# Patient Record
Sex: Female | Born: 1940 | Race: White | Hispanic: No | Smoking: Former smoker
Health system: Southern US, Community
[De-identification: ages and names within clinical notes are randomized; demographics above are authoritative.]

## PROBLEM LIST (undated history)

## (undated) DIAGNOSIS — C7A8 Other malignant neuroendocrine tumors: Secondary | ICD-10-CM

## (undated) DIAGNOSIS — C7B8 Other secondary neuroendocrine tumors: Secondary | ICD-10-CM

## (undated) DIAGNOSIS — K5792 Diverticulitis of intestine, part unspecified, without perforation or abscess without bleeding: Secondary | ICD-10-CM

---

## 2013-07-02 ENCOUNTER — Emergency Department (HOSPITAL_COMMUNITY): Payer: Medicare HMO

## 2013-07-02 ENCOUNTER — Other Ambulatory Visit: Payer: Self-pay

## 2013-07-02 ENCOUNTER — Inpatient Hospital Stay (HOSPITAL_COMMUNITY)
Admission: EM | Admit: 2013-07-02 | Discharge: 2013-07-11 | DRG: 981 | Disposition: A | Discharge status: Home Health | Payer: Medicare HMO | Attending: Neurosurgery | Admitting: Neurosurgery

## 2013-07-02 ENCOUNTER — Encounter (HOSPITAL_COMMUNITY): Payer: Self-pay | Admitting: Emergency Medicine

## 2013-07-02 DIAGNOSIS — D61818 Other pancytopenia: Secondary | ICD-10-CM

## 2013-07-02 DIAGNOSIS — Z87891 Personal history of nicotine dependence: Secondary | ICD-10-CM

## 2013-07-02 DIAGNOSIS — D649 Anemia, unspecified: Secondary | ICD-10-CM | POA: Diagnosis not present

## 2013-07-02 DIAGNOSIS — I10 Essential (primary) hypertension: Secondary | ICD-10-CM | POA: Diagnosis present

## 2013-07-02 DIAGNOSIS — J9600 Acute respiratory failure, unspecified whether with hypoxia or hypercapnia: Secondary | ICD-10-CM

## 2013-07-02 DIAGNOSIS — R7309 Other abnormal glucose: Secondary | ICD-10-CM | POA: Diagnosis not present

## 2013-07-02 DIAGNOSIS — C7800 Secondary malignant neoplasm of unspecified lung: Secondary | ICD-10-CM | POA: Diagnosis present

## 2013-07-02 DIAGNOSIS — C719 Malignant neoplasm of brain, unspecified: Secondary | ICD-10-CM

## 2013-07-02 DIAGNOSIS — C787 Secondary malignant neoplasm of liver and intrahepatic bile duct: Secondary | ICD-10-CM | POA: Diagnosis present

## 2013-07-02 DIAGNOSIS — R0602 Shortness of breath: Secondary | ICD-10-CM | POA: Diagnosis present

## 2013-07-02 DIAGNOSIS — Z79899 Other long term (current) drug therapy: Secondary | ICD-10-CM

## 2013-07-02 DIAGNOSIS — R1310 Dysphagia, unspecified: Secondary | ICD-10-CM | POA: Diagnosis not present

## 2013-07-02 DIAGNOSIS — C781 Secondary malignant neoplasm of mediastinum: Secondary | ICD-10-CM | POA: Diagnosis present

## 2013-07-02 DIAGNOSIS — R918 Other nonspecific abnormal finding of lung field: Secondary | ICD-10-CM | POA: Diagnosis present

## 2013-07-02 DIAGNOSIS — J438 Other emphysema: Secondary | ICD-10-CM | POA: Diagnosis present

## 2013-07-02 DIAGNOSIS — C7970 Secondary malignant neoplasm of unspecified adrenal gland: Secondary | ICD-10-CM | POA: Diagnosis present

## 2013-07-02 DIAGNOSIS — C3430 Malignant neoplasm of lower lobe, unspecified bronchus or lung: Principal | ICD-10-CM | POA: Diagnosis present

## 2013-07-02 DIAGNOSIS — T380X5A Adverse effect of glucocorticoids and synthetic analogues, initial encounter: Secondary | ICD-10-CM | POA: Diagnosis not present

## 2013-07-02 DIAGNOSIS — C779 Secondary and unspecified malignant neoplasm of lymph node, unspecified: Secondary | ICD-10-CM | POA: Diagnosis present

## 2013-07-02 DIAGNOSIS — E43 Unspecified severe protein-calorie malnutrition: Secondary | ICD-10-CM | POA: Insufficient documentation

## 2013-07-02 DIAGNOSIS — G911 Obstructive hydrocephalus: Secondary | ICD-10-CM | POA: Diagnosis present

## 2013-07-02 DIAGNOSIS — IMO0002 Reserved for concepts with insufficient information to code with codable children: Secondary | ICD-10-CM

## 2013-07-02 DIAGNOSIS — E46 Unspecified protein-calorie malnutrition: Secondary | ICD-10-CM

## 2013-07-02 DIAGNOSIS — I871 Compression of vein: Secondary | ICD-10-CM | POA: Diagnosis present

## 2013-07-02 DIAGNOSIS — I498 Other specified cardiac arrhythmias: Secondary | ICD-10-CM | POA: Diagnosis present

## 2013-07-02 DIAGNOSIS — C7949 Secondary malignant neoplasm of other parts of nervous system: Secondary | ICD-10-CM

## 2013-07-02 DIAGNOSIS — C799 Secondary malignant neoplasm of unspecified site: Secondary | ICD-10-CM | POA: Diagnosis present

## 2013-07-02 DIAGNOSIS — E871 Hypo-osmolality and hyponatremia: Secondary | ICD-10-CM | POA: Diagnosis present

## 2013-07-02 DIAGNOSIS — Z8719 Personal history of other diseases of the digestive system: Secondary | ICD-10-CM

## 2013-07-02 DIAGNOSIS — C7931 Secondary malignant neoplasm of brain: Secondary | ICD-10-CM | POA: Diagnosis present

## 2013-07-02 DIAGNOSIS — R000 Tachycardia, unspecified: Secondary | ICD-10-CM | POA: Diagnosis present

## 2013-07-02 DIAGNOSIS — J439 Emphysema, unspecified: Secondary | ICD-10-CM | POA: Diagnosis present

## 2013-07-02 DIAGNOSIS — G936 Cerebral edema: Secondary | ICD-10-CM | POA: Diagnosis present

## 2013-07-02 HISTORY — DX: Diverticulitis of intestine, part unspecified, without perforation or abscess without bleeding: K57.92

## 2013-07-02 LAB — COMPREHENSIVE METABOLIC PANEL
ALT: 27 U/L (ref 0–35)
AST: 85 U/L — ABNORMAL HIGH (ref 0–37)
Albumin: 3.7 g/dL (ref 3.5–5.2)
Alkaline Phosphatase: 260 U/L — ABNORMAL HIGH (ref 39–117)
BUN: 20 mg/dL (ref 6–23)
CALCIUM: 11.4 mg/dL — AB (ref 8.4–10.5)
CO2: 21 mEq/L (ref 19–32)
Chloride: 90 mEq/L — ABNORMAL LOW (ref 96–112)
Creatinine, Ser: 0.73 mg/dL (ref 0.50–1.10)
GFR calc Af Amer: >90 mL/min (ref >90)
GFR calc non Af Amer: 82 mL/min — ABNORMAL LOW (ref >90)
Glucose, Bld: 96 mg/dL (ref 70–99)
POTASSIUM: 4.8 meq/L (ref 3.7–5.3)
Sodium: 131 mEq/L — ABNORMAL LOW (ref 137–147)
TOTAL PROTEIN: 8.2 g/dL (ref 6.0–8.3)
Total Bilirubin: 0.6 mg/dL (ref 0.3–1.2)

## 2013-07-02 LAB — CBC WITH DIFFERENTIAL/PLATELET
BASOS PCT: 0 % (ref 0–1)
Basophils Absolute: 0 10*3/uL (ref 0.0–0.1)
EOS ABS: 0.1 10*3/uL (ref 0.0–0.7)
EOS PCT: 1 % (ref 0–5)
HEMATOCRIT: 36.8 % (ref 36.0–46.0)
Hemoglobin: 12.9 g/dL (ref 12.0–15.0)
LYMPHS ABS: 1.8 10*3/uL (ref 0.7–4.0)
Lymphocytes Relative: 14 % (ref 12–46)
MCH: 30.4 pg (ref 26.0–34.0)
MCHC: 35.1 g/dL (ref 30.0–36.0)
MCV: 86.8 fL (ref 78.0–100.0)
MONOS PCT: 14 % — AB (ref 3–12)
Monocytes Absolute: 1.7 10*3/uL — ABNORMAL HIGH (ref 0.1–1.0)
NEUTROS PCT: 71 % (ref 43–77)
Neutro Abs: 9 10*3/uL — ABNORMAL HIGH (ref 1.7–7.7)
Platelets: 416 10*3/uL — ABNORMAL HIGH (ref 150–400)
RBC: 4.24 MIL/uL (ref 3.87–5.11)
RDW: 13.8 % (ref 11.5–15.5)
WBC: 12.6 10*3/uL — AB (ref 4.0–10.5)

## 2013-07-02 LAB — LIPASE, BLOOD: LIPASE: 73 U/L — AB (ref 11–59)

## 2013-07-02 LAB — I-STAT TROPONIN, ED: TROPONIN I, POC: 0 ng/mL (ref 0.00–0.08)

## 2013-07-02 LAB — PROTIME-INR
INR: 1.07 (ref 0.00–1.49)
PROTHROMBIN TIME: 13.7 s (ref 11.6–15.2)

## 2013-07-02 MED ORDER — IOHEXOL 350 MG/ML SOLN
80.0000 mL | Freq: Once | INTRAVENOUS | Status: AC | PRN
  Administered 2013-07-02: 80 mL via INTRAVENOUS

## 2013-07-02 MED ORDER — SODIUM CHLORIDE 0.9 % IJ SOLN
3.0000 mL | Freq: Two times a day (BID) | INTRAMUSCULAR | Status: DC
  Administered 2013-07-04 – 2013-07-11 (×12): 3 mL via INTRAVENOUS

## 2013-07-02 MED ORDER — SODIUM CHLORIDE 0.9 % IV SOLN
INTRAVENOUS | Status: AC
  Administered 2013-07-02 – 2013-07-03 (×2): via INTRAVENOUS

## 2013-07-02 MED ORDER — ONDANSETRON HCL 4 MG PO TABS
4.0000 mg | ORAL_TABLET | Freq: Four times a day (QID) | ORAL | Status: DC | PRN
  Administered 2013-07-04 – 2013-07-10 (×2): 4 mg via ORAL
  Filled 2013-07-02 (×2): qty 1

## 2013-07-02 MED ORDER — HYDROCODONE-ACETAMINOPHEN 5-325 MG PO TABS: 1.0000 | ORAL_TABLET | ORAL | Status: DC | PRN

## 2013-07-02 MED ORDER — ALUM & MAG HYDROXIDE-SIMETH 200-200-20 MG/5ML PO SUSP
30.0000 mL | Freq: Four times a day (QID) | ORAL | Status: DC | PRN
Start: 2013-07-02 — End: 2013-07-11

## 2013-07-02 MED ORDER — PANTOPRAZOLE SODIUM 40 MG PO TBEC
40.0000 mg | DELAYED_RELEASE_TABLET | Freq: Two times a day (BID) | ORAL | Status: DC
  Administered 2013-07-02 – 2013-07-06 (×8): 40 mg via ORAL
  Filled 2013-07-02 (×10): qty 1

## 2013-07-02 MED ORDER — SODIUM CHLORIDE 0.9 % IV BOLUS (SEPSIS)
1000.0000 mL | Freq: Once | INTRAVENOUS | Status: AC
  Administered 2013-07-02: 1000 mL via INTRAVENOUS

## 2013-07-02 MED ORDER — BUDESONIDE-FORMOTEROL FUMARATE 160-4.5 MCG/ACT IN AERO
2.0000 | INHALATION_SPRAY | Freq: Two times a day (BID) | RESPIRATORY_TRACT | Status: DC
  Administered 2013-07-02 – 2013-07-11 (×16): 2 via RESPIRATORY_TRACT
  Filled 2013-07-02 (×3): qty 6

## 2013-07-02 MED ORDER — ONDANSETRON HCL 4 MG/2ML IJ SOLN
4.0000 mg | Freq: Four times a day (QID) | INTRAMUSCULAR | Status: DC | PRN
  Administered 2013-07-04 – 2013-07-11 (×3): 4 mg via INTRAVENOUS
  Filled 2013-07-02 (×3): qty 2

## 2013-07-02 MED ORDER — SODIUM CHLORIDE 0.9 % IV SOLN: INTRAVENOUS | Status: DC

## 2013-07-02 NOTE — ED Notes (Signed)
EMTALA completed

## 2013-07-02 NOTE — ED Notes (Signed)
PT transported via [facility] at this time. Report given to transport.

## 2013-07-02 NOTE — H&P (Signed)
PCP:   [name], MD   Chief Complaint:  sob  HPI: 73 yo female overall healthy comes in with over a month of sob really worse in the last couple of days.  She has lost a lot of weight in last couple of months, not eating well due to lack of appetite and when she eats it makes her sob worse.  No fevers.  Her pcp ordered ct scan in the last week which showed a large lung mass and was told if her sob got worse to come to ED.  Denies any hemoptysis.  No le swelling or edema.  Is set up to see oncology as outpt.  Denies pain.  Review of Systems:  Positive and negative as per HPI otherwise all other systems are negative  Past Medical History: Past Medical History  Diagnosis Date  . Diverticulitis    History reviewed. No pertinent past surgical history.  Medications: Prior to Admission medications   Medication Sig Start Date End Date Taking? Authorizing Provider  budesonide-formoterol (SYMBICORT) 160-4.5 MCG/ACT inhaler Inhale 2 puffs into the lungs See admin instructions. Every 9 hours.   Yes Historical Provider, MD  dextromethorphan-guaiFENesin (MUCINEX DM) 30-600 MG per 12 hr tablet Take 1 tablet by mouth 2 (two) times daily.   Yes Historical Provider, MD  Ferrous Sulfate (IRON) 325 (65 FE) MG TABS Take 1 tablet by mouth 3 (three) times daily.   Yes Historical Provider, MD  pantoprazole (PROTONIX) 40 MG tablet Take 40 mg by mouth 2 (two) times daily.   Yes Historical Provider, MD  azithromycin (ZITHROMAX) 250 MG tablet Take 250-500 mg by mouth daily. Take two tablets on day 1 and 1 tablet for the next 4 days.    Historical Provider, MD    Allergies:  No Known Allergies  Social History:  reports that she has quit smoking. She does not have any smokeless tobacco history on file. Her alcohol and drug histories are not on file.  Family History: History reviewed. No pertinent family history.  Physical Exam: Filed Vitals:   07/02/13 1538 07/02/13 1615 07/02/13 1730  BP:  117/70 141/78 163/70  Pulse: 128  108  Temp: 97.9 F (36.6 C)    TempSrc: Oral    Resp: 22 34 22  Weight: 42.638 kg (94 lb)    SpO2: 96%  99%   General appearance: alert, cooperative and no distress Head: Normocephalic, without obvious abnormality, atraumatic Eyes: negative Nose: Nares normal. Septum midline. Mucosa normal. No drainage or sinus tenderness. Neck: no JVD and supple, symmetrical, trachea midline Lungs: clear to auscultation bilaterally Heart: regular rate and rhythm, S1, S2 normal, no murmur, click, rub or gallop Abdomen: soft, non-tender; bowel sounds normal; no masses,  no organomegaly Extremities: extremities normal, atraumatic, no cyanosis or edema Pulses: 2+ and symmetric Skin: Skin color, texture, turgor normal. No rashes or lesions Neurologic: Grossly normal    Labs on Admission:   Recent Labs  07/02/13 1541  NA 131*  K 4.8  CL 90*  CO2 21  GLUCOSE 96  BUN 20  CREATININE 0.73  CALCIUM 11.4*    Recent Labs  07/02/13 1541  AST 85*  ALT 27  ALKPHOS 260*  BILITOT 0.6  PROT 8.2  ALBUMIN 3.7    Recent Labs  07/02/13 1541  LIPASE 73*    Recent Labs  07/02/13 1541  WBC 12.6*  NEUTROABS 9.0*  HGB 12.9  HCT 36.8  MCV 86.8  PLT 416*    Radiological Exams on  Admission: Ct Angio Chest Pe W/cm &/or Wo Cm  07/02/2013   CLINICAL DATA Increased shortness of breath over the last 2 days.  Lung mass.  EXAM CT ANGIOGRAPHY CHEST WITH CONTRAST  TECHNIQUE Multidetector CT imaging of the chest was performed using the standard protocol during bolus administration of intravenous contrast. Multiplanar CT image reconstructions and MIPs were obtained to evaluate the vascular anatomy.  CONTRAST 22mL OMNIPAQUE IOHEXOL 350 MG/ML SOLN  COMPARISON Cancer prior  FINDINGS There is no acute vascular abnormality. Aortic and coronary artery atherosclerosis. Coronary artery atherosclerosis is present. If office based assessment of coronary risk factors has not been  performed, it is now recommended. No pulmonary embolism is identified.  There is mass effect on the pulmonary arteries associated with mediastinal adenopathy however there is no occlusion. Right hilar adenopathy occludes the low right lower lobe bronchi. There is a posterior medial basal right lower lobe mass likely representing a primary tumor measuring 5 cm x 8 cm. Mediastinal adenopathy is present with the dumbbell-shaped subcarinal node did measures 4 cm AP x 3 cm transverse. Prominent right hilar adenopathy is present. Right hilar lymph nodes are invading the right inferior pulmonary vein with areas of tumor thrombus along the superior wall of the pulmonary vein. Metastatic disease to the liver is present with large right hepatic lobe metastases, with the largest measuring 9 cm AP x 5.5 cm transverse.  Both adrenal glands appear enlarged, suspicious for adrenal metastasis. There is no pericardial effusion. No pleural effusion. Emphysema in the lungs. Micronodularity is present in the right lower lobe, likely representing endobronchial spread of tumor. Bilateral pleural apical scarring. There is no mass or convincing evidence metastasis in the left lung. No axillary adenopathy. The IVC is displaced anteriorly by peritracheal adenopathy, without complete occlusion. The left brachiocephalic vein is also displaced anteriorly. Osteopenia. No thoracic compression fractures. Mediastinal adenopathy extends up to the esophagus and could be associated with dysphagia.  Review of the MIP images confirms the above findings.  IMPRESSION 1. No acute abnormality. 2. Right lower lobe mass is favored to represent primary bronchogenic carcinoma with subcarinal/mediastinal and right hilar adenopathy. Invasion of the right inferior pulmonary vein. Mass effect on the right-greater-than-left pulmonary artery and right lower lobe bronchi. 3. Hepatic metastases. 4. Right-sided pulmonary nodules compatible with metastatic disease. 5.  Mass effect on the left brachiocephalic vein and superior vena cava without occlusion. 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 6. 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  SIGNATURE  Electronically Signed   By: Dereck Ligas M.D.   On: 07/02/2013 17:38    Assessment/Plan  73 yo female with progressive worsening sob/doe with new rll lung mass with probable metastatic disease  Principal Problem:   SOB (shortness of breath)-  Pt will be transferred to [facility] and kept npo after midnight for possible bx by pulm tomorrow.  Oncology also will see at [facility].  o2 sats good.  Place on tele.  ??svc syndrome, further w/u per pulm team.  Will hold dvt proph for possible bx in am.  Active Problems:   Lung mass   Metastasis   Sinus tachycardia   Hyponatremia   Metastatic lung carcinoma  FULL CODE discussed with pt and daughter.  Rachael Wall A 07/02/2013, 7:42 PM

## 2013-07-02 NOTE — ED Notes (Signed)
Ambulating spo2 97% HR increase from 114 to 130 pt c/o weakness while ambulating

## 2013-07-02 NOTE — Consult Note (Signed)
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 PCCM asked to see to assist w/ diagnostic modalities.   SIGNIFICANT EVENTS / STUDIES:  CT chest 3/11: . Right lower lobe mass is favored to represent primary bronchogenic carcinoma with subcarinal/mediastinal and right hilar adenopathy. Invasion of the right inferior pulmonary vein. Mass  effect on the right-greater-than-left pulmonary artery and right lower lobe bronchi. Hepatic metastases. Right-sided pulmonary nodules compatible with metastatic disease. Mass effect on the left brachiocephalic vein and superior vena  cava without occlusion. 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   LINES / TUBES:   CULTURES:  ANTIBIOTICS:   HISTORY OF PRESENT ILLNESS:   73 yo female overall healthy comes in 3/11 with over a month of sob really worse in the last couple of days prior to admit. She has lost 25 lbs in last 3 mo, not eating well due to lack of appetite and when she eats it makes her sob worse, and has difficulty swallowing. No fevers. Her pcp ordered ct scan in the last week before admit which showed: Right lower lobe mass is favored to represent primary bronchogenic carcinoma with subcarinal/mediastinal and right hilar adenopathy. Invasion of the right inferior pulmonary vein. Mass effect on the right-greater-than-left pulmonary artery and right lower lobe bronchi,Hepatic  metastases. Right-sided pulmonary nodules compatible with metastatic disease. Mass effect on the left brachiocephalic vein and superior vena. Heme/onc has been called. PCCM has been asked to see to assist with obtaining tissue dx.     PAST MEDICAL HISTORY :  Past Medical History  Diagnosis Date  . Diverticulitis    History reviewed. No pertinent past surgical history. Prior to Admission medications   Medication Sig Start Date End Date Taking? Authorizing Provider  budesonide-formoterol (SYMBICORT) 160-4.5 MCG/ACT inhaler Inhale 2 puffs into the lungs See admin instructions. Every 9 hours.   Yes Historical Provider, MD  dextromethorphan-guaiFENesin (MUCINEX DM) 30-600 MG per 12 hr tablet Take 1 tablet by mouth 2 (two) times daily.   Yes Historical Provider, MD  Ferrous Sulfate (IRON) 325 (65 FE) MG TABS Take 1 tablet by mouth 3 (three) times daily.   Yes Historical Provider, MD  pantoprazole (PROTONIX) 40 MG tablet Take 40 mg by mouth 2 (two) times daily.   Yes Historical Provider, MD  azithromycin (ZITHROMAX) 250 MG tablet Take 250-500 mg by mouth daily. Take two tablets on day 1 and 1 tablet for the next 4 days.    Historical Provider, MD   No Known Allergies  FAMILY HISTORY:  Brother died of lung cancer  SOCIAL HISTORY:  reports that she has quit smoking. She does not have any smokeless tobacco history on file. Her alcohol and drug histories are not on file. did smoke up until 1 month ago  Review of Systems:   Bolds are positive  Constitutional: weight loss, 25 lbs in 3 mo , gain, night sweats, Fevers, chills, fatigue .  HEENT: headaches, Sore  throat, sneezing, nasal congestion, post nasal drip, Difficulty swallowing, Tooth/dental problems, visual complaints visual changes, ear ache CV:  chest pain, radiates: ,Orthopnea, PND, swelling in lower extremities, dizziness, palpitations, syncope.  GI  heartburn, indigestion, abdominal pain, nausea, vomiting, diarrhea, change in bowel  habits, loss of appetite, bloody stools.  Resp: cough, w po intake, productive: , hemoptysis, dyspnea, over last mo chest pain, pleuritic.  Skin: rash or itching or icterus GU: dysuria, change in color of urine, urgency or frequency. flank pain, hematuria  MS: joint pain or swelling. decreased range of motion  Psych: change in mood or affect. depression or anxiety.  Neuro: difficulty with speech, weakness, numbness, ataxia    SUBJECTIVE:  No distress  VITAL SIGNS: Temp:  [97.6 F (36.4 C)-97.9 F (36.6 C)] 97.6 F (36.4 C) (03/11 2107) Pulse Rate:  [59-128] 104 (03/11 2107) Resp:  [20-34] 20 (03/11 2107) BP: (117-176)/(70-87) 154/80 mmHg (03/11 2107) SpO2:  [96 %-99 %] 97 % (03/11 2107) Weight:  [42.638 kg (94 lb)-44.453 kg (98 lb)] 44.453 kg (98 lb) (03/11 2100)  PHYSICAL EXAMINATION: General:  Frail white female, resting comfortably in no acute distress Neuro:  Awake, alert, no focal def  HEENT:  No sig JVD, mmm, [place]  Cardiovascular:  rrr Lungs:  CTA  Abdomen:  Non-tender  Musculoskeletal:  Intact  Skin:  Dry intact    Recent Labs Lab 07/02/13 1541  NA 131*  K 4.8  CL 90*  CO2 21  BUN 20  CREATININE 0.73  GLUCOSE 96    Recent Labs Lab 07/02/13 1541  HGB 12.9  HCT 36.8  WBC 12.6*  PLT 416*   Ct Angio Chest Pe W/cm &/or Wo Cm  07/02/2013   CLINICAL DATA Increased shortness of breath over the last 2 days.  Lung mass.  EXAM CT ANGIOGRAPHY CHEST WITH CONTRAST  TECHNIQUE Multidetector CT imaging of the chest was performed using the standard protocol during bolus administration of intravenous contrast. Multiplanar CT image reconstructions and MIPs were obtained to evaluate the vascular anatomy.  CONTRAST 65mL OMNIPAQUE IOHEXOL 350 MG/ML SOLN  COMPARISON Cancer prior  FINDINGS There is no acute vascular abnormality. Aortic and coronary artery atherosclerosis. Coronary artery atherosclerosis is present. If office based assessment of coronary risk factors has not been  performed, it is now recommended. No pulmonary embolism is identified.  There is mass effect on the pulmonary arteries associated with mediastinal adenopathy however there is no occlusion. Right hilar adenopathy occludes the low right lower lobe bronchi. There is a posterior medial basal right lower lobe mass likely representing a primary tumor measuring 5 cm x 8 cm. Mediastinal adenopathy is present with the dumbbell-shaped subcarinal node did measures 4 cm AP x 3 cm transverse. Prominent right hilar adenopathy is present. Right hilar lymph nodes are invading the right inferior pulmonary vein with areas of tumor thrombus along the superior wall of the pulmonary vein. Metastatic disease to the liver is present with large right hepatic lobe metastases, with the largest measuring 9 cm AP x 5.5 cm transverse.  Both adrenal glands appear enlarged, suspicious for adrenal metastasis. There is no pericardial effusion. No pleural effusion. Emphysema in the lungs. Micronodularity is present in the right lower lobe, likely representing endobronchial spread of tumor. Bilateral pleural apical scarring. There is no mass or convincing evidence metastasis in the left lung. No axillary adenopathy. The IVC is displaced anteriorly by peritracheal adenopathy, without complete occlusion. The left brachiocephalic vein is also displaced anteriorly. Osteopenia. No thoracic compression  fractures. Mediastinal adenopathy extends up to the esophagus and could be associated with dysphagia.  Review of the MIP images confirms the above findings.  IMPRESSION 1. No acute abnormality. 2. Right lower lobe mass is favored to represent primary bronchogenic carcinoma with subcarinal/mediastinal and right hilar adenopathy. Invasion of the right inferior pulmonary vein. Mass effect on the right-greater-than-left pulmonary artery and right lower lobe bronchi. 3. Hepatic metastases. 4. Right-sided pulmonary nodules compatible with metastatic disease. 5.  Mass effect on the left brachiocephalic vein and superior vena cava without occlusion. 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 6. 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  SIGNATURE  Electronically Signed   By: Dereck Ligas M.D.   On: 07/02/2013 17:38    ASSESSMENT / PLAN: 1) Right lower lobe lung mass w/ marked subcarinal and mediastinal LN involvement as well as radiographic evidence of SVC syndrome (Staff MD: No much clinical evidence) AND probable mets to liver and adrenals.  2) dyspnea d/t #1 3) dysphagia d/t #1 w/ the mediastinal adenopathy displacing the esophagus  Plan  NPO after midnight Plan for airway evaluation and tissue sampling via FOB  Further recs per heme/onc    Marni Griffon NP Pulmonary and [place] Pager: [phone] 07/02/2013, 9:23 PM  STAFF NOTE: I, Dr Ann Lions have personally reviewed patient's available data, including medical history, events of note, physical examination and test results as part of my evaluation. I have discussed with resident/NP and other care providers such as pharmacist, RN and RRT.  In addition,  I personally evaluated patient and elicited key findings of Superior with LIKELY LIVER AND ADRENAL METS. THere is radiographic evidence of SVC compression without occlusion but I do not think she has clinically significant SVC syndrome because she is sleeping flat without a problem. Therefore, would recommend tissue diagnosis first before proceeding to XRT/Chemo. Bronch wih TBNA would be suitable. EUS with adrenal bx (dr Oretha Caprice) will also give access to higher staging material. Given late hour these issues have to be sorted out 07/03/13 AM by both pulmonary and onc. Provisionally NPO; need to get a bronch slot.   Rest per NP/medical resident whose note is outlined above and that I agree with   Dr.  Brand Males, M.D., [facility].C.P Pulmonary and Critical Care Medicine Staff Physician [place] [facility] Pager: [phone], If no answer or between  15:00h - 7:00h: call [phone]  07/02/2013 11:25 PM

## 2013-07-02 NOTE — ED Notes (Signed)
Patient transported to CT and return

## 2013-07-02 NOTE — ED Notes (Signed)
Pt in c/o increased shortness of breath over the last two days, states she has been having problems with this recently and had a chest CT done and was told there was a mass in her lower lung, pt states when they called her with results and told her she would follow up with an oncologist she told them the shortness of breath was getting worse so they referred her to the emergency room. No distress noted at this time, speaking in full sentences. States she has had a cough but that it isn't new, denies fever.

## 2013-07-02 NOTE — ED Notes (Signed)
Report called to accepting RN at [facility] long

## 2013-07-02 NOTE — ED Provider Notes (Signed)
CSN: [id number]     Arrival date & time 07/02/13  1500 History   First MD Initiated Contact with Patient 07/02/13 1603     Chief Complaint  Patient presents with  . Shortness of Breath     (Consider location/radiation/quality/duration/timing/severity/associated sxs/prior Treatment) The history is provided by the patient.  Pebbles Zeiders is a 73 y.o. female hx of diverticulitis here with SOB. She was of breath with exertion for the last week and a half but worse over the last 2 days. She had a noncontrast CT chest that was done 2 days ago that showed a mass in the right lower lung. However shortness of breath got worse and also she has trouble sleeping last night. Denies any fevers or chills. She has a long smoking history in the past.    Past Medical History  Diagnosis Date  . Diverticulitis    History reviewed. No pertinent past surgical history. History reviewed. No pertinent family history. History  Substance Use Topics  . Smoking status: Former Research scientist (life sciences)  . Smokeless tobacco: Not on file  . Alcohol Use: Not on file   OB History   Grav Para Term Preterm Abortions TAB SAB Ect Mult Living                 Review of Systems  Respiratory: Positive for shortness of breath.   All other systems reviewed and are negative.      Allergies  Review of patient's allergies indicates no known allergies.  Home Medications   Current Outpatient Rx  Name  Route  Sig  Dispense  Refill  . budesonide-formoterol (SYMBICORT) 160-4.5 MCG/ACT inhaler   Inhalation   Inhale 2 puffs into the lungs See admin instructions. Every 9 hours.         Marland Kitchen dextromethorphan-guaiFENesin (MUCINEX DM) 30-600 MG per 12 hr tablet   Oral   Take 1 tablet by mouth 2 (two) times daily.         . Ferrous Sulfate (IRON) 325 (65 FE) MG TABS   Oral   Take 1 tablet by mouth 3 (three) times daily.         . pantoprazole (PROTONIX) 40 MG tablet   Oral   Take 40 mg by mouth 2 (two) times daily.         Marland Kitchen  azithromycin (ZITHROMAX) 250 MG tablet   Oral   Take 250-500 mg by mouth daily. Take two tablets on day 1 and 1 tablet for the next 4 days.          BP 163/70  Pulse 108  Temp(Src) 97.9 F (36.6 C) (Oral)  Resp 22  Wt 94 lb (42.638 kg)  SpO2 99% Physical Exam  Nursing note and vitals reviewed. Constitutional: She is oriented to person, place, and time.  Tachypneic,   HENT:  Head: Normocephalic.  Mouth/Throat: Oropharynx is clear and moist.  Eyes: Conjunctivae are normal. Pupils are equal, round, and reactive to light.  Neck: Normal range of motion. Neck supple.  Cardiovascular: Regular rhythm and normal heart sounds.   Tachycardic   Pulmonary/Chest:  Tachypneic, dec breath sounds R base   Abdominal: Soft. Bowel sounds are normal. She exhibits no distension. There is no tenderness. There is no rebound and no guarding.  Musculoskeletal: Normal range of motion. She exhibits no edema and no tenderness.  Neurological: She is alert and oriented to person, place, and time. No cranial nerve deficit. Coordination normal.  Skin: Skin is dry.  Psychiatric: She has a  normal mood and affect. Her behavior is normal. Judgment and thought content normal.    ED Course  Procedures (including critical care time) Labs Review Labs Reviewed  CBC WITH DIFFERENTIAL - Abnormal; Notable for the following:    WBC 12.6 (*)    Platelets 416 (*)    Neutro Abs 9.0 (*)    Monocytes Relative 14 (*)    Monocytes Absolute 1.7 (*)    All other components within normal limits  COMPREHENSIVE METABOLIC PANEL - Abnormal; Notable for the following:    Sodium 131 (*)    Chloride 90 (*)    Calcium 11.4 (*)    AST 85 (*)    Alkaline Phosphatase 260 (*)    GFR calc non Af Amer 82 (*)    All other components within normal limits  LIPASE, BLOOD - Abnormal; Notable for the following:    Lipase 73 (*)    All other components within normal limits  PROTIME-INR  I-STAT TROPOININ, ED   Imaging Review Ct Angio  Chest Pe W/cm &/or Wo Cm  07/02/2013   CLINICAL DATA Increased shortness of breath over the last 2 days.  Lung mass.  EXAM CT ANGIOGRAPHY CHEST WITH CONTRAST  TECHNIQUE Multidetector CT imaging of the chest was performed using the standard protocol during bolus administration of intravenous contrast. Multiplanar CT image reconstructions and MIPs were obtained to evaluate the vascular anatomy.  CONTRAST 62mL OMNIPAQUE IOHEXOL 350 MG/ML SOLN  COMPARISON Cancer prior  FINDINGS There is no acute vascular abnormality. Aortic and coronary artery atherosclerosis. Coronary artery atherosclerosis is present. If office based assessment of coronary risk factors has not been performed, it is now recommended. No pulmonary embolism is identified.  There is mass effect on the pulmonary arteries associated with mediastinal adenopathy however there is no occlusion. Right hilar adenopathy occludes the low right lower lobe bronchi. There is a posterior medial basal right lower lobe mass likely representing a primary tumor measuring 5 cm x 8 cm. Mediastinal adenopathy is present with the dumbbell-shaped subcarinal node did measures 4 cm AP x 3 cm transverse. Prominent right hilar adenopathy is present. Right hilar lymph nodes are invading the right inferior pulmonary vein with areas of tumor thrombus along the superior wall of the pulmonary vein. Metastatic disease to the liver is present with large right hepatic lobe metastases, with the largest measuring 9 cm AP x 5.5 cm transverse.  Both adrenal glands appear enlarged, suspicious for adrenal metastasis. There is no pericardial effusion. No pleural effusion. Emphysema in the lungs. Micronodularity is present in the right lower lobe, likely representing endobronchial spread of tumor. Bilateral pleural apical scarring. There is no mass or convincing evidence metastasis in the left lung. No axillary adenopathy. The IVC is displaced anteriorly by peritracheal adenopathy, without  complete occlusion. The left brachiocephalic vein is also displaced anteriorly. Osteopenia. No thoracic compression fractures. Mediastinal adenopathy extends up to the esophagus and could be associated with dysphagia.  Review of the MIP images confirms the above findings.  IMPRESSION 1. No acute abnormality. 2. Right lower lobe mass is favored to represent primary bronchogenic carcinoma with subcarinal/mediastinal and right hilar adenopathy. Invasion of the right inferior pulmonary vein. Mass effect on the right-greater-than-left pulmonary artery and right lower lobe bronchi. 3. Hepatic metastases. 4. Right-sided pulmonary nodules compatible with metastatic disease. 5. Mass effect on the left brachiocephalic vein and superior vena cava without occlusion. 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 6. 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  SIGNATURE  Electronically Signed   By: Dereck Ligas M.D.   On: 07/02/2013 17:38     EKG Interpretation   Date/Time:  Wednesday July 02 2013 15:39:08 EDT Ventricular Rate:  128 PR Interval:  128 QRS Duration: 70 QT Interval:  298 QTC Calculation: 435 R Axis:   -71 Text Interpretation:  Sinus tachycardia Biatrial enlargement Left axis  deviation Pulmonary disease pattern Abnormal ECG No previous ECGs  available Confirmed by Aerik Polan  MD, Audon Heymann ([id number]) on 07/02/2013 5:05:14 PM      MDM   Final diagnoses:  None  Keshonda Monsour is a 73 y.o. female here with SOB on exertion. Tachycardic here. Concerned for PE. Will do ct angio chest.   7 PM  CT showed no obvious PE but has metastatic lung cancer with SVC syndrome. She becomes tachycardic to 130 when ambulating so I don't think she is safe for d/c.   7:40 PM I called hospitalist for admission. Dr. Shanon Brow recommend oncology consult. I called Dr. Alvy Bimler, who recommend admission to [facility] and arrange for biopsy before  oncology will see patient. I called pulmonary, who will arrange for bronchoscopy. Will transfer to [place].     Wandra Arthurs, MD 07/02/13 [phone]

## 2013-07-03 ENCOUNTER — Encounter (HOSPITAL_COMMUNITY): Payer: Self-pay | Admitting: Radiology

## 2013-07-03 ENCOUNTER — Inpatient Hospital Stay (HOSPITAL_COMMUNITY): Payer: Medicare HMO

## 2013-07-03 DIAGNOSIS — J439 Emphysema, unspecified: Secondary | ICD-10-CM | POA: Diagnosis present

## 2013-07-03 DIAGNOSIS — R0602 Shortness of breath: Secondary | ICD-10-CM

## 2013-07-03 DIAGNOSIS — E46 Unspecified protein-calorie malnutrition: Secondary | ICD-10-CM | POA: Diagnosis present

## 2013-07-03 DIAGNOSIS — C7800 Secondary malignant neoplasm of unspecified lung: Secondary | ICD-10-CM

## 2013-07-03 LAB — BASIC METABOLIC PANEL
BUN: 17 mg/dL (ref 6–23)
CALCIUM: 9.9 mg/dL (ref 8.4–10.5)
CO2: 21 meq/L (ref 19–32)
CREATININE: 0.61 mg/dL (ref 0.50–1.10)
Chloride: 97 mEq/L (ref 96–112)
GFR calc Af Amer: >90 mL/min (ref >90)
GFR calc non Af Amer: 87 mL/min — ABNORMAL LOW (ref >90)
Glucose, Bld: 81 mg/dL (ref 70–99)
Potassium: 4.5 mEq/L (ref 3.7–5.3)
Sodium: 135 mEq/L — ABNORMAL LOW (ref 137–147)

## 2013-07-03 LAB — CBC
HCT: 30.6 % — ABNORMAL LOW (ref 36.0–46.0)
Hemoglobin: 10.1 g/dL — ABNORMAL LOW (ref 12.0–15.0)
MCH: 28.9 pg (ref 26.0–34.0)
MCHC: 33 g/dL (ref 30.0–36.0)
MCV: 87.4 fL (ref 78.0–100.0)
PLATELETS: 356 10*3/uL (ref 150–400)
RBC: 3.5 MIL/uL — ABNORMAL LOW (ref 3.87–5.11)
RDW: 13.8 % (ref 11.5–15.5)
WBC: 9.6 10*3/uL (ref 4.0–10.5)

## 2013-07-03 LAB — PROTIME-INR
INR: 1.22 (ref 0.00–1.49)
Prothrombin Time: 15.1 seconds (ref 11.6–15.2)

## 2013-07-03 MED ORDER — FENTANYL CITRATE 0.05 MG/ML IJ SOLN
INTRAMUSCULAR | Status: AC
  Filled 2013-07-03: qty 2

## 2013-07-03 MED ORDER — FENTANYL CITRATE 0.05 MG/ML IJ SOLN
INTRAMUSCULAR | Status: AC | PRN
  Administered 2013-07-03: 100 ug via INTRAVENOUS

## 2013-07-03 MED ORDER — MIDAZOLAM HCL 2 MG/2ML IJ SOLN
INTRAMUSCULAR | Status: AC
  Filled 2013-07-03: qty 2

## 2013-07-03 MED ORDER — MIDAZOLAM HCL 2 MG/2ML IJ SOLN
INTRAMUSCULAR | Status: AC | PRN
  Administered 2013-07-03: 1 mg via INTRAVENOUS

## 2013-07-03 NOTE — Care Management Note (Signed)
    Page 1 of 1   07/03/2013     3:09:33 PM   CARE MANAGEMENT NOTE 07/03/2013  Patient:  Rachael Wall, Rachael Wall   Account Number:  [id number]  Date Initiated:  07/03/2013  Documentation initiated by:  Dessa Phi  Subjective/Objective Assessment:   73 Y/O F ADMITTED W/SOB.     Action/Plan:   FROM HOME.HAS PCP,PHARMACY.   Anticipated DC Date:  07/07/2013   Anticipated DC Plan:  [place]  CM consult      Choice offered to / List presented to:             Status of service:  In process, will continue to follow Medicare Important Message given?   (If response is "NO", the following Medicare IM given date fields will be blank) Date Medicare IM given:   Date Additional Medicare IM given:    Discharge Disposition:    Per UR Regulation:  Reviewed for med. necessity/level of care/duration of stay  If discussed at Long Length of Stay Meetings, dates discussed:    Comments:  07/03/13 Louden Houseworth RN,BSN NCM [place].IR-BX,?METS.

## 2013-07-03 NOTE — Progress Notes (Signed)
PROGRESS NOTE  Rachael Wall WJX:[id number] DOB: 12-26-1940 DOA: 07/02/2013 PCP: [name], MD  HPI: 73 yo female overall healthy comes in with over a month of sob really worse in the last couple of days. She has lost a lot of weight in last couple of months, not eating well due to lack of appetite and when she eats it makes her sob worse. No fevers. Her pcp ordered ct scan in the last week which showed a large lung mass and was told if her sob got worse to come to ED. Denies any hemoptysis.  Assessment/Plan: Progressive dyspnea on exertion - this is likely due to metastatic disease in the setting of new lung mass. Right lower lobe mass - per CT scan favored to represent primary bronchogenic carcinoma, appears to be metastatic liver metastases. Pulmonology has been consulted for consideration of bronch with biopsy; it seems like she will undergo liver biopsy today per interventional radiology and depending on the results and consider bronchoscopy in the future if needed. I consulted oncology and talked with Dr. Humphrey Rolls who suggested patient be seen and establish with Dr. Earlie Server. Unfortunately her right lower lobe mass his invasion of the right inferior pulmonary vein and has a mass effect on pulmonary artery and right lower lobe bronchi SVC syndrome - clinically less evident Tobacco abuse, in remission - patient quit few months ago due to #1.  Diet: N.p.o. pending biopsy Fluids: None DVT Prophylaxis: SCDs  Code Status: Full Family Communication: d/w patient Disposition Plan: inpatient, home when ready  Consultants:  Pulmonology  Interventional radiology  Oncology  Procedures:  None   Antibiotics - None  HPI/Subjective: She is feeling well  Objective: Filed Vitals:   07/02/13 2100 07/02/13 2107 07/02/13 2203 07/03/13 0500  BP:  154/80  139/66  Pulse:  104  90  Temp:  97.6 F (36.4 C)  98.2 F (36.8 C)  TempSrc:  Oral  Oral  Resp:  20  20  Height: 4\' 11"   (1.499 m)     Weight: 44.453 kg (98 lb)   43.521 kg (95 lb 15.1 oz)  SpO2:  97% 97% 98%    Intake/Output Summary (Last 24 hours) at 07/03/13 0732 Last data filed at 07/03/13 0442  Gross per 24 hour  Intake 631.25 ml  Output      0 ml  Net 631.25 ml   Filed Weights   07/02/13 1538 07/02/13 2100 07/03/13 0500  Weight: 42.638 kg (94 lb) 44.453 kg (98 lb) 43.521 kg (95 lb 15.1 oz)    Exam:  General:  NAD  Cardiovascular: regular rate and rhythm, without MRG  Respiratory: good air movement, clear to auscultation throughout, no wheezing, ronchi or rales  Abdomen: soft, not tender to palpation, positive bowel sounds  MSK: no peripheral edema  Neuro: CN 2-12 grossly intact, MS 5/5 in all 4  Data Reviewed: Basic Metabolic Panel:  Recent Labs Lab 07/02/13 1541 07/03/13 0455  NA 131* 135*  K 4.8 4.5  CL 90* 97  CO2 21 21  GLUCOSE 96 81  BUN 20 17  CREATININE 0.73 0.61  CALCIUM 11.4* 9.9   Liver Function Tests:  Recent Labs Lab 07/02/13 1541  AST 85*  ALT 27  ALKPHOS 260*  BILITOT 0.6  PROT 8.2  ALBUMIN 3.7    Recent Labs Lab 07/02/13 1541  LIPASE 73*   CBC:  Recent Labs Lab 07/02/13 1541 07/03/13 0455  WBC 12.6* 9.6  NEUTROABS 9.0*  --   HGB 12.9  10.1*  HCT 36.8 30.6*  MCV 86.8 87.4  PLT 416* 356   Studies: Ct Angio Chest Pe W/cm &/or Wo Cm  07/02/2013   CLINICAL DATA Increased shortness of breath over the last 2 days.  Lung mass.  EXAM CT ANGIOGRAPHY CHEST WITH CONTRAST  TECHNIQUE Multidetector CT imaging of the chest was performed using the standard protocol during bolus administration of intravenous contrast. Multiplanar CT image reconstructions and MIPs were obtained to evaluate the vascular anatomy.  CONTRAST 31mL OMNIPAQUE IOHEXOL 350 MG/ML SOLN  COMPARISON Cancer prior  FINDINGS There is no acute vascular abnormality. Aortic and coronary artery atherosclerosis. Coronary artery atherosclerosis is present. If office based assessment of  coronary risk factors has not been performed, it is now recommended. No pulmonary embolism is identified.  There is mass effect on the pulmonary arteries associated with mediastinal adenopathy however there is no occlusion. Right hilar adenopathy occludes the low right lower lobe bronchi. There is a posterior medial basal right lower lobe mass likely representing a primary tumor measuring 5 cm x 8 cm. Mediastinal adenopathy is present with the dumbbell-shaped subcarinal node did measures 4 cm AP x 3 cm transverse. Prominent right hilar adenopathy is present. Right hilar lymph nodes are invading the right inferior pulmonary vein with areas of tumor thrombus along the superior wall of the pulmonary vein. Metastatic disease to the liver is present with large right hepatic lobe metastases, with the largest measuring 9 cm AP x 5.5 cm transverse.  Both adrenal glands appear enlarged, suspicious for adrenal metastasis. There is no pericardial effusion. No pleural effusion. Emphysema in the lungs. Micronodularity is present in the right lower lobe, likely representing endobronchial spread of tumor. Bilateral pleural apical scarring. There is no mass or convincing evidence metastasis in the left lung. No axillary adenopathy. The IVC is displaced anteriorly by peritracheal adenopathy, without complete occlusion. The left brachiocephalic vein is also displaced anteriorly. Osteopenia. No thoracic compression fractures. Mediastinal adenopathy extends up to the esophagus and could be associated with dysphagia.  Review of the MIP images confirms the above findings.  IMPRESSION 1. No acute abnormality. 2. Right lower lobe mass is favored to represent primary bronchogenic carcinoma with subcarinal/mediastinal and right hilar adenopathy. Invasion of the right inferior pulmonary vein. Mass effect on the right-greater-than-left pulmonary artery and right lower lobe bronchi. 3. Hepatic metastases. 4. Right-sided pulmonary nodules  compatible with metastatic disease. 5. Mass effect on the left brachiocephalic vein and superior vena cava without occlusion. 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 6. 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  SIGNATURE  Electronically Signed   By: Dereck Ligas M.D.   On: 07/02/2013 17:38    Scheduled Meds: . budesonide-formoterol  2 puff Inhalation BID  . pantoprazole  40 mg Oral BID  . sodium chloride  3 mL Intravenous Q12H   Continuous Infusions: . sodium chloride 75 mL/hr at 07/02/13 2153    Principal Problem:   SOB (shortness of breath) Active Problems:   Lung mass   Metastasis   Sinus tachycardia   Hyponatremia   Metastatic lung carcinoma   Time spent: 35  This note has been created with Surveyor, quantity. Any transcriptional errors are unintentional.   Marzetta Board, MD [facility] Pager [phone]. If 7 PM - 7 AM, please contact night-coverage at [email or web address], password [facility] 07/03/2013, 7:32 AM  LOS: 1 day

## 2013-07-03 NOTE — Procedures (Signed)
US core liver lesion 18g x3 to surg path No complication No blood loss. See complete dictation in [facility] PACS.  

## 2013-07-03 NOTE — Progress Notes (Signed)
Name: Rachael Wall MRN: [id number] DOB: January 27, 1941    ADMISSION DATE:  07/02/2013 CONSULTATION DATE:  3/11  REFERRING MD :  Shanon Brow  PRIMARY SERVICE:  [facility]   CHIEF COMPLAINT:  Lung mass  BRIEF PATIENT DESCRIPTION: 73 year old female admitted on 3/11 w/ ~ 1 mo h/o progressive dyspnea and dysphagia in setting of newly identified: Right lower lobe lung mass w/ marked subcarinal, mediastinal involvement, SVC syndrome and what appears to be mets to: liver and adrenals. PCCM asked to see to assist w/ diagnostic modalities.   SIGNIFICANT EVENTS / STUDIES:  3/11 CT Chest >>Rt lower lung mass, massive paratracheal/subcarinal adenopathy, Rt hilar adenopathy, multiple large liver mets, b/l adrenal enlargement, invasion of Rt inferior pulmonary vein, emphysema  PATHOLOGY:  3/12 Liver Bx>>>  SUBJECTIVE: Pt denies acute c/o's, reports no appetite.    VITAL SIGNS: Temp:  [97.6 F (36.4 C)-98.2 F (36.8 C)] 98.2 F (36.8 C) (03/12 0500) Pulse Rate:  [59-128] 90 (03/12 0500) Resp:  [20-34] 20 (03/12 0500) BP: (117-176)/(66-87) 139/66 mmHg (03/12 0500) SpO2:  [96 %-99 %] 98 % (03/12 0500) Weight:  [94 lb (42.638 kg)-98 lb (44.453 kg)] 95 lb 15.1 oz (43.521 kg) (03/12 0500)  PHYSICAL EXAMINATION: General:  Frail white female, resting comfortably in no acute distress Neuro:  Awake, alert, no focal def  HEENT:  No sig JVD, mm moist, Simms  Cardiovascular:  rrr Lungs:  CTA  Abdomen:  Non-tender  Musculoskeletal:  Intact  Skin:  Dry intact    Recent Labs Lab 07/02/13 1541 07/03/13 0455  NA 131* 135*  K 4.8 4.5  CL 90* 97  CO2 21 21  BUN 20 17  CREATININE 0.73 0.61  GLUCOSE 96 81    Recent Labs Lab 07/02/13 1541 07/03/13 0455  HGB 12.9 10.1*  HCT 36.8 30.6*  WBC 12.6* 9.6  PLT 416* 356   Ct Angio Chest Pe W/cm &/or Wo Cm  07/02/2013   CLINICAL DATA Increased shortness of breath over the last 2 days.  Lung mass.  EXAM CT ANGIOGRAPHY CHEST WITH CONTRAST  TECHNIQUE Multidetector CT  imaging of the chest was performed using the standard protocol during bolus administration of intravenous contrast. Multiplanar CT image reconstructions and MIPs were obtained to evaluate the vascular anatomy.  CONTRAST 17mL OMNIPAQUE IOHEXOL 350 MG/ML SOLN  COMPARISON Cancer prior  FINDINGS There is no acute vascular abnormality. Aortic and coronary artery atherosclerosis. Coronary artery atherosclerosis is present. If office based assessment of coronary risk factors has not been performed, it is now recommended. No pulmonary embolism is identified.  There is mass effect on the pulmonary arteries associated with mediastinal adenopathy however there is no occlusion. Right hilar adenopathy occludes the low right lower lobe bronchi. There is a posterior medial basal right lower lobe mass likely representing a primary tumor measuring 5 cm x 8 cm. Mediastinal adenopathy is present with the dumbbell-shaped subcarinal node did measures 4 cm AP x 3 cm transverse. Prominent right hilar adenopathy is present. Right hilar lymph nodes are invading the right inferior pulmonary vein with areas of tumor thrombus along the superior wall of the pulmonary vein. Metastatic disease to the liver is present with large right hepatic lobe metastases, with the largest measuring 9 cm AP x 5.5 cm transverse.  Both adrenal glands appear enlarged, suspicious for adrenal metastasis. There is no pericardial effusion. No pleural effusion. Emphysema in the lungs. Micronodularity is present in the right lower lobe, likely representing endobronchial spread of tumor. Bilateral pleural  apical scarring. There is no mass or convincing evidence metastasis in the left lung. No axillary adenopathy. The IVC is displaced anteriorly by peritracheal adenopathy, without complete occlusion. The left brachiocephalic vein is also displaced anteriorly. Osteopenia. No thoracic compression fractures. Mediastinal adenopathy extends up to the esophagus and could be  associated with dysphagia.  Review of the MIP images confirms the above findings.  IMPRESSION 1. No acute abnormality. 2. Right lower lobe mass is favored to represent primary bronchogenic carcinoma with subcarinal/mediastinal and right hilar adenopathy. Invasion of the right inferior pulmonary vein. Mass effect on the right-greater-than-left pulmonary artery and right lower lobe bronchi. 3. Hepatic metastases. 4. Right-sided pulmonary nodules compatible with metastatic disease. 5. Mass effect on the left brachiocephalic vein and superior vena cava without occlusion. 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 6. 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  SIGNATURE  Electronically Signed   By: Dereck Ligas M.D.   On: 07/02/2013 17:38    ASSESSMENT / PLAN: Dyspnea, dysphagia in setting of probable lung cancer (either extensive stage SCLC or stage IV NSCLC), and emphysema. Plan: IR evaluation for CT guided biopsy of presumed liver mets (highest yield for diagnosis & staging)  Await pathology Oncology Consult Consider SLP for diet recommendations Continue symbicort   Noe Gens, NP-C [facility] Pgr: [phone] or [phone]  Reviewed above.  Had detailed d/w pt and her daughter about CT findings.  Discussed different options of obtaining biopsy.  Easiest/safest initial approach with IR first.  They have assessed, and are planning needle biopsy of liver metastatic lesion.  This should provide diagnosis and staging as she likely will have either extensive stage SCLC or stage IV NSCLC.  If liver bx unrevealing will then re-address whether she should have bronchoscopy.  PCCM will follow in periphery pending liver bx results.  Chesley Mires, MD [facility] Pulmonary[facility]Critical Care 07/03/2013, 10:42 AM Pager:  [phone] After 3pm call: [phone]

## 2013-07-03 NOTE — Consult Note (Signed)
HPI: Rachael Wall is an 73 y.o. female admitted with SOB. Workup has found right lung mass suspicious for primary carcinoma. There is also large mediastinal/subcarinal and hilar adenopathy, as well as large hepatic and adrenal lesions concerning for metastatic disease. IR is asked to obtain tissue sample for diagnosis Images reviewed with Dr. Vernard Gambles, liver lesions are quite amenable to perc biopsy. Chart, PMHx, meds reviewed. She denies significant SOB this morning.  Past Medical History:  Past Medical History  Diagnosis Date  . Diverticulitis     Past Surgical History: History reviewed. No pertinent past surgical history.  Family History: History reviewed. No pertinent family history.  Social History:  reports that she has quit smoking. She does not have any smokeless tobacco history on file. Her alcohol and drug histories are not on file.  Allergies: No Known Allergies  Medications:   Medication List    ASK your doctor about these medications       azithromycin 250 MG tablet  Commonly known as:  ZITHROMAX  Take 250-500 mg by mouth daily. Take two tablets on day 1 and 1 tablet for the next 4 days.     budesonide-formoterol 160-4.5 MCG/ACT inhaler  Commonly known as:  SYMBICORT  Inhale 2 puffs into the lungs See admin instructions. Every 9 hours.     dextromethorphan-guaiFENesin 30-600 MG per 12 hr tablet  Commonly known as:  MUCINEX DM  Take 1 tablet by mouth 2 (two) times daily.     Iron 325 (65 FE) MG Tabs  Take 1 tablet by mouth 3 (three) times daily.     pantoprazole 40 MG tablet  Commonly known as:  PROTONIX  Take 40 mg by mouth 2 (two) times daily.        Please HPI for pertinent positives, otherwise complete 10 system ROS negative.  Physical Exam: BP 139/66  Pulse 90  Temp(Src) 98.2 F (36.8 C) (Oral)  Resp 20  Ht 4' 11"  (1.499 m)  Wt 95 lb 15.1 oz (43.521 kg)  BMI 19.37 kg/m2  SpO2 98% Body mass index is 19.37 kg/(m^2).   General Appearance:   Alert, cooperative, no distress, appears stated age  ENT: Unremarkable airway  Neck: Supple, symmetrical, trachea midline  Lungs:   Clear to auscultation bilaterally, no w/r/r,  Chest Wall:  No tenderness or deformity  Heart:  Regular rate and rhythm, S1, S2 normal, no murmur, rub or gallop.  Abdomen:   Soft, non-tender, non distended.  Neurologic: Normal affect, no gross deficits.   Results for orders placed during the hospital encounter of 07/02/13 (from the past 48 hour(s))  CBC WITH DIFFERENTIAL     Status: Abnormal   Collection Time    07/02/13  3:41 PM      Result Value Ref Range   WBC 12.6 (*) 4.0 - 10.5 K/uL   RBC 4.24  3.87 - 5.11 MIL/uL   Hemoglobin 12.9  12.0 - 15.0 g/dL   HCT 36.8  36.0 - 46.0 %   MCV 86.8  78.0 - 100.0 fL   MCH 30.4  26.0 - 34.0 pg   MCHC 35.1  30.0 - 36.0 g/dL   RDW 13.8  11.5 - 15.5 %   Platelets 416 (*) 150 - 400 K/uL   Neutrophils Relative % 71  43 - 77 %   Neutro Abs 9.0 (*) 1.7 - 7.7 K/uL   Lymphocytes Relative 14  12 - 46 %   Lymphs Abs 1.8  0.7 - 4.0 K/uL  Monocytes Relative 14 (*) 3 - 12 %   Monocytes Absolute 1.7 (*) 0.1 - 1.0 K/uL   Eosinophils Relative 1  0 - 5 %   Eosinophils Absolute 0.1  0.0 - 0.7 K/uL   Basophils Relative 0  0 - 1 %   Basophils Absolute 0.0  0.0 - 0.1 K/uL  COMPREHENSIVE METABOLIC PANEL     Status: Abnormal   Collection Time    07/02/13  3:41 PM      Result Value Ref Range   Sodium 131 (*) 137 - 147 mEq/L   Potassium 4.8  3.7 - 5.3 mEq/L   Chloride 90 (*) 96 - 112 mEq/L   CO2 21  19 - 32 mEq/L   Glucose, Bld 96  70 - 99 mg/dL   BUN 20  6 - 23 mg/dL   Creatinine, Ser 0.73  0.50 - 1.10 mg/dL   Calcium 11.4 (*) 8.4 - 10.5 mg/dL   Total Protein 8.2  6.0 - 8.3 g/dL   Albumin 3.7  3.5 - 5.2 g/dL   AST 85 (*) 0 - 37 U/L   ALT 27  0 - 35 U/L   Alkaline Phosphatase 260 (*) 39 - 117 U/L   Total Bilirubin 0.6  0.3 - 1.2 mg/dL   GFR calc non Af Amer 82 (*) >90 mL/min   GFR calc Af Amer >90  >90 mL/min   Comment:  (NOTE)     The eGFR has been calculated using the CKD EPI equation.     This calculation has not been validated in all clinical situations.     eGFR's persistently <90 mL/min signify possible Chronic Kidney     Disease.  LIPASE, BLOOD     Status: Abnormal   Collection Time    07/02/13  3:41 PM      Result Value Ref Range   Lipase 73 (*) 11 - 59 U/L  I-STAT TROPOININ, ED     Status: None   Collection Time    07/02/13  4:13 PM      Result Value Ref Range   Troponin i, poc 0.00  0.00 - 0.08 ng/mL   Comment 3            Comment: Due to the release kinetics of cTnI,     a negative result within the first hours     of the onset of symptoms does not rule out     myocardial infarction with certainty.     If myocardial infarction is still suspected,     repeat the test at appropriate intervals.  PROTIME-INR     Status: None   Collection Time    07/02/13  4:35 PM      Result Value Ref Range   Prothrombin Time 13.7  11.6 - 15.2 seconds   INR 1.07  0.00 - 7.74  BASIC METABOLIC PANEL     Status: Abnormal   Collection Time    07/03/13  4:55 AM      Result Value Ref Range   Sodium 135 (*) 137 - 147 mEq/L   Potassium 4.5  3.7 - 5.3 mEq/L   Chloride 97  96 - 112 mEq/L   CO2 21  19 - 32 mEq/L   Glucose, Bld 81  70 - 99 mg/dL   BUN 17  6 - 23 mg/dL   Creatinine, Ser 0.61  0.50 - 1.10 mg/dL   Calcium 9.9  8.4 - 10.5 mg/dL   GFR calc non Af  Amer 87 (*) >90 mL/min   GFR calc Af Amer >90  >90 mL/min   Comment: (NOTE)     The eGFR has been calculated using the CKD EPI equation.     This calculation has not been validated in all clinical situations.     eGFR's persistently <90 mL/min signify possible Chronic Kidney     Disease.  CBC     Status: Abnormal   Collection Time    07/03/13  4:55 AM      Result Value Ref Range   WBC 9.6  4.0 - 10.5 K/uL   RBC 3.50 (*) 3.87 - 5.11 MIL/uL   Hemoglobin 10.1 (*) 12.0 - 15.0 g/dL   HCT 30.6 (*) 36.0 - 46.0 %   MCV 87.4  78.0 - 100.0 fL   MCH 28.9   26.0 - 34.0 pg   MCHC 33.0  30.0 - 36.0 g/dL   RDW 13.8  11.5 - 15.5 %   Platelets 356  150 - 400 K/uL  PROTIME-INR     Status: None   Collection Time    07/03/13  4:55 AM      Result Value Ref Range   Prothrombin Time 15.1  11.6 - 15.2 seconds   INR 1.22  0.00 - 1.49   Ct Angio Chest Pe W/cm &/or Wo Cm  07/02/2013   CLINICAL DATA Increased shortness of breath over the last 2 days.  Lung mass.  EXAM CT ANGIOGRAPHY CHEST WITH CONTRAST  TECHNIQUE Multidetector CT imaging of the chest was performed using the standard protocol during bolus administration of intravenous contrast. Multiplanar CT image reconstructions and MIPs were obtained to evaluate the vascular anatomy.  CONTRAST 33m OMNIPAQUE IOHEXOL 350 MG/ML SOLN  COMPARISON Cancer prior  FINDINGS There is no acute vascular abnormality. Aortic and coronary artery atherosclerosis. Coronary artery atherosclerosis is present. If office based assessment of coronary risk factors has not been performed, it is now recommended. No pulmonary embolism is identified.  There is mass effect on the pulmonary arteries associated with mediastinal adenopathy however there is no occlusion. Right hilar adenopathy occludes the low right lower lobe bronchi. There is a posterior medial basal right lower lobe mass likely representing a primary tumor measuring 5 cm x 8 cm. Mediastinal adenopathy is present with the dumbbell-shaped subcarinal node did measures 4 cm AP x 3 cm transverse. Prominent right hilar adenopathy is present. Right hilar lymph nodes are invading the right inferior pulmonary vein with areas of tumor thrombus along the superior wall of the pulmonary vein. Metastatic disease to the liver is present with large right hepatic lobe metastases, with the largest measuring 9 cm AP x 5.5 cm transverse.  Both adrenal glands appear enlarged, suspicious for adrenal metastasis. There is no pericardial effusion. No pleural effusion. Emphysema in the lungs. Micronodularity  is present in the right lower lobe, likely representing endobronchial spread of tumor. Bilateral pleural apical scarring. There is no mass or convincing evidence metastasis in the left lung. No axillary adenopathy. The IVC is displaced anteriorly by peritracheal adenopathy, without complete occlusion. The left brachiocephalic vein is also displaced anteriorly. Osteopenia. No thoracic compression fractures. Mediastinal adenopathy extends up to the esophagus and could be associated with dysphagia.  Review of the MIP images confirms the above findings.  IMPRESSION 1. No acute abnormality. 2. Right lower lobe mass is favored to represent primary bronchogenic carcinoma with subcarinal/mediastinal and right hilar adenopathy. Invasion of the right inferior pulmonary vein. Mass effect on the right-greater-than-left pulmonary  artery and right lower lobe bronchi. 3. Hepatic metastases. 4. Right-sided pulmonary nodules compatible with metastatic disease. 5. Mass effect on the left brachiocephalic vein and superior vena cava without occlusion. 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 6. 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  SIGNATURE  Electronically Signed   By: Dereck Ligas M.D.   On: 07/02/2013 17:38    Assessment/Plan Lung mass, liver lesions For US guided liver lesion biopsy. Explained procedure, risks, complications, use of sedation. Labs reviewed. Consent signed in chart  Ascencion Dike PA-C 07/03/2013, 9:49 AM

## 2013-07-04 ENCOUNTER — Ambulatory Visit
Admit: 2013-07-04 | Discharge: 2013-07-04 | Disposition: A | Discharge status: Home / Self Care | Payer: Medicare HMO | Attending: Radiation Oncology | Admitting: Radiation Oncology

## 2013-07-04 ENCOUNTER — Inpatient Hospital Stay (HOSPITAL_COMMUNITY): Payer: Medicare HMO

## 2013-07-04 ENCOUNTER — Encounter: Payer: Self-pay | Admitting: Radiation Oncology

## 2013-07-04 DIAGNOSIS — C7800 Secondary malignant neoplasm of unspecified lung: Secondary | ICD-10-CM

## 2013-07-04 DIAGNOSIS — C787 Secondary malignant neoplasm of liver and intrahepatic bile duct: Secondary | ICD-10-CM

## 2013-07-04 DIAGNOSIS — Z87891 Personal history of nicotine dependence: Secondary | ICD-10-CM

## 2013-07-04 DIAGNOSIS — E43 Unspecified severe protein-calorie malnutrition: Secondary | ICD-10-CM | POA: Insufficient documentation

## 2013-07-04 DIAGNOSIS — R222 Localized swelling, mass and lump, trunk: Secondary | ICD-10-CM

## 2013-07-04 DIAGNOSIS — C7970 Secondary malignant neoplasm of unspecified adrenal gland: Secondary | ICD-10-CM

## 2013-07-04 LAB — BASIC METABOLIC PANEL
BUN: 15 mg/dL (ref 6–23)
CHLORIDE: 97 meq/L (ref 96–112)
CO2: 21 mEq/L (ref 19–32)
CREATININE: 0.54 mg/dL (ref 0.50–1.10)
Calcium: 9.6 mg/dL (ref 8.4–10.5)
GFR calc non Af Amer: >90 mL/min (ref >90)
Glucose, Bld: 79 mg/dL (ref 70–99)
Potassium: 4.1 mEq/L (ref 3.7–5.3)
Sodium: 134 mEq/L — ABNORMAL LOW (ref 137–147)

## 2013-07-04 LAB — CBC
HEMATOCRIT: 30.7 % — AB (ref 36.0–46.0)
Hemoglobin: 10.3 g/dL — ABNORMAL LOW (ref 12.0–15.0)
MCH: 29.3 pg (ref 26.0–34.0)
MCHC: 33.6 g/dL (ref 30.0–36.0)
MCV: 87.5 fL (ref 78.0–100.0)
PLATELETS: 328 10*3/uL (ref 150–400)
RBC: 3.51 MIL/uL — ABNORMAL LOW (ref 3.87–5.11)
RDW: 13.9 % (ref 11.5–15.5)
WBC: 9.4 10*3/uL (ref 4.0–10.5)

## 2013-07-04 MED ORDER — PRO-STAT SUGAR FREE PO LIQD
30.0000 mL | Freq: Every morning | ORAL | Status: DC
  Administered 2013-07-04: 30 mL via ORAL
  Filled 2013-07-04 (×5): qty 30

## 2013-07-04 MED ORDER — ENOXAPARIN SODIUM 30 MG/0.3ML ~~LOC~~ SOLN
30.0000 mg | SUBCUTANEOUS | Status: DC
  Administered 2013-07-04: 30 mg via SUBCUTANEOUS
  Filled 2013-07-04 (×2): qty 0.3

## 2013-07-04 MED ORDER — GADOBENATE DIMEGLUMINE 529 MG/ML IV SOLN
8.0000 mL | Freq: Once | INTRAVENOUS | Status: AC | PRN
  Administered 2013-07-04: 8 mL via INTRAVENOUS

## 2013-07-04 MED ORDER — RESOURCE INSTANT PROTEIN PO PWD PACKET
1.0000 | Freq: Three times a day (TID) | ORAL | Status: DC
  Administered 2013-07-05: 6 g via ORAL
  Filled 2013-07-04 (×10): qty 6

## 2013-07-04 MED ORDER — BENEPROTEIN PO POWD
1.0000 | Freq: Three times a day (TID) | ORAL | Status: DC
  Filled 2013-07-04: qty 227

## 2013-07-04 MED ORDER — DEXAMETHASONE SODIUM PHOSPHATE 4 MG/ML IJ SOLN
4.0000 mg | Freq: Four times a day (QID) | INTRAMUSCULAR | Status: DC
  Administered 2013-07-05 – 2013-07-07 (×8): 4 mg via INTRAVENOUS
  Filled 2013-07-04 (×14): qty 1

## 2013-07-04 MED ORDER — BOOST PLUS PO LIQD
237.0000 mL | Freq: Three times a day (TID) | ORAL | Status: DC
  Administered 2013-07-04 – 2013-07-10 (×13): 237 mL via ORAL
  Filled 2013-07-04 (×24): qty 237

## 2013-07-04 MED ORDER — DEXAMETHASONE SODIUM PHOSPHATE 10 MG/ML IJ SOLN
20.0000 mg | Freq: Once | INTRAMUSCULAR | Status: AC
  Administered 2013-07-04: 20 mg via INTRAVENOUS
  Filled 2013-07-04: qty 5

## 2013-07-04 NOTE — Consult Note (Signed)
Reason for Consult: Cerebellar metastasis, hydrocephalus Referring Physician: Dr. Garret Reddish Rachael Wall is an 73 y.o. female.  HPI: The patient is a 72 year old white female who was admitted with shortness of breath. She underwent a CT of the chest abdomen and pelvis which demonstrated a presumed primary lung cancer with pulmonary and mediastinal metastasis as well as metastasis to her liver and adrenal glands. Patient underwent further staging workup including a brain MRI. 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 A neurosurgical consultation was requested. The patient had a biopsy of a liver lesion yesterday. The pathology is not back yet.  Resume the patient is alert and pleasant. She is accompanied by HER-2 daughters, her son and her son-in-law. The patient denies headaches, nausea, vomiting, seizures. She does admit to being a bit unsteady with her gait.  Past Medical History  Diagnosis Date  . Diverticulitis     History reviewed. No pertinent past surgical history.  History reviewed. No pertinent family history.  Social History:  reports that she has quit smoking. She does not have any smokeless tobacco history on file. Her alcohol and drug histories are not on file.  Allergies: No Known Allergies  Medications:  I have reviewed the patient's current medications. Prior to Admission:  Prescriptions prior to admission  Medication Sig Dispense Refill  . budesonide-formoterol (SYMBICORT) 160-4.5 MCG/ACT inhaler Inhale 2 puffs into the lungs See admin instructions. Every 9 hours.      Marland Kitchen dextromethorphan-guaiFENesin (MUCINEX DM) 30-600 MG per 12 hr tablet Take 1 tablet by mouth 2 (two) times daily.      . Ferrous Sulfate (IRON) 325 (65 FE) MG TABS Take 1 tablet by mouth 3 (three) times daily.      . pantoprazole (PROTONIX) 40 MG tablet Take 40 mg by mouth 2 (two) times daily.      Marland Kitchen azithromycin (ZITHROMAX) 250 MG tablet Take 250-500 mg by mouth  daily. Take two tablets on day 1 and 1 tablet for the next 4 days.       Scheduled: . budesonide-formoterol  2 puff Inhalation BID  . dexamethasone  20 mg Intravenous Once  . [START ON 07/05/2013] dexamethasone  4 mg Intravenous 4 times per day  . enoxaparin (LOVENOX) injection  30 mg Subcutaneous Q24H  . feeding supplement (PRO-STAT SUGAR FREE 64)  30 mL Oral q morning - 10a  . lactose free nutrition  237 mL Oral TID BM  . pantoprazole  40 mg Oral BID  . protein supplement  1 scoop Oral TID WC  . sodium chloride  3 mL Intravenous Q12H   Continuous:  IDP:OEUM & mag hydroxide-simeth, HYDROcodone-acetaminophen, ondansetron (ZOFRAN) IV, ondansetron Anti-infectives   None       Results for orders placed during the hospital encounter of 07/02/13 (from the past 48 hour(s))  BASIC METABOLIC PANEL     Status: Abnormal   Collection Time    07/03/13  4:55 AM      Result Value Ref Range   Sodium 135 (*) 137 - 147 mEq/L   Potassium 4.5  3.7 - 5.3 mEq/L   Chloride 97  96 - 112 mEq/L   CO2 21  19 - 32 mEq/L   Glucose, Bld 81  70 - 99 mg/dL   BUN 17  6 - 23 mg/dL   Creatinine, Ser 0.61  0.50 - 1.10 mg/dL   Calcium 9.9  8.4 - 10.5 mg/dL   GFR calc non Af Amer 87 (*) >90 mL/min  GFR calc Af Amer >90  >90 mL/min   Comment: (NOTE)     The eGFR has been calculated using the CKD EPI equation.     This calculation has not been validated in all clinical situations.     eGFR's persistently <90 mL/min signify possible Chronic Kidney     Disease.  CBC     Status: Abnormal   Collection Time    07/03/13  4:55 AM      Result Value Ref Range   WBC 9.6  4.0 - 10.5 K/uL   RBC 3.50 (*) 3.87 - 5.11 MIL/uL   Hemoglobin 10.1 (*) 12.0 - 15.0 g/dL   HCT 30.6 (*) 36.0 - 46.0 %   MCV 87.4  78.0 - 100.0 fL   MCH 28.9  26.0 - 34.0 pg   MCHC 33.0  30.0 - 36.0 g/dL   RDW 13.8  11.5 - 15.5 %   Platelets 356  150 - 400 K/uL  PROTIME-INR     Status: None   Collection Time    07/03/13  4:55 AM      Result  Value Ref Range   Prothrombin Time 15.1  11.6 - 15.2 seconds   INR 1.22  0.00 - 1.49  CBC     Status: Abnormal   Collection Time    07/04/13  4:10 AM      Result Value Ref Range   WBC 9.4  4.0 - 10.5 K/uL   RBC 3.51 (*) 3.87 - 5.11 MIL/uL   Hemoglobin 10.3 (*) 12.0 - 15.0 g/dL   HCT 30.7 (*) 36.0 - 46.0 %   MCV 87.5  78.0 - 100.0 fL   MCH 29.3  26.0 - 34.0 pg   MCHC 33.6  30.0 - 36.0 g/dL   RDW 13.9  11.5 - 15.5 %   Platelets 328  150 - 400 K/uL  BASIC METABOLIC PANEL     Status: Abnormal   Collection Time    07/04/13  4:10 AM      Result Value Ref Range   Sodium 134 (*) 137 - 147 mEq/L   Potassium 4.1  3.7 - 5.3 mEq/L   Chloride 97  96 - 112 mEq/L   CO2 21  19 - 32 mEq/L   Glucose, Bld 79  70 - 99 mg/dL   BUN 15  6 - 23 mg/dL   Creatinine, Ser 0.54  0.50 - 1.10 mg/dL   Calcium 9.6  8.4 - 10.5 mg/dL   GFR calc non Af Amer >90  >90 mL/min   GFR calc Af Amer >90  >90 mL/min   Comment: (NOTE)     The eGFR has been calculated using the CKD EPI equation.     This calculation has not been validated in all clinical situations.     eGFR's persistently <90 mL/min signify possible Chronic Kidney     Disease.    Mr Jeri Cos Wo Contrast  07/04/2013   CLINICAL DATA:  Metastatic lung cancer. Visual changes. Nausea and vomiting.  EXAM: MRI HEAD WITHOUT AND WITH CONTRAST  TECHNIQUE: Multiplanar, multiecho pulse sequences of the brain and surrounding structures were obtained without and with intravenous contrast.  CONTRAST:  42m MULTIHANCE GADOBENATE DIMEGLUMINE 529 MG/ML IV SOLN  COMPARISON:  None.  FINDINGS: A heterogeneous enhancing mass lesion is present in the left cerebellum measuring 3.0 x 2.9 x 2.1 cm. There is extensive surrounding edema and mass effect with near complete effacement of the fourth ventricle. Obstructing hydrocephalus is  present with transependymal CSF flow. There is also down or herniation of the cerebellar tonsils, left greater than right. The left cerebellar tonsil  extends 7 mm below the foramen magnum.  Restricted diffusion is noted within the tumor. No other restricted diffusion is evident. Other scattered subcortical T2 hyperintensities are within normal limits for age. No significant extra-axial fluid collections are present.  Flow is present in the major intracranial arteries. The globes and orbits are intact.  IMPRESSION: 1. 3.0 x 2.9 x 2.1 cm heterogeneous enhancing mass lesion left cerebellum consistent with a focal metastasis. The restricted diffusion suggests this may be a small cell lung cancer. 2. There is extensive mass effect and surrounding edema with effacement of the fourth ventricle. This results in moderate hydrocephalus with transependymal CSF flow. Critical Value/emergent results were called by telephone at the time of interpretation on 07/04/2013 at 5:36 PM to Dr. Beryle Beams, who verbally acknowledged these results.   Electronically Signed   By: Lawrence Santiago M.D.   On: 07/04/2013 18:05   US Biopsy  07/03/2013   CLINICAL DATA:  Lung mass.  Multiple liver lesions.  EXAM: ULTRASOUND GUIDED CORE BIOPSY OF RIGHT LIVER LESION  MEDICATIONS: Intravenous Fentanyl and Versed were administered as conscious sedation during continuous cardiorespiratory monitoring by the radiology RN, with a total moderate sedation time of four minutes.  PROCEDURE: The procedure, risks, benefits, and alternatives were explained to the patient. Questions regarding the procedure were encouraged and answered. The patient understands and consents to the procedure.  Survey ultrasound of the liver was performed. A representative lesion in the right lobe was localized and an appropriate skin entry site was determined.  The right upper abdomen was prepped with Betadine in a sterile fashion, and a sterile drape was applied covering the operative field. A sterile gown and sterile gloves were used for the procedure. Local anesthesia was provided with 1% Lidocaine.  Under CT fluoroscopic  guidance, a 17 gauge trocar needle was advanced to the margin of the lesion. Once needle tip position was confirmed, coaxial 18-gauge core biopsy samples were obtained, submitted in formalin to surgical pathology. The guide needle was removed. Postprocedure scans show no hemorrhage or other apparent complication. The patient tolerated the procedure well.  COMPLICATIONS: None.  FINDINGS: Multiple large liver lesions consistent with metastatic disease. Percutaneous core biopsy performed.  IMPRESSION: 1. Technically successful ultrasound-guided core biopsy of liver lesion   Electronically Signed   By: Arne Cleveland M.D.   On: 07/03/2013 16:49    ROS: As above Blood pressure 108/57, pulse 112, temperature 97.9 F (36.6 C), temperature source Oral, resp. rate 24, height _0  (1.499 m), weight 44.5 kg (98 lb 1.7 oz), SpO2 93.00%. Physical Exam  General: A thin pleasant 73 year old in no apparent distress  HEENT: Normocephalic, atraumatic, pupils equal round reactive light. Extraocular muscles intact  Neck: Supple  Abdomen: Soft  Extremities: No Obvious abnormality  Neurologic exam: The patient is alert and oriented x3. Glasgow Coma Scale 15. Cranial nerves II through XII are grossly intact. Vision and hearing are grossly normal bilaterally. The patient's motor strength is 5 over 5 in her bile biceps, triceps, hand grips, quadriceps, gastrocnemius, dorsiflexors. Cerebellar exam is normal 2 right finger to nose. She has some dysmetria with left finger to nose. Sensory functions is intact to light touch sensation all tested dermatomes bilaterally.  I reviewed the patient's brain MRI performed at [facility] with contrast. It demonstrates a left cerebellar lesion most consistent with metastasis. There  is some mass effect on the left cerebellar peduncle, brainstem, midbrain and quadrigeminal plate cistern. The patient has mild/early hydrocephalus.  Assessment/Plan: Left cerebellar lesion,  hydrocephalus: I discussed the situation with the patient and her family. I have told him that this is most likely a metastatic lung cancer, although there are other possibilities. We have discussed the various treatment options. She basically has 3 options. One is to do nothing and proceed with palliative care only. The second option would be to await the pathology and if it turns out to be a small cell lung cancer then proceed with radiation therapy only. However, given the size of this lesion, the mass effect in the hydrocephalus we may not have the luxury of waiting for steroids and radiation to work. The third, and likely the best option, is a suboccipital craniectomy with resection of this lesion.  I have described the surgery to the patient, and her family. We have discussed the risks of surgery including the risks of anesthesia, hemorrhage, stroke, infection, death, medical risk, etc. I have answered all the patient, and her family's, questions. I have asked her to think things over and decide what she wants to do. If she decides to proceed with surgery then we will plan to transfer to [facility] over the weekend and proceed with surgery on Monday. In the meantime we will treat her with steroids.  Oni Dietzman D 07/04/2013, 8:41 PM

## 2013-07-04 NOTE — Progress Notes (Signed)
Radiation Oncology         [phone] ________________________________  Initial inpatient Consultation  Name: Rachael Wall MRN: [id number]  Date: 07/04/2013  DOB: [birth year]  TG:GYIRSW,NIOEVOJ ROBERT, MD  Deatra Robinson, MD   REFERRING PHYSICIAN: Deatra Robinson, MD  DIAGNOSIS: 73 year old woman with presumed metastatic primary lung cancer, pending tissue diagnosis with compression of the superior vena cava  HISTORY OF PRESENT ILLNESS::Rachael Wall is a 73 y.o. female who presented to Dr. Edrick Oh in [place] with increasing dyspnea and weight loss. Chest x-ray at [facility] on 06/24/2013 demonstrated right hilar and right paratracheal lymphadenopathy. Consequently, chest CT was performed at [facility] on 06/30/2013. This demonstrated a 4 cm pleural-based right lower lobe lung mass. There was also a somewhat branching opacity laterally in the right lower lobe measuring 2.9 cm.  Within the mediastinum, there was bulky adenopathy with a high right paratracheal nodal mass measuring 4.7 cm, a subcarinal node measuring 5.7 cm. The patient also had bilateral liver masses measuring up to 8.7 cm on the right and 2.8 cm on the left with bilateral adrenal thickening. These findings were suggestive of stage IV metastatic primary lung cancer. The proximity of the high right paratracheal lymph node to the superior vena cava raised some concern for SVC compression but the chest CT was performed without contrast. The patient was transferred to [facility] where and she is CT data in fact show compression of the superior vena cava anteriorly in addition to compression of pulmonary vasculature from the subcarinal lymph node with invasion of the right inferior pulmonary vein. The patient underwent CT-guided liver biopsy yesterday, and results are pending. She has kindly been referred for discussion of potential radiation treatment options.  PREVIOUS RADIATION THERAPY: No  PAST  MEDICAL HISTORY:  has a past medical history of Diverticulitis.    PAST SURGICAL HISTORY:History reviewed. No pertinent past surgical history.  FAMILY HISTORY: family history is not on file.  SOCIAL HISTORY:  reports that she has quit smoking. She does not have any smokeless tobacco history on file.  ALLERGIES: Review of patient's allergies indicates no known allergies.  MEDICATIONS:  No current facility-administered medications for this encounter.   No current outpatient prescriptions on file.   Facility-Administered Medications Ordered in Other Encounters  Medication Dose Route Frequency Provider Last Rate Last Dose  . alum & mag hydroxide-simeth (MAALOX/MYLANTA) 200-200-20 MG/5ML suspension 30 mL  30 mL Oral Q6H PRN Phillips Grout, MD      . budesonide-formoterol (SYMBICORT) 160-4.5 MCG/ACT inhaler 2 puff  2 puff Inhalation BID Phillips Grout, MD   2 puff at 07/04/13 0950  . enoxaparin (LOVENOX) injection 30 mg  30 mg Subcutaneous Q24H Caren Griffins, MD   30 mg at 07/04/13 1023  . feeding supplement (PRO-STAT SUGAR FREE 64) liquid 30 mL  30 mL Oral q morning - 10a Hazle Coca, RD      . HYDROcodone-acetaminophen (NORCO/VICODIN) 5-325 MG per tablet 1-2 tablet  1-2 tablet Oral Q4H PRN Phillips Grout, MD      . lactose free nutrition (BOOST PLUS) liquid 237 mL  237 mL Oral TID BM Hazle Coca, RD      . ondansetron [facility]) tablet 4 mg  4 mg Oral Q6H PRN Phillips Grout, MD       Or  . ondansetron [facility]) injection 4 mg  4 mg Intravenous Q6H PRN Phillips Grout, MD   4 mg at 07/04/13 0617  .  pantoprazole (PROTONIX) EC tablet 40 mg  40 mg Oral BID Phillips Grout, MD   40 mg at 07/04/13 1024  . protein supplement (RESOURCE BENEPROTEIN) powder packet 6 g  1 scoop Oral TID WC Costin Karlyne Greenspan, MD      . sodium chloride 0.9 % injection 3 mL  3 mL Intravenous Q12H Phillips Grout, MD        REVIEW OF SYSTEMS:  A 15 point review of systems is documented in the electronic  medical record. This was obtained by the nursing staff. However, I reviewed this with the patient to discuss relevant findings and make appropriate changes.  Pertinent items are noted in HPI.   PHYSICAL EXAM:  Filed Vitals:    07/02/13 2100  07/02/13 2107  07/02/13 2203  07/03/13 0500   BP:   154/80   139/66   Pulse:   104   90   Temp:   97.6 F (36.4 C)   98.2 F (36.8 C)   TempSrc:   Oral   Oral   Resp:   20   20   Height:  4\' 11"  (1.499 m)      Weight:  44.453 kg (98 lb)    43.521 kg (95 lb 15.1 oz)   SpO2:   97%  97%  98%   Intake/Output Summary (Last 24 hours) at 07/03/13 0732 Last data filed at 07/03/13 0442   Gross per 24 hour   Intake  631.25 ml   Output  0 ml   Net  631.25 ml    Filed Weights    07/02/13 1538  07/02/13 2100  07/03/13 0500   Weight:  42.638 kg (94 lb)  44.453 kg (98 lb)  43.521 kg (95 lb 15.1 oz)      The patient is in no acute distress today. She is alert and oriented. She appears to be somewhat fatigued. She has no signs of SVC syndrome such as facial swelling arm swelling or engorged vasculature. She is in no respiratory distress.  KPS = 70  100 - Normal; no complaints; no evidence of disease. 90   - Able to carry on normal activity; minor signs or symptoms of disease. 80   - Normal activity with effort; some signs or symptoms of disease. 87   - Cares for self; unable to carry on normal activity or to do active work. 60   - Requires occasional assistance, but is able to care for most of his personal needs. 50   - Requires considerable assistance and frequent medical care. 46   - Disabled; requires special care and assistance. 35   - Severely disabled; hospital admission is indicated although death not imminent. 17   - Very sick; hospital admission necessary; active supportive treatment necessary. 10   - Moribund; fatal processes progressing rapidly. 0     - Dead  Karnofsky DA, Abelmann Fontanet, Craver LS and Burchenal [facility] [phone]) The use of the nitrogen  mustards in the palliative treatment of carcinoma: with particular reference to bronchogenic carcinoma Cancer 1 [phone]  LABORATORY DATA:  Lab Results  Component Value Date   WBC 9.4 07/04/2013   HGB 10.3* 07/04/2013   HCT 30.7* 07/04/2013   MCV 87.5 07/04/2013   PLT 328 07/04/2013   Lab Results  Component Value Date   NA 134* 07/04/2013   K 4.1 07/04/2013   CL 97 07/04/2013   CO2 21 07/04/2013   Lab Results  Component Value Date  ALT 27 07/02/2013   AST 85* 07/02/2013   ALKPHOS 260* 07/02/2013   BILITOT 0.6 07/02/2013     RADIOGRAPHY: Ct Angio Chest Pe W/cm &/or Wo Cm  07/02/2013   CLINICAL DATA Increased shortness of breath over the last 2 days.  Lung mass.  EXAM CT ANGIOGRAPHY CHEST WITH CONTRAST  TECHNIQUE Multidetector CT imaging of the chest was performed using the standard protocol during bolus administration of intravenous contrast. Multiplanar CT image reconstructions and MIPs were obtained to evaluate the vascular anatomy.  CONTRAST 14mL OMNIPAQUE IOHEXOL 350 MG/ML SOLN  COMPARISON Cancer prior  FINDINGS There is no acute vascular abnormality. Aortic and coronary artery atherosclerosis. Coronary artery atherosclerosis is present. If office based assessment of coronary risk factors has not been performed, it is now recommended. No pulmonary embolism is identified.  There is mass effect on the pulmonary arteries associated with mediastinal adenopathy however there is no occlusion. Right hilar adenopathy occludes the low right lower lobe bronchi. There is a posterior medial basal right lower lobe mass likely representing a primary tumor measuring 5 cm x 8 cm. Mediastinal adenopathy is present with the dumbbell-shaped subcarinal node did measures 4 cm AP x 3 cm transverse. Prominent right hilar adenopathy is present. Right hilar lymph nodes are invading the right inferior pulmonary vein with areas of tumor thrombus along the superior wall of the pulmonary vein. Metastatic disease to the liver is  present with large right hepatic lobe metastases, with the largest measuring 9 cm AP x 5.5 cm transverse.  Both adrenal glands appear enlarged, suspicious for adrenal metastasis. There is no pericardial effusion. No pleural effusion. Emphysema in the lungs. Micronodularity is present in the right lower lobe, likely representing endobronchial spread of tumor. Bilateral pleural apical scarring. There is no mass or convincing evidence metastasis in the left lung. No axillary adenopathy. The IVC is displaced anteriorly by peritracheal adenopathy, without complete occlusion. The left brachiocephalic vein is also displaced anteriorly. Osteopenia. No thoracic compression fractures. Mediastinal adenopathy extends up to the esophagus and could be associated with dysphagia.  Review of the MIP images confirms the above findings.  IMPRESSION 1. No acute abnormality. 2. Right lower lobe mass is favored to represent primary bronchogenic carcinoma with subcarinal/mediastinal and right hilar adenopathy. Invasion of the right inferior pulmonary vein. Mass effect on the right-greater-than-left pulmonary artery and right lower lobe bronchi. 3. Hepatic metastases. 4. Right-sided pulmonary nodules compatible with metastatic disease. 5. Mass effect on the left brachiocephalic vein and superior vena cava without occlusion. 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 6. 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  SIGNATURE  Electronically Signed   By: Dereck Ligas M.D.   On: 07/02/2013 17:38   US Biopsy  07/03/2013   CLINICAL DATA:  Lung mass.  Multiple liver lesions.  EXAM: ULTRASOUND GUIDED CORE BIOPSY OF RIGHT LIVER LESION  MEDICATIONS: Intravenous Fentanyl and Versed were administered as conscious sedation during continuous cardiorespiratory monitoring by the radiology RN, with a total moderate sedation time of four minutes.  PROCEDURE:  The procedure, risks, benefits, and alternatives were explained to the patient. Questions regarding the procedure were encouraged and answered. The patient understands and consents to the procedure.  Survey ultrasound of the liver was performed. A representative lesion in the right lobe was localized and an appropriate skin entry site was determined.  The right upper abdomen was prepped with Betadine in a sterile fashion, and a sterile drape  was applied covering the operative field. A sterile gown and sterile gloves were used for the procedure. Local anesthesia was provided with 1% Lidocaine.  Under CT fluoroscopic guidance, a 17 gauge trocar needle was advanced to the margin of the lesion. Once needle tip position was confirmed, coaxial 18-gauge core biopsy samples were obtained, submitted in formalin to surgical pathology. The guide needle was removed. Postprocedure scans show no hemorrhage or other apparent complication. The patient tolerated the procedure well.  COMPLICATIONS: None.  FINDINGS: Multiple large liver lesions consistent with metastatic disease. Percutaneous core biopsy performed.  IMPRESSION: 1. Technically successful ultrasound-guided core biopsy of liver lesion   Electronically Signed   By: Arne Cleveland M.D.   On: 07/03/2013 16:49      IMPRESSION: This patient is a very nice 73 year-old woman percent with dyspnea and found to have bulky mediastinal lymphadenopathy with what appears to be widely metastatic stage IV lung cancer, pending tissue diagnosis. At this point, the patient is at high risk for superior vena cava syndrome which represents a radiation oncology emergency. Depending on histology, the patient may benefit from immediate radiotherapy with palliative intent. If the biopsy reveals small cell carcinoma or lymphoma, perhaps initiation of treatment with systemic chemotherapy would be most appropriate. If however, biopsy revealed non-small cell carcinoma, I would certainly recommend  immediate initiation of radiotherapy to maintain patency of the SVC and airways.  PLAN:Today, I talked to the patient and family about the findings and work-up thus far.  We discussed the natural history of disease and general treatment, highlighting the role or radiotherapy in the management.  We discussed the available radiation techniques, and focused on the details of logistics and delivery.  We reviewed the anticipated acute and late sequelae associated with radiation in this setting.  The patient was encouraged to ask questions that I answered to the best of my ability.  I filled out a patient counseling form during our discussion including treatment diagrams.  We retained a copy for our records.    Ideally, we will have the luxury to await the results of her biopsy prior to developing a treatment plan. However, if she begins to decompensate from a respiratory standpoint or SVC symptoms, we may need to consider initiating radiotherapy without biopsy confirmation of malignancy.  I spent 60 minutes minutes face to face with the patient and more than 50% of that time was spent in counseling and/or coordination of care.    ------------------------------------------------  Sheral Apley. Tammi Klippel, M.D.

## 2013-07-04 NOTE — Progress Notes (Signed)
PROGRESS NOTE  Rachael Wall SFK:[id number] DOB: 1941-03-03 DOA: 07/02/2013 PCP: [name], MD  HPI: 73 yo female overall healthy comes in with over a month of sob really worse in the last couple of days. She has lost a lot of weight in last couple of months, not eating well due to lack of appetite and when she eats it makes her sob worse. No fevers. Her pcp ordered ct scan in the last week which showed a large lung mass and was told if her sob got worse to come to ED. Denies any hemoptysis.  Assessment/Plan: Progressive dyspnea on exertion - this is likely due to metastatic disease in the setting of new lung mass. - appreciate oncology and radiation oncology consultation - MR brain per oncology pending Right lower lobe mass - with metastasis to liver, adrenals, lymph nodes. Stage IV most likely. Awaiting pathology SVC syndrome - clinically less evident but short of breath Severe malnutrition - nutrition consulted.  Weakness - PT consult.  Tobacco abuse, in remission - patient quit few months ago due to #1.  Diet: regular Fluids: None DVT Prophylaxis: Lovenox  Code Status: Full Family Communication: d/w patient and family Disposition Plan: inpatient, home when ready  Consultants:  Pulmonology  Interventional radiology  Oncology  Radiation oncology  Procedures:  None   Antibiotics - None  HPI/Subjective: She is feeling well, weak, with difficulties eating.   Objective: Filed Vitals:   07/03/13 2057 07/03/13 2324 07/04/13 0629 07/04/13 0952  BP:  142/74 174/76   Pulse:  91 69   Temp:  98.2 F (36.8 C) 97.7 F (36.5 C)   TempSrc:  Oral Oral   Resp:  16 18   Height:      Weight:   44.5 kg (98 lb 1.7 oz)   SpO2: 94% 97% 95% 93%    Intake/Output Summary (Last 24 hours) at 07/04/13 1159 Last data filed at 07/04/13 0900  Gross per 24 hour  Intake    840 ml  Output    425 ml  Net    415 ml   Filed Weights   07/02/13 2100 07/03/13 0500 07/04/13  0629  Weight: 44.453 kg (98 lb) 43.521 kg (95 lb 15.1 oz) 44.5 kg (98 lb 1.7 oz)    Exam:  General:  NAD  Cardiovascular: regular rate and rhythm, without MRG  Respiratory: good air movement, clear to auscultation throughout, no wheezing, ronchi or rales  Abdomen: soft, not tender to palpation, positive bowel sounds  MSK: no peripheral edema  Neuro: non focal  Data Reviewed: Basic Metabolic Panel:  Recent Labs Lab 07/02/13 1541 07/03/13 0455 07/04/13 0410  NA 131* 135* 134*  K 4.8 4.5 4.1  CL 90* 97 97  CO2 21 21 21   GLUCOSE 96 81 79  BUN 20 17 15   CREATININE 0.73 0.61 0.54  CALCIUM 11.4* 9.9 9.6   Liver Function Tests:  Recent Labs Lab 07/02/13 1541  AST 85*  ALT 27  ALKPHOS 260*  BILITOT 0.6  PROT 8.2  ALBUMIN 3.7    Recent Labs Lab 07/02/13 1541  LIPASE 73*   CBC:  Recent Labs Lab 07/02/13 1541 07/03/13 0455 07/04/13 0410  WBC 12.6* 9.6 9.4  NEUTROABS 9.0*  --   --   HGB 12.9 10.1* 10.3*  HCT 36.8 30.6* 30.7*  MCV 86.8 87.4 87.5  PLT 416* 356 328   Studies: Ct Angio Chest Pe W/cm &/or Wo Cm  07/02/2013   CLINICAL DATA Increased shortness of  breath over the last 2 days.  Lung mass.  EXAM CT ANGIOGRAPHY CHEST WITH CONTRAST  TECHNIQUE Multidetector CT imaging of the chest was performed using the standard protocol during bolus administration of intravenous contrast. Multiplanar CT image reconstructions and MIPs were obtained to evaluate the vascular anatomy.  CONTRAST 32mL OMNIPAQUE IOHEXOL 350 MG/ML SOLN  COMPARISON Cancer prior  FINDINGS There is no acute vascular abnormality. Aortic and coronary artery atherosclerosis. Coronary artery atherosclerosis is present. If office based assessment of coronary risk factors has not been performed, it is now recommended. No pulmonary embolism is identified.  There is mass effect on the pulmonary arteries associated with mediastinal adenopathy however there is no occlusion. Right hilar adenopathy occludes the  low right lower lobe bronchi. There is a posterior medial basal right lower lobe mass likely representing a primary tumor measuring 5 cm x 8 cm. Mediastinal adenopathy is present with the dumbbell-shaped subcarinal node did measures 4 cm AP x 3 cm transverse. Prominent right hilar adenopathy is present. Right hilar lymph nodes are invading the right inferior pulmonary vein with areas of tumor thrombus along the superior wall of the pulmonary vein. Metastatic disease to the liver is present with large right hepatic lobe metastases, with the largest measuring 9 cm AP x 5.5 cm transverse.  Both adrenal glands appear enlarged, suspicious for adrenal metastasis. There is no pericardial effusion. No pleural effusion. Emphysema in the lungs. Micronodularity is present in the right lower lobe, likely representing endobronchial spread of tumor. Bilateral pleural apical scarring. There is no mass or convincing evidence metastasis in the left lung. No axillary adenopathy. The IVC is displaced anteriorly by peritracheal adenopathy, without complete occlusion. The left brachiocephalic vein is also displaced anteriorly. Osteopenia. No thoracic compression fractures. Mediastinal adenopathy extends up to the esophagus and could be associated with dysphagia.  Review of the MIP images confirms the above findings.  IMPRESSION 1. No acute abnormality. 2. Right lower lobe mass is favored to represent primary bronchogenic carcinoma with subcarinal/mediastinal and right hilar adenopathy. Invasion of the right inferior pulmonary vein. Mass effect on the right-greater-than-left pulmonary artery and right lower lobe bronchi. 3. Hepatic metastases. 4. Right-sided pulmonary nodules compatible with metastatic disease. 5. Mass effect on the left brachiocephalic vein and superior vena cava without occlusion. 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 6. Bilateral adrenal enlargement suspicious  for bilateral adrenal metastatic disease.  SIGNATURE  Electronically Signed   By: Dereck Ligas M.D.   On: 07/02/2013 17:38   US Biopsy  07/03/2013   CLINICAL DATA:  Lung mass.  Multiple liver lesions.  EXAM: ULTRASOUND GUIDED CORE BIOPSY OF RIGHT LIVER LESION  MEDICATIONS: Intravenous Fentanyl and Versed were administered as conscious sedation during continuous cardiorespiratory monitoring by the radiology RN, with a total moderate sedation time of four minutes.  PROCEDURE: The procedure, risks, benefits, and alternatives were explained to the patient. Questions regarding the procedure were encouraged and answered. The patient understands and consents to the procedure.  Survey ultrasound of the liver was performed. A representative lesion in the right lobe was localized and an appropriate skin entry site was determined.  The right upper abdomen was prepped with Betadine in a sterile fashion, and a sterile drape was applied covering the operative field. A sterile gown and sterile gloves were used for the procedure. Local anesthesia was provided with 1% Lidocaine.  Under CT fluoroscopic guidance, a 17 gauge trocar needle was advanced to the margin of  the lesion. Once needle tip position was confirmed, coaxial 18-gauge core biopsy samples were obtained, submitted in formalin to surgical pathology. The guide needle was removed. Postprocedure scans show no hemorrhage or other apparent complication. The patient tolerated the procedure well.  COMPLICATIONS: None.  FINDINGS: Multiple large liver lesions consistent with metastatic disease. Percutaneous core biopsy performed.  IMPRESSION: 1. Technically successful ultrasound-guided core biopsy of liver lesion   Electronically Signed   By: Arne Cleveland M.D.   On: 07/03/2013 16:49    Scheduled Meds: . budesonide-formoterol  2 puff Inhalation BID  . enoxaparin (LOVENOX) injection  30 mg Subcutaneous Q24H  . feeding supplement (PRO-STAT SUGAR FREE 64)  30 mL Oral q  morning - 10a  . lactose free nutrition  237 mL Oral TID BM  . pantoprazole  40 mg Oral BID  . protein supplement  1 scoop Oral TID WC  . sodium chloride  3 mL Intravenous Q12H   Continuous Infusions:    Principal Problem:   SOB (shortness of breath) Active Problems:   Lung mass   Metastasis   Sinus tachycardia   Hyponatremia   Metastatic lung carcinoma   Unspecified protein-calorie malnutrition   Emphysema lung   Protein-calorie malnutrition, severe  Time spent: 25  This note has been created with Surveyor, quantity. Any transcriptional errors are unintentional.   Marzetta Board, MD [facility] Pager [phone]. If 7 PM - 7 AM, please contact night-coverage at [email or web address], password [facility] 07/04/2013, 11:59 AM  LOS: 2 days

## 2013-07-04 NOTE — Progress Notes (Signed)
INITIAL NUTRITION ASSESSMENT  DOCUMENTATION CODES Per approved criteria  -Severe malnutrition in the context of chronic illness  Pt meets criteria for severe MALNUTRITION in the context of chronic illness as evidenced by 24% weight loss in 3 months, PO intake <75% for > one month.   INTERVENTION: Recommend Boost TID Recommend Beneprotein TID Recommend Pro-Stat once daily Will place 10am, 2pm, 8pm snack orders Encouraged PO intake Will continue to monitor  NUTRITION DIAGNOSIS: Inadequate oral intake related to decreased appetite/coughing as evidenced by PO intake <75%, 30 lbs wt loss.   Goal: Pt to meet >/= 90% of their estimated nutrition needs    Monitor:  Total protein/energy intake, labs, weights, swallow profile  Reason for Assessment: Consult to Assess  73 y.o. female  Admitting Dx: SOB (shortness of breath)  ASSESSMENT: 73 yo female overall healthy comes in with over a month of sob really worse in the last couple of days. She has lost a lot of weight in last couple of months, not eating well due to lack of appetite and when she eats it makes her sob worse. No fevers. Her pcp ordered ct scan in the last week which showed a large lung mass and was told if her sob got worse to come to ED  -Pt reported unintentional wt loss of 30 lbs since 11-03/2013 -Has significant decreased appetite -Diet recall indicates pt consuming tea/small amount of oatmeal for breakfast, occasional lunch (baked potatoes/soups), and dinner prepared by family member (fish/turkey w/starch) -Diet has largely consisted of soft foods -Has difficulty with coughing/swallowing. Reported that foods appear to be stuck in throat after swallowing, and coughing episodes ensue. Pt has been eating meals very slowly and small bites for tolerance -Has been drinking 1-2 Boost daily. Prefers chocolate flavor. Has been getting tired of taste, encouraged pt to consume as smoothie base for variety of flavors -Pt new dx  with lung cancer stage IV per MD. Will likely undergo radiation and chemo -PO intake <25%. Was open to snacks and family wanted to trial protein supplements  -With visible loss of muscle mass and subcutaneous fat loss in clavicle and upper arm region   Height: Ht Readings from Last 1 Encounters:  07/02/13 4\' 11"  (1.499 m)    Weight: Wt Readings from Last 1 Encounters:  07/04/13 98 lb 1.7 oz (44.5 kg)    Ideal Body Weight: 97.5 lbs  % Ideal Body Weight: 101%  Wt Readings from Last 10 Encounters:  07/04/13 98 lb 1.7 oz (44.5 kg)    Usual Body Weight: 125 lbs  % Usual Body Weight: 78%  BMI:  Body mass index is 19.8 kg/(m^2).  Estimated Nutritional Needs: Kcal: 1350-1550 Protein: 65-75 gram Fluid: >/=1500 ml/daily  Skin: WDL  Diet Order: General  EDUCATION NEEDS: -Education needs addressed   Intake/Output Summary (Last 24 hours) at 07/04/13 1059 Last data filed at 07/04/13 0900  Gross per 24 hour  Intake    840 ml  Output    425 ml  Net    415 ml    Last BM: 3/12   Labs:   Recent Labs Lab 07/02/13 1541 07/03/13 0455 07/04/13 0410  NA 131* 135* 134*  K 4.8 4.5 4.1  CL 90* 97 97  CO2 21 21 21   BUN 20 17 15   CREATININE 0.73 0.61 0.54  CALCIUM 11.4* 9.9 9.6  GLUCOSE 96 81 79    CBG (last 3)  No results found for this basename: GLUCAP,  in the last 72 hours  Scheduled Meds: . budesonide-formoterol  2 puff Inhalation BID  . enoxaparin (LOVENOX) injection  30 mg Subcutaneous Q24H  . feeding supplement (PRO-STAT SUGAR FREE 64)  30 mL Oral q morning - 10a  . lactose free nutrition  237 mL Oral TID BM  . pantoprazole  40 mg Oral BID  . protein supplement  1 scoop Oral TID WC  . sodium chloride  3 mL Intravenous Q12H    Continuous Infusions:   Past Medical History  Diagnosis Date  . Diverticulitis     History reviewed. No pertinent past surgical history.  Atlee Abide MS RD LDN Clinical Dietitian [phone]

## 2013-07-04 NOTE — Progress Notes (Signed)
PT Cancellation Note  Patient Details Name: Rachael Wall MRN: [id number] DOB: 1940/08/12   Cancelled Treatment:    Reason Eval/Treat Not Completed: Patient not medically ready (sleeping and await MRI per RN. will check back tiomorrow.)   Claretha Cooper 07/04/2013, 3:06 PM

## 2013-07-04 NOTE — Consult Note (Signed)
[facility]  [phone] Fax:([phone]) [place]    Referral MD: Dr. Cruzita Lederer  Reason for Referral:  Lung Cancer  Chief Complaint  Patient presents with  . Shortness of Breath  : lung cancer  HPI: 73 year old female who is a former smoker (quit 1 month ago) admitted SOB, dysphagia and about 20 lbs weight loss. Work up revealed right lung mass and large mediastinal/subcarinal and hilar adenopathy with large liver and adrenal mets. Patient seen by pulmonary and was recommended biopsy of liver lesions by IR. This was performed by IR on 07/03/13. Patient has improved clinically since being admitted to the hospital. Her son and daughters were by the bedside.    Past Medical History  Diagnosis Date  . Diverticulitis   :  History reviewed. No pertinent past surgical history.:  Current Facility-Administered Medications  Medication Dose Route Frequency Provider Last Rate Last Dose  . alum & mag hydroxide-simeth (MAALOX/MYLANTA) 200-200-20 MG/5ML suspension 30 mL  30 mL Oral Q6H PRN Phillips Grout, MD      . budesonide-formoterol (SYMBICORT) 160-4.5 MCG/ACT inhaler 2 puff  2 puff Inhalation BID Phillips Grout, MD   2 puff at 07/03/13 2057  . HYDROcodone-acetaminophen (NORCO/VICODIN) 5-325 MG per tablet 1-2 tablet  1-2 tablet Oral Q4H PRN Phillips Grout, MD      . ondansetron [facility]) tablet 4 mg  4 mg Oral Q6H PRN Phillips Grout, MD       Or  . ondansetron [facility]) injection 4 mg  4 mg Intravenous Q6H PRN Phillips Grout, MD   4 mg at 07/04/13 0617  . pantoprazole (PROTONIX) EC tablet 40 mg  40 mg Oral BID Phillips Grout, MD   40 mg at 07/03/13 2037  . sodium chloride 0.9 % injection 3 mL  3 mL Intravenous Q12H Phillips Grout, MD         No Known Allergies:  History reviewed. No pertinent family history.:  History   Social History  . Marital Status: Legally Separated    Spouse Name: N/A    Number of Children: N/A    . Years of Education: N/A   Occupational History  . Not on file.   Social History Main Topics  . Smoking status: Former Research scientist (life sciences)  . Smokeless tobacco: Not on file  . Alcohol Use: Not on file  . Drug Use: Not on file  . Sexual Activity: Not on file   Other Topics Concern  . Not on file   Social History Narrative  . No narrative on file  :    Exam: Patient Vitals for the past 24 hrs:  BP Temp Temp src Pulse Resp SpO2  07/04/13 0629 174/76 mmHg 97.7 F (36.5 C) Oral 69 18 95 %  07/03/13 2324 142/74 mmHg 98.2 F (36.8 C) Oral 91 16 97 %  07/03/13 2057 - - - - - 94 %  07/03/13 1631 134/70 mmHg - - 106 13 95 %  07/03/13 1627 107/72 mmHg - - 102 14 97 %  07/03/13 1624 117/73 mmHg - - 105 13 95 %  07/03/13 1622 160/79 mmHg - - 99 14 96 %  07/03/13 1543 148/71 mmHg - - 92 30 97 %  07/03/13 1350 122/56 mmHg 97.9 F (36.6 C) Oral 90 20 97 %    General:  well-nourished in no acute distress.  Eyes:  no scleral icterus.  ENT:  There were no  oropharyngeal lesions.  Neck was without thyromegaly.  Lymphatics:  Negative cervical, supraclavicular or axillary adenopathy.  Respiratory: lungs were clear bilaterally without wheezing or crackles.  Cardiovascular:  Regular rate and rhythm, S1/S2, without murmur, rub or gallop.  There was no pedal edema.  GI:  abdomen was soft, flat, nontender, nondistended, without organomegaly.  Muscoloskeletal:  no spinal tenderness of palpation of vertebral spine.  Skin exam was without echymosis, petichae.  Neuro exam was nonfocal.  Patient was able to get on and off exam table without assistance.  Gait was normal.  Patient was alerted and oriented.  Attention was good.   Language was appropriate.  Mood was normal without depression.  Speech was not pressured.  Thought content was not tangential.     Lab Results  Component Value Date   WBC 9.4 07/04/2013   HGB 10.3* 07/04/2013   HCT 30.7* 07/04/2013   PLT 328 07/04/2013   GLUCOSE 79 07/04/2013   ALT 27  07/02/2013   AST 85* 07/02/2013   NA 134* 07/04/2013   K 4.1 07/04/2013   CL 97 07/04/2013   CREATININE 0.54 07/04/2013   BUN 15 07/04/2013   CO2 21 07/04/2013   INR 1.22 07/03/2013    Ct Angio Chest Pe W/cm &/or Wo Cm  07/02/2013   CLINICAL DATA Increased shortness of breath over the last 2 days.  Lung mass.  EXAM CT ANGIOGRAPHY CHEST WITH CONTRAST  TECHNIQUE Multidetector CT imaging of the chest was performed using the standard protocol during bolus administration of intravenous contrast. Multiplanar CT image reconstructions and MIPs were obtained to evaluate the vascular anatomy.  CONTRAST 43mL OMNIPAQUE IOHEXOL 350 MG/ML SOLN  COMPARISON Cancer prior  FINDINGS There is no acute vascular abnormality. Aortic and coronary artery atherosclerosis. Coronary artery atherosclerosis is present. If office based assessment of coronary risk factors has not been performed, it is now recommended. No pulmonary embolism is identified.  There is mass effect on the pulmonary arteries associated with mediastinal adenopathy however there is no occlusion. Right hilar adenopathy occludes the low right lower lobe bronchi. There is a posterior medial basal right lower lobe mass likely representing a primary tumor measuring 5 cm x 8 cm. Mediastinal adenopathy is present with the dumbbell-shaped subcarinal node did measures 4 cm AP x 3 cm transverse. Prominent right hilar adenopathy is present. Right hilar lymph nodes are invading the right inferior pulmonary vein with areas of tumor thrombus along the superior wall of the pulmonary vein. Metastatic disease to the liver is present with large right hepatic lobe metastases, with the largest measuring 9 cm AP x 5.5 cm transverse.  Both adrenal glands appear enlarged, suspicious for adrenal metastasis. There is no pericardial effusion. No pleural effusion. Emphysema in the lungs. Micronodularity is present in the right lower lobe, likely representing endobronchial spread of tumor.  Bilateral pleural apical scarring. There is no mass or convincing evidence metastasis in the left lung. No axillary adenopathy. The IVC is displaced anteriorly by peritracheal adenopathy, without complete occlusion. The left brachiocephalic vein is also displaced anteriorly. Osteopenia. No thoracic compression fractures. Mediastinal adenopathy extends up to the esophagus and could be associated with dysphagia.  Review of the MIP images confirms the above findings.  IMPRESSION 1. No acute abnormality. 2. Right lower lobe mass is favored to represent primary bronchogenic carcinoma with subcarinal/mediastinal and right hilar adenopathy. Invasion of the right inferior pulmonary vein. Mass effect on the right-greater-than-left pulmonary artery and right lower lobe bronchi. 3. Hepatic metastases. 4.  Right-sided pulmonary nodules compatible with metastatic disease. 5. Mass effect on the left brachiocephalic vein and superior vena cava without occlusion. 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 6. 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  SIGNATURE  Electronically Signed   By: Dereck Ligas M.D.   On: 07/02/2013 17:38   US Biopsy  07/03/2013   CLINICAL DATA:  Lung mass.  Multiple liver lesions.  EXAM: ULTRASOUND GUIDED CORE BIOPSY OF RIGHT LIVER LESION  MEDICATIONS: Intravenous Fentanyl and Versed were administered as conscious sedation during continuous cardiorespiratory monitoring by the radiology RN, with a total moderate sedation time of four minutes.  PROCEDURE: The procedure, risks, benefits, and alternatives were explained to the patient. Questions regarding the procedure were encouraged and answered. The patient understands and consents to the procedure.  Survey ultrasound of the liver was performed. A representative lesion in the right lobe was localized and an appropriate skin entry site was determined.  The right  upper abdomen was prepped with Betadine in a sterile fashion, and a sterile drape was applied covering the operative field. A sterile gown and sterile gloves were used for the procedure. Local anesthesia was provided with 1% Lidocaine.  Under CT fluoroscopic guidance, a 17 gauge trocar needle was advanced to the margin of the lesion. Once needle tip position was confirmed, coaxial 18-gauge core biopsy samples were obtained, submitted in formalin to surgical pathology. The guide needle was removed. Postprocedure scans show no hemorrhage or other apparent complication. The patient tolerated the procedure well.  COMPLICATIONS: None.  FINDINGS: Multiple large liver lesions consistent with metastatic disease. Percutaneous core biopsy performed.  IMPRESSION: 1. Technically successful ultrasound-guided core biopsy of liver lesion   Electronically Signed   By: Arne Cleveland M.D.   On: 07/03/2013 16:49      Ct Angio Chest Pe W/cm &/or Wo Cm  07/02/2013   CLINICAL DATA Increased shortness of breath over the last 2 days.  Lung mass.  EXAM CT ANGIOGRAPHY CHEST WITH CONTRAST  TECHNIQUE Multidetector CT imaging of the chest was performed using the standard protocol during bolus administration of intravenous contrast. Multiplanar CT image reconstructions and MIPs were obtained to evaluate the vascular anatomy.  CONTRAST 29mL OMNIPAQUE IOHEXOL 350 MG/ML SOLN  COMPARISON Cancer prior  FINDINGS There is no acute vascular abnormality. Aortic and coronary artery atherosclerosis. Coronary artery atherosclerosis is present. If office based assessment of coronary risk factors has not been performed, it is now recommended. No pulmonary embolism is identified.  There is mass effect on the pulmonary arteries associated with mediastinal adenopathy however there is no occlusion. Right hilar adenopathy occludes the low right lower lobe bronchi. There is a posterior medial basal right lower lobe mass likely representing a primary tumor  measuring 5 cm x 8 cm. Mediastinal adenopathy is present with the dumbbell-shaped subcarinal node did measures 4 cm AP x 3 cm transverse. Prominent right hilar adenopathy is present. Right hilar lymph nodes are invading the right inferior pulmonary vein with areas of tumor thrombus along the superior wall of the pulmonary vein. Metastatic disease to the liver is present with large right hepatic lobe metastases, with the largest measuring 9 cm AP x 5.5 cm transverse.  Both adrenal glands appear enlarged, suspicious for adrenal metastasis. There is no pericardial effusion. No pleural effusion. Emphysema in the lungs. Micronodularity is present in the right lower lobe, likely representing endobronchial spread of tumor. Bilateral pleural apical scarring. There is  no mass or convincing evidence metastasis in the left lung. No axillary adenopathy. The IVC is displaced anteriorly by peritracheal adenopathy, without complete occlusion. The left brachiocephalic vein is also displaced anteriorly. Osteopenia. No thoracic compression fractures. Mediastinal adenopathy extends up to the esophagus and could be associated with dysphagia.  Review of the MIP images confirms the above findings.  IMPRESSION 1. No acute abnormality. 2. Right lower lobe mass is favored to represent primary bronchogenic carcinoma with subcarinal/mediastinal and right hilar adenopathy. Invasion of the right inferior pulmonary vein. Mass effect on the right-greater-than-left pulmonary artery and right lower lobe bronchi. 3. Hepatic metastases. 4. Right-sided pulmonary nodules compatible with metastatic disease. 5. Mass effect on the left brachiocephalic vein and superior vena cava without occlusion. 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 6. 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  SIGNATURE  Electronically Signed   By: Dereck Ligas M.D.   On: 07/02/2013  17:38   US Biopsy  07/03/2013   CLINICAL DATA:  Lung mass.  Multiple liver lesions.  EXAM: ULTRASOUND GUIDED CORE BIOPSY OF RIGHT LIVER LESION  MEDICATIONS: Intravenous Fentanyl and Versed were administered as conscious sedation during continuous cardiorespiratory monitoring by the radiology RN, with a total moderate sedation time of four minutes.  PROCEDURE: The procedure, risks, benefits, and alternatives were explained to the patient. Questions regarding the procedure were encouraged and answered. The patient understands and consents to the procedure.  Survey ultrasound of the liver was performed. A representative lesion in the right lobe was localized and an appropriate skin entry site was determined.  The right upper abdomen was prepped with Betadine in a sterile fashion, and a sterile drape was applied covering the operative field. A sterile gown and sterile gloves were used for the procedure. Local anesthesia was provided with 1% Lidocaine.  Under CT fluoroscopic guidance, a 17 gauge trocar needle was advanced to the margin of the lesion. Once needle tip position was confirmed, coaxial 18-gauge core biopsy samples were obtained, submitted in formalin to surgical pathology. The guide needle was removed. Postprocedure scans show no hemorrhage or other apparent complication. The patient tolerated the procedure well.  COMPLICATIONS: None.  FINDINGS: Multiple large liver lesions consistent with metastatic disease. Percutaneous core biopsy performed.  IMPRESSION: 1. Technically successful ultrasound-guided core biopsy of liver lesion   Electronically Signed   By: Arne Cleveland M.D.   On: 07/03/2013 16:49    Assessment and Plan:  73 y/o female with:  1. New diagnosis of probable lung cancer, pathology results pending. She most likely is stage IV. I discussed the scns with patient and family. She is symptomatic from the adenopathy and I do think she will benefit from radiation therapy. I have contacted Dr.  Sondra Come who has agreed to see the patient while she is admitted.  2. She will need systemic chemotherapy. The course of treatment will be determined by the results of the final pathology. We discussed the different types of lung cancer, small cell vs NSCLC. We discussed stages of cancer. They understand that patient most likely has stage IV disease. Particulars of chemotherapy will be discussed once the pathology results are out.  3. I also have discussed that Dr. Julien Nordmann is our lung cancer specialist and that I will ask him to take over her care in the future.  The length of time of the face-to-face encounter was 50    minutes. More than 50% of time was spent  counseling and coordination of care.  Marcy Panning, MD Medical/Oncology [facility] [phone] (beeper) [phone] (Office)

## 2013-07-05 MED ORDER — HYDROCODONE-ACETAMINOPHEN 5-325 MG PO TABS: 1.0000 | ORAL_TABLET | ORAL | Status: DC | PRN

## 2013-07-05 NOTE — Addendum Note (Signed)
Encounter addended by: Lora Paula, MD on: 07/05/2013 11:16 AM<BR>     Documentation filed: Notes Section

## 2013-07-05 NOTE — Progress Notes (Signed)
Assumed care of patient at 11 pm.  I agree with previous assessment.  No events overnight.  Will continue to monitor patient.  [place] Shark, Catlyn Shipton Cherie

## 2013-07-05 NOTE — Progress Notes (Signed)
Pt transferred to [facility]. Left unit on stretcher pushed by [facility] personnel accompanied by dtr. Left in good condition. Pt left for [facility]. Family made aware at bedside.iams,rn.

## 2013-07-05 NOTE — Progress Notes (Signed)
PROGRESS NOTE  Rachael Wall WYO:[id number] DOB: [birth year] DOA: 07/02/2013 PCP: [name], MD  HPI: 73 yo female overall healthy comes in with over a month of sob really worse in the last couple of days. She has lost a lot of weight in last couple of months, not eating well due to lack of appetite and when she eats it makes her sob worse. No fevers. Her pcp ordered ct scan in the last week which showed a large lung mass and was told if her sob got worse to come to ED. Denies any hemoptysis.  Assessment/Plan: Progressive dyspnea on exertion - this is likely due to metastatic disease in the setting of new lung mass, biopsy with metastatic carcinoma. 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 Neurosurgery evaluated patient 3/13 and recommended surgery; patient initially not sure whether she wants surgery but she wants to proceed now. Will transfer patient to [facility] hospital for likely surgery on Monday.  Right lower lobe mass - with metastasis to liver, adrenals, lymph nodes, brain. Stage IV SVC syndrome - clinically less evident but short of breath Severe malnutrition - nutrition consulted.  Weakness - PT consult.  Tobacco abuse, in remission - patient quit few months ago due to #1.  Diet: regular Fluids: None DVT Prophylaxis: SCDs  Code Status: Full Family Communication: d/w patient and family Disposition Plan: inpatient, [facility] transfer  Consultants:  Pulmonology  Interventional radiology  Oncology  Radiation oncology  Neurosurgery  Procedures:  None   Antibiotics - None  HPI/Subjective: She is feeling well, weak, with difficulties eating.   Objective: Filed Vitals:   07/04/13 1325 07/04/13 2012 07/04/13 2109 07/05/13 0536  BP: 108/57  160/98 149/73  Pulse: 112  97 94  Temp: 97.9 F (36.6 C)  97.9 F (36.6 C) 98.2 F (36.8 C)  TempSrc: Oral  Oral Oral  Resp: 24  22 18   Height:      Weight:      SpO2: 95% 93% 98%  95%    Intake/Output Summary (Last 24 hours) at 07/05/13 0827 Last data filed at 07/05/13 7741  Gross per 24 hour  Intake    600 ml  Output    550 ml  Net     50 ml   Filed Weights   07/02/13 2100 07/03/13 0500 07/04/13 0629  Weight: 44.453 kg (98 lb) 43.521 kg (95 lb 15.1 oz) 44.5 kg (98 lb 1.7 oz)    Exam:  General:  NAD  Cardiovascular: regular rate and rhythm, without MRG  Respiratory: good air movement, clear to auscultation throughout, no wheezing, ronchi or rales  Abdomen: soft, not tender to palpation, positive bowel sounds  MSK: no peripheral edema  Neuro: non focal  Data Reviewed: Basic Metabolic Panel:  Recent Labs Lab 07/02/13 1541 07/03/13 0455 07/04/13 0410  NA 131* 135* 134*  K 4.8 4.5 4.1  CL 90* 97 97  CO2 21 21 21   GLUCOSE 96 81 79  BUN 20 17 15   CREATININE 0.73 0.61 0.54  CALCIUM 11.4* 9.9 9.6   Liver Function Tests:  Recent Labs Lab 07/02/13 1541  AST 85*  ALT 27  ALKPHOS 260*  BILITOT 0.6  PROT 8.2  ALBUMIN 3.7    Recent Labs Lab 07/02/13 1541  LIPASE 73*   CBC:  Recent Labs Lab 07/02/13 1541 07/03/13 0455 07/04/13 0410  WBC 12.6* 9.6 9.4  NEUTROABS 9.0*  --   --   HGB 12.9 10.1* 10.3*  HCT  36.8 30.6* 30.7*  MCV 86.8 87.4 87.5  PLT 416* 356 328   Studies: Mr Kizzie Fantasia Contrast  Jul 12, 2013   CLINICAL DATA:  Metastatic lung cancer. Visual changes. Nausea and vomiting.  EXAM: MRI HEAD WITHOUT AND WITH CONTRAST  TECHNIQUE: Multiplanar, multiecho pulse sequences of the brain and surrounding structures were obtained without and with intravenous contrast.  CONTRAST:  81mL MULTIHANCE GADOBENATE DIMEGLUMINE 529 MG/ML IV SOLN  COMPARISON:  None.  FINDINGS: A heterogeneous enhancing mass lesion is present in the left cerebellum measuring 3.0 x 2.9 x 2.1 cm. There is extensive surrounding edema and mass effect with near complete effacement of the fourth ventricle. Obstructing hydrocephalus is present with transependymal CSF  flow. There is also down or herniation of the cerebellar tonsils, left greater than right. The left cerebellar tonsil extends 7 mm below the foramen magnum.  Restricted diffusion is noted within the tumor. No other restricted diffusion is evident. Other scattered subcortical T2 hyperintensities are within normal limits for age. No significant extra-axial fluid collections are present.  Flow is present in the major intracranial arteries. The globes and orbits are intact.  IMPRESSION: 1. 3.0 x 2.9 x 2.1 cm heterogeneous enhancing mass lesion left cerebellum consistent with a focal metastasis. The restricted diffusion suggests this may be a small cell lung cancer. 2. There is extensive mass effect and surrounding edema with effacement of the fourth ventricle. This results in moderate hydrocephalus with transependymal CSF flow. Critical Value/emergent results were called by telephone at the time of interpretation on Jul 12, 2013 at 5:36 PM to Dr. Beryle Beams, who verbally acknowledged these results.   Electronically Signed   By: Lawrence Santiago M.D.   On: 2013-07-12 18:05   US Biopsy  07/03/2013   CLINICAL DATA:  Lung mass.  Multiple liver lesions.  EXAM: ULTRASOUND GUIDED CORE BIOPSY OF RIGHT LIVER LESION  MEDICATIONS: Intravenous Fentanyl and Versed were administered as conscious sedation during continuous cardiorespiratory monitoring by the radiology RN, with a total moderate sedation time of four minutes.  PROCEDURE: The procedure, risks, benefits, and alternatives were explained to the patient. Questions regarding the procedure were encouraged and answered. The patient understands and consents to the procedure.  Survey ultrasound of the liver was performed. A representative lesion in the right lobe was localized and an appropriate skin entry site was determined.  The right upper abdomen was prepped with Betadine in a sterile fashion, and a sterile drape was applied covering the operative field. A sterile gown and  sterile gloves were used for the procedure. Local anesthesia was provided with 1% Lidocaine.  Under CT fluoroscopic guidance, a 17 gauge trocar needle was advanced to the margin of the lesion. Once needle tip position was confirmed, coaxial 18-gauge core biopsy samples were obtained, submitted in formalin to surgical pathology. The guide needle was removed. Postprocedure scans show no hemorrhage or other apparent complication. The patient tolerated the procedure well.  COMPLICATIONS: None.  FINDINGS: Multiple large liver lesions consistent with metastatic disease. Percutaneous core biopsy performed.  IMPRESSION: 1. Technically successful ultrasound-guided core biopsy of liver lesion   Electronically Signed   By: Arne Cleveland M.D.   On: 07/03/2013 16:49    Scheduled Meds: . budesonide-formoterol  2 puff Inhalation BID  . dexamethasone  4 mg Intravenous 4 times per day  . enoxaparin (LOVENOX) injection  30 mg Subcutaneous Q24H  . feeding supplement (PRO-STAT SUGAR FREE 64)  30 mL Oral q morning - 10a  . lactose free nutrition  237  mL Oral TID BM  . pantoprazole  40 mg Oral BID  . protein supplement  1 scoop Oral TID WC  . sodium chloride  3 mL Intravenous Q12H   Continuous Infusions:   Principal Problem:   SOB (shortness of breath) Active Problems:   Lung mass   Metastasis   Sinus tachycardia   Hyponatremia   Metastatic lung carcinoma   Unspecified protein-calorie malnutrition   Emphysema lung   Protein-calorie malnutrition, severe  Time spent: 35  This note has been created with Surveyor, quantity. Any transcriptional errors are unintentional.   Marzetta Board, MD [facility] Pager [phone]. If 7 PM - 7 AM, please contact night-coverage at [email or web address], password [facility] 07/05/2013, 8:27 AM  LOS: 3 days

## 2013-07-05 NOTE — Progress Notes (Signed)
Report called and given to Polk, rn at [facility]. Nurse to call this writer and let me when the bed has been cleaned and ready so that [facility] can be notified for pt pick-up. Pt is transferring to [facility] [place]. Vwilliams,rn.

## 2013-07-05 NOTE — Progress Notes (Signed)
[facility] notified of patient's transfer. Rachael Wall will be an hour to an hour and a half before patient can be picked up. Vwilliams,rn.

## 2013-07-05 NOTE — Progress Notes (Signed)
  Radiation Oncology         [phone] ________________________________  Name: Rachael Wall MRN: [id number]  Date: 07/04/2013  DOB: 06-29-1940  Chart Note:  I reviewed this patient's most recent findings and wanted to take a minute to document my impression.  MRI shows large cerebellar metastasis.  Optimal management of her brain metastasis and SVC compression depend on tumor histology (small cell vs. Non-small cell).  Agree with decadron and await pathology.    ________________________________  Sheral Apley Tammi Klippel, M.D.

## 2013-07-05 NOTE — Progress Notes (Signed)
PT Cancellation Note  Patient Details Name: Rachael Wall MRN: [id number] DOB: 25-Sep-1940   Cancelled Treatment:    Reason Eval/Treat Not Completed: Other (comment) (to transfer to [facility] for neurosurgery)   [facility] 07/05/2013, 12:02 PM

## 2013-07-06 DIAGNOSIS — E43 Unspecified severe protein-calorie malnutrition: Secondary | ICD-10-CM

## 2013-07-06 LAB — CBC
HEMATOCRIT: 31.5 % — AB (ref 36.0–46.0)
Hemoglobin: 10.8 g/dL — ABNORMAL LOW (ref 12.0–15.0)
MCH: 29.8 pg (ref 26.0–34.0)
MCHC: 34.3 g/dL (ref 30.0–36.0)
MCV: 87 fL (ref 78.0–100.0)
Platelets: 362 10*3/uL (ref 150–400)
RBC: 3.62 MIL/uL — ABNORMAL LOW (ref 3.87–5.11)
RDW: 14 % (ref 11.5–15.5)
WBC: 12.9 10*3/uL — ABNORMAL HIGH (ref 4.0–10.5)

## 2013-07-06 LAB — BASIC METABOLIC PANEL
BUN: 16 mg/dL (ref 6–23)
CALCIUM: 9.7 mg/dL (ref 8.4–10.5)
CHLORIDE: 93 meq/L — AB (ref 96–112)
CO2: 22 meq/L (ref 19–32)
CREATININE: 0.53 mg/dL (ref 0.50–1.10)
GFR calc Af Amer: >90 mL/min (ref >90)
GFR calc non Af Amer: >90 mL/min (ref >90)
GLUCOSE: 118 mg/dL — AB (ref 70–99)
Potassium: 3.8 mEq/L (ref 3.7–5.3)
Sodium: 131 mEq/L — ABNORMAL LOW (ref 137–147)

## 2013-07-06 MED ORDER — GUAIFENESIN ER 600 MG PO TB12
1200.0000 mg | ORAL_TABLET | Freq: Two times a day (BID) | ORAL | Status: DC
  Administered 2013-07-06: 1200 mg via ORAL
  Filled 2013-07-06 (×3): qty 2

## 2013-07-06 MED ORDER — BENEPROTEIN PO POWD
1.0000 | Freq: Three times a day (TID) | ORAL | Status: DC
  Administered 2013-07-06 – 2013-07-09 (×6): 6 g via ORAL
  Filled 2013-07-06 (×2): qty 227

## 2013-07-06 MED ORDER — ACETYLCYSTEINE 20 % IN SOLN
3.0000 mL | Freq: Three times a day (TID) | RESPIRATORY_TRACT | Status: DC
  Filled 2013-07-06 (×8): qty 4

## 2013-07-06 NOTE — Progress Notes (Signed)
  Radiation Oncology         [phone] ________________________________  Name: Rachael Wall MRN: [id number]  Date: 07/02/2013  DOB: 07/13/1940  Chart Note:  I reviewed this patient's most recent notes.  She has elected to proceed with suboccipital craniectomy and resection of her cerebellar metastasis.  Hopefully, her airway and SVC compression issues will not complicate her surgery or extubation.  If she requires extended ventilator support, this may be a case where palliative radiotherapy to the chest would need to be considered to facilitate extubation.    ________________________________  Sheral Apley Tammi Klippel, M.D.

## 2013-07-06 NOTE — Progress Notes (Signed)
[facility] PROGRESS NOTE  Caraline Deutschman ZTI:[id number] DOB: 02-24-1941 DOA: 07/02/2013 PCP: [name], MD  Assessment/Plan: Progressive dyspnea on exertion - this is likely due to metastatic disease in the setting of new lung mass, biopsy with metastatic carcinoma. Per oncology. 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 Neurosurgery evaluated patient 3/13 and recommended surgery; patient initially not sure whether she wants surgery but she wants to proceed now. Patient for surgery tomorrow per neurosurgery.   Right lower lobe mass - with metastasis to liver, adrenals, lymph nodes, brain. Stage IV. Per oncology.  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  Weakness - PT consult.  Tobacco abuse, in remission - patient quit few months ago due to #1.      Code Status: Full Family Communication: Updated patient and daughters at bedside. Disposition Plan: Pending surgery.   Consultants:  Neurosurgery: Dr. Arnoldo Morale 07/04/2013  Oncology: Dr. Humphrey Rolls 07/04/2013  Interventional radiology 07/03/2013  Critical care medicine Dr. Chase Caller 07/02/2013  Procedures:  MRI brain 07/04/2013  Ultrasound guided core biopsy of the liver 07/03/2013  CT angiogram chest 07/02/2013    Antibiotics:  None  HPI/Subjective: Patient with no complaints. Patient denies any chest pain. Patient denies any shortness of breath.  Objective: Filed Vitals:   07/06/13 1647  BP: 147/82  Pulse: 90  Temp: 98 F (36.7 C)  Resp: 18    Intake/Output Summary (Last 24 hours) at 07/06/13 1800 Last data filed at 07/06/13 0130  Gross per 24 hour  Intake      3 ml  Output    100 ml  Net    -97 ml   Filed Weights   07/04/13 0629 07/05/13 1900 07/06/13 0604  Weight: 44.5 kg (98 lb 1.7 oz) 44.5 kg (98 lb 1.7 oz) 45.541 kg (100 lb 6.4 oz)    Exam:   General:  NAD  Cardiovascular:  RRR  Respiratory: CTAB  Abdomen: Soft, nontender, nondistended, positive bowel sounds.  Musculoskeletal: No clubbing cyanosis or edema.  Data Reviewed: Basic Metabolic Panel:  Recent Labs Lab 07/02/13 1541 07/03/13 0455 07/04/13 0410 07/06/13 1000  NA 131* 135* 134* 131*  K 4.8 4.5 4.1 3.8  CL 90* 97 97 93*  CO2 21 21 21 22   GLUCOSE 96 81 79 118*  BUN 20 17 15 16   CREATININE 0.73 0.61 0.54 0.53  CALCIUM 11.4* 9.9 9.6 9.7   Liver Function Tests:  Recent Labs Lab 07/02/13 1541  AST 85*  ALT 27  ALKPHOS 260*  BILITOT 0.6  PROT 8.2  ALBUMIN 3.7    Recent Labs Lab 07/02/13 1541  LIPASE 73*   No results found for this basename: AMMONIA,  in the last 168 hours CBC:  Recent Labs Lab 07/02/13 1541 07/03/13 0455 07/04/13 0410 07/06/13 1000  WBC 12.6* 9.6 9.4 12.9*  NEUTROABS 9.0*  --   --   --   HGB 12.9 10.1* 10.3* 10.8*  HCT 36.8 30.6* 30.7* 31.5*  MCV 86.8 87.4 87.5 87.0  PLT 416* 356 328 362   Cardiac Enzymes: No results found for this basename: CKTOTAL, CKMB, CKMBINDEX, TROPONINI,  in the last 168 hours BNP (last 3 results) No results found for this basename: PROBNP,  in the last 8760 hours CBG: No results found for this basename: GLUCAP,  in the last 168 hours  No results found for this or any previous visit (from the past 240 hour(s)).   Studies: No  results found.  Scheduled Meds: . acetylcysteine  3 mL Nebulization TID  . budesonide-formoterol  2 puff Inhalation BID  . dexamethasone  4 mg Intravenous 4 times per day  . feeding supplement (PRO-STAT SUGAR FREE 64)  30 mL Oral q morning - 10a  . guaiFENesin  1,200 mg Oral BID  . lactose free nutrition  237 mL Oral TID BM  . pantoprazole  40 mg Oral BID  . protein supplement  1 scoop Oral TID WC  . sodium chloride  3 mL Intravenous Q12H   Continuous Infusions:   Principal Problem:   SOB (shortness of breath) Active Problems:   Lung mass   Metastasis   Sinus tachycardia    Hyponatremia   Metastatic lung carcinoma   Unspecified protein-calorie malnutrition   Emphysema lung   Protein-calorie malnutrition, severe    Time spent: 57 mins    [facility] MD [facility] Pager [phone]. If 7PM-7AM, please contact night-coverage at [email or web address], password [facility] 07/06/2013, 6:00 PM  LOS: 4 days

## 2013-07-06 NOTE — Progress Notes (Signed)
PT Cancellation Note  Patient Details Name: Rachael Wall MRN: [id number] DOB: 1941/03/24   Cancelled Treatment:    Reason Eval/Treat Not Completed: Patient not medically ready.  Note plan for surgery tentatively tomorrow afternoon.  Will need new PT order to evaluate following surgery, will sign off case for now and await postop order.  Thank you.     Kearney Hard [facility] 07/06/2013, 12:22 PM

## 2013-07-06 NOTE — Progress Notes (Signed)
Patient ID: Rachael Wall, female   DOB: 01-Oct-1940, 73 y.o.   MRN: [id number] Subjective:  The patient is alert and pleasant. She is in no apparent distress.  Objective: Vital signs in last 24 hours: Temp:  [97.8 F (36.6 C)-98.6 F (37 C)] 97.8 F (36.6 C) (03/15 0859) Pulse Rate:  [82-95] 84 (03/15 0859) Resp:  [18] 18 (03/15 0859) BP: (136-159)/(67-89) 144/76 mmHg (03/15 0859) SpO2:  [95 %-98 %] 95 % (03/15 0859) Weight:  [44.5 kg (98 lb 1.7 oz)-45.541 kg (100 lb 6.4 oz)] 45.541 kg (100 lb 6.4 oz) (03/15 0604)  Intake/Output from previous day: 03/14 0701 - 03/15 0700 In: 3 [I.V.:3] Out: 100 [Urine:100] Intake/Output this shift:    Physical exam patient is alert and oriented. She is moving all 4 extremities well.  Lab Results:  Recent Labs  07/04/13 0410  WBC 9.4  HGB 10.3*  HCT 30.7*  PLT 328   BMET  Recent Labs  07/04/13 0410  NA 134*  K 4.1  CL 97  CO2 21  GLUCOSE 79  BUN 15  CREATININE 0.54  CALCIUM 9.6    Studies/Results: Mr Kizzie Fantasia Contrast  07/04/2013   CLINICAL DATA:  Metastatic lung cancer. Visual changes. Nausea and vomiting.  EXAM: MRI HEAD WITHOUT AND WITH CONTRAST  TECHNIQUE: Multiplanar, multiecho pulse sequences of the brain and surrounding structures were obtained without and with intravenous contrast.  CONTRAST:  15mL MULTIHANCE GADOBENATE DIMEGLUMINE 529 MG/ML IV SOLN  COMPARISON:  None.  FINDINGS: A heterogeneous enhancing mass lesion is present in the left cerebellum measuring 3.0 x 2.9 x 2.1 cm. There is extensive surrounding edema and mass effect with near complete effacement of the fourth ventricle. Obstructing hydrocephalus is present with transependymal CSF flow. There is also down or herniation of the cerebellar tonsils, left greater than right. The left cerebellar tonsil extends 7 mm below the foramen magnum.  Restricted diffusion is noted within the tumor. No other restricted diffusion is evident. Other scattered subcortical T2  hyperintensities are within normal limits for age. No significant extra-axial fluid collections are present.  Flow is present in the major intracranial arteries. The globes and orbits are intact.  IMPRESSION: 1. 3.0 x 2.9 x 2.1 cm heterogeneous enhancing mass lesion left cerebellum consistent with a focal metastasis. The restricted diffusion suggests this may be a small cell lung cancer. 2. There is extensive mass effect and surrounding edema with effacement of the fourth ventricle. This results in moderate hydrocephalus with transependymal CSF flow. Critical Value/emergent results were called by telephone at the time of interpretation on 07/04/2013 at 5:36 PM to Dr. Beryle Beams, who verbally acknowledged these results.   Electronically Signed   By: Lawrence Santiago M.D.   On: 07/04/2013 18:05    Assessment/Plan: Left cerebellar tumor, hydrocephalus: I have again discussed situation with the patient and her daughter. We have discussed the various treatment options including doing nothing, awaiting pathology and treatment with radiation therapy, and surgery. I've answered all her questions regarding surgery. She has decided proceed with surgery. We will tentatively plan to do her surgery tomorrow afternoon.  LOS: 4 days     Kavari Parrillo D 07/06/2013, 10:19 AM

## 2013-07-07 ENCOUNTER — Inpatient Hospital Stay (HOSPITAL_COMMUNITY): Payer: Medicare HMO | Admitting: Certified Registered Nurse Anesthetist

## 2013-07-07 ENCOUNTER — Encounter (HOSPITAL_COMMUNITY)
Admission: EM | Disposition: A | Discharge status: Home Health | Payer: Self-pay | Source: Home / Self Care | Attending: Neurosurgery

## 2013-07-07 ENCOUNTER — Inpatient Hospital Stay (HOSPITAL_COMMUNITY): Payer: Medicare HMO

## 2013-07-07 ENCOUNTER — Encounter (HOSPITAL_COMMUNITY): Payer: Medicare HMO | Admitting: Certified Registered Nurse Anesthetist

## 2013-07-07 ENCOUNTER — Encounter (HOSPITAL_COMMUNITY): Payer: Self-pay | Admitting: Certified Registered Nurse Anesthetist

## 2013-07-07 ENCOUNTER — Other Ambulatory Visit: Payer: Self-pay | Admitting: Neurosurgery

## 2013-07-07 DIAGNOSIS — E871 Hypo-osmolality and hyponatremia: Secondary | ICD-10-CM

## 2013-07-07 DIAGNOSIS — J438 Other emphysema: Secondary | ICD-10-CM

## 2013-07-07 DIAGNOSIS — C7949 Secondary malignant neoplasm of other parts of nervous system: Secondary | ICD-10-CM

## 2013-07-07 DIAGNOSIS — C7931 Secondary malignant neoplasm of brain: Secondary | ICD-10-CM

## 2013-07-07 HISTORY — PX: SUBOCCIPITAL CRANIECTOMY CERVICAL LAMINECTOMY: SHX5404

## 2013-07-07 LAB — BLOOD GAS, ARTERIAL
Acid-base deficit: 2.5 mmol/L — ABNORMAL HIGH (ref 0.0–2.0)
BICARBONATE: 22.6 meq/L (ref 20.0–24.0)
Drawn by: 39866
FIO2: 0.4 %
MECHVT: 350 mL
O2 SAT: 99.1 %
PEEP/CPAP: 5 cmH2O
PH ART: 7.329 — AB (ref 7.350–7.450)
PO2 ART: 142 mmHg — AB (ref 80.0–100.0)
Patient temperature: 98.1
RATE: 15 resp/min
TCO2: 23.9 mmol/L (ref 0–100)
pCO2 arterial: 44 mmHg (ref 35.0–45.0)

## 2013-07-07 LAB — PROTIME-INR
INR: 1.09 (ref 0.00–1.49)
PROTHROMBIN TIME: 13.9 s (ref 11.6–15.2)

## 2013-07-07 LAB — CBC
HCT: 30.1 % — ABNORMAL LOW (ref 36.0–46.0)
HEMOGLOBIN: 10.3 g/dL — AB (ref 12.0–15.0)
MCH: 29.4 pg (ref 26.0–34.0)
MCHC: 34.2 g/dL (ref 30.0–36.0)
MCV: 86 fL (ref 78.0–100.0)
Platelets: 356 10*3/uL (ref 150–400)
RBC: 3.5 MIL/uL — AB (ref 3.87–5.11)
RDW: 14.1 % (ref 11.5–15.5)
WBC: 12.2 10*3/uL — ABNORMAL HIGH (ref 4.0–10.5)

## 2013-07-07 LAB — BASIC METABOLIC PANEL
BUN: 18 mg/dL (ref 6–23)
CHLORIDE: 94 meq/L — AB (ref 96–112)
CO2: 24 meq/L (ref 19–32)
Calcium: 9.4 mg/dL (ref 8.4–10.5)
Creatinine, Ser: 0.53 mg/dL (ref 0.50–1.10)
GFR calc Af Amer: >90 mL/min (ref >90)
GLUCOSE: 116 mg/dL — AB (ref 70–99)
Potassium: 4.1 mEq/L (ref 3.7–5.3)
SODIUM: 132 meq/L — AB (ref 137–147)

## 2013-07-07 SURGERY — SUBOCCIPITAL CRANIECTOMY CERVICAL LAMINECTOMY/DURAPLASTY
Anesthesia: General | Site: Head

## 2013-07-07 MED ORDER — FENTANYL CITRATE 0.05 MG/ML IJ SOLN
50.0000 ug | INTRAMUSCULAR | Status: DC | PRN
  Filled 2013-07-07: qty 2

## 2013-07-07 MED ORDER — DOCUSATE SODIUM 100 MG PO CAPS
100.0000 mg | ORAL_CAPSULE | Freq: Two times a day (BID) | ORAL | Status: DC
  Filled 2013-07-07: qty 1

## 2013-07-07 MED ORDER — GLYCOPYRROLATE 0.2 MG/ML IJ SOLN
INTRAMUSCULAR | Status: DC | PRN
  Administered 2013-07-07: .8 mg via INTRAVENOUS

## 2013-07-07 MED ORDER — HYDRALAZINE HCL 20 MG/ML IJ SOLN
10.0000 mg | INTRAMUSCULAR | Status: DC | PRN
  Filled 2013-07-07: qty 1

## 2013-07-07 MED ORDER — ROCURONIUM BROMIDE 50 MG/5ML IV SOLN
INTRAVENOUS | Status: AC
  Filled 2013-07-07: qty 1

## 2013-07-07 MED ORDER — SODIUM CHLORIDE 0.9 % IV SOLN
INTRAVENOUS | Status: DC | PRN
  Administered 2013-07-07 (×2): via INTRAVENOUS

## 2013-07-07 MED ORDER — THROMBIN 20000 UNITS EX SOLR
CUTANEOUS | Status: DC | PRN
  Administered 2013-07-07: 19:00:00 via TOPICAL

## 2013-07-07 MED ORDER — BIOTENE DRY MOUTH MT LIQD
15.0000 mL | Freq: Four times a day (QID) | OROMUCOSAL | Status: DC
  Administered 2013-07-08 (×2): 15 mL via OROMUCOSAL

## 2013-07-07 MED ORDER — ONDANSETRON HCL 4 MG/2ML IJ SOLN
4.0000 mg | INTRAMUSCULAR | Status: DC | PRN
Start: 2013-07-07 — End: 2013-07-07

## 2013-07-07 MED ORDER — LABETALOL HCL 5 MG/ML IV SOLN
10.0000 mg | INTRAVENOUS | Status: DC | PRN
  Filled 2013-07-07: qty 8

## 2013-07-07 MED ORDER — SODIUM CHLORIDE 0.9 % IR SOLN
Status: DC | PRN
  Administered 2013-07-07: 19:00:00

## 2013-07-07 MED ORDER — DEXAMETHASONE SODIUM PHOSPHATE 4 MG/ML IJ SOLN
4.0000 mg | Freq: Four times a day (QID) | INTRAMUSCULAR | Status: AC
  Administered 2013-07-08 – 2013-07-09 (×4): 4 mg via INTRAVENOUS
  Filled 2013-07-07 (×4): qty 1

## 2013-07-07 MED ORDER — LACTATED RINGERS IV SOLN: INTRAVENOUS | Status: DC

## 2013-07-07 MED ORDER — HYDROCODONE-ACETAMINOPHEN 5-325 MG PO TABS: 1.0000 | ORAL_TABLET | ORAL | Status: DC | PRN

## 2013-07-07 MED ORDER — PROPOFOL 10 MG/ML IV BOLUS
INTRAVENOUS | Status: AC
  Filled 2013-07-07: qty 20

## 2013-07-07 MED ORDER — 0.9 % SODIUM CHLORIDE (POUR BTL) OPTIME
TOPICAL | Status: DC | PRN
  Administered 2013-07-07 (×2): 1000 mL

## 2013-07-07 MED ORDER — CEFAZOLIN SODIUM-DEXTROSE 2-3 GM-% IV SOLR
INTRAVENOUS | Status: DC | PRN
  Administered 2013-07-07: 2 g via INTRAVENOUS

## 2013-07-07 MED ORDER — MIDAZOLAM HCL 2 MG/2ML IJ SOLN
INTRAMUSCULAR | Status: AC
  Filled 2013-07-07: qty 2

## 2013-07-07 MED ORDER — DEXAMETHASONE SODIUM PHOSPHATE 10 MG/ML IJ SOLN
6.0000 mg | Freq: Four times a day (QID) | INTRAMUSCULAR | Status: AC
  Administered 2013-07-07 – 2013-07-08 (×4): 6 mg via INTRAVENOUS
  Filled 2013-07-07 (×2): qty 0.6
  Filled 2013-07-07 (×2): qty 1

## 2013-07-07 MED ORDER — ONDANSETRON HCL 4 MG/2ML IJ SOLN
INTRAMUSCULAR | Status: AC
  Filled 2013-07-07: qty 2

## 2013-07-07 MED ORDER — FENTANYL CITRATE 0.05 MG/ML IJ SOLN
INTRAMUSCULAR | Status: AC
  Filled 2013-07-07: qty 5

## 2013-07-07 MED ORDER — GLYCOPYRROLATE 0.2 MG/ML IJ SOLN
INTRAMUSCULAR | Status: AC
  Filled 2013-07-07: qty 4

## 2013-07-07 MED ORDER — PANTOPRAZOLE SODIUM 40 MG IV SOLR
40.0000 mg | Freq: Every day | INTRAVENOUS | Status: DC
  Administered 2013-07-07: 40 mg via INTRAVENOUS
  Filled 2013-07-07 (×2): qty 40

## 2013-07-07 MED ORDER — ROCURONIUM BROMIDE 100 MG/10ML IV SOLN
INTRAVENOUS | Status: DC | PRN
  Administered 2013-07-07: 30 mg via INTRAVENOUS
  Administered 2013-07-07: 50 mg via INTRAVENOUS

## 2013-07-07 MED ORDER — HYDROMORPHONE HCL PF 1 MG/ML IJ SOLN: 0.2500 mg | INTRAMUSCULAR | Status: DC | PRN

## 2013-07-07 MED ORDER — BISACODYL 10 MG RE SUPP: 10.0000 mg | Freq: Every day | RECTAL | Status: DC | PRN

## 2013-07-07 MED ORDER — CEFAZOLIN SODIUM 1-5 GM-% IV SOLN
1.0000 g | Freq: Three times a day (TID) | INTRAVENOUS | Status: AC
  Administered 2013-07-08 (×2): 1 g via INTRAVENOUS
  Filled 2013-07-07 (×2): qty 50

## 2013-07-07 MED ORDER — ONDANSETRON HCL 4 MG/2ML IJ SOLN
INTRAMUSCULAR | Status: DC | PRN
  Administered 2013-07-07: 4 mg via INTRAVENOUS

## 2013-07-07 MED ORDER — PHENYLEPHRINE 40 MCG/ML (10ML) SYRINGE FOR IV PUSH (FOR BLOOD PRESSURE SUPPORT)
PREFILLED_SYRINGE | INTRAVENOUS | Status: AC
  Filled 2013-07-07: qty 10

## 2013-07-07 MED ORDER — ACETAMINOPHEN 325 MG PO TABS: 650.0000 mg | ORAL_TABLET | ORAL | Status: DC | PRN

## 2013-07-07 MED ORDER — FENTANYL CITRATE 0.05 MG/ML IJ SOLN
INTRAMUSCULAR | Status: DC | PRN
  Administered 2013-07-07 (×2): 100 ug via INTRAVENOUS
  Administered 2013-07-07: 50 ug via INTRAVENOUS

## 2013-07-07 MED ORDER — ACETAMINOPHEN 650 MG RE SUPP: 650.0000 mg | RECTAL | Status: DC | PRN

## 2013-07-07 MED ORDER — ETOMIDATE 2 MG/ML IV SOLN
INTRAVENOUS | Status: DC | PRN
  Administered 2013-07-07: 8 mg via INTRAVENOUS

## 2013-07-07 MED ORDER — LABETALOL HCL 5 MG/ML IV SOLN
INTRAVENOUS | Status: AC
  Filled 2013-07-07: qty 4

## 2013-07-07 MED ORDER — HYDROMORPHONE HCL PF 1 MG/ML IJ SOLN
INTRAMUSCULAR | Status: AC
  Filled 2013-07-07: qty 1

## 2013-07-07 MED ORDER — DEXAMETHASONE SODIUM PHOSPHATE 4 MG/ML IJ SOLN
4.0000 mg | Freq: Three times a day (TID) | INTRAMUSCULAR | Status: DC
  Administered 2013-07-09 – 2013-07-10 (×3): 4 mg via INTRAVENOUS
  Filled 2013-07-07 (×6): qty 1

## 2013-07-07 MED ORDER — OXYCODONE HCL 5 MG PO TABS
ORAL_TABLET | ORAL | Status: AC
  Filled 2013-07-07: qty 1

## 2013-07-07 MED ORDER — MIDAZOLAM HCL 5 MG/5ML IJ SOLN
INTRAMUSCULAR | Status: DC | PRN
  Administered 2013-07-07 (×2): 1 mg via INTRAVENOUS

## 2013-07-07 MED ORDER — MORPHINE SULFATE 2 MG/ML IJ SOLN: 1.0000 mg | INTRAMUSCULAR | Status: DC | PRN

## 2013-07-07 MED ORDER — ONDANSETRON HCL 4 MG PO TABS: 4.0000 mg | ORAL_TABLET | ORAL | Status: DC | PRN

## 2013-07-07 MED ORDER — NEOSTIGMINE METHYLSULFATE 1 MG/ML IJ SOLN
INTRAMUSCULAR | Status: DC | PRN
  Administered 2013-07-07: 5 mg via INTRAVENOUS

## 2013-07-07 MED ORDER — METOCLOPRAMIDE HCL 5 MG/ML IJ SOLN
10.0000 mg | Freq: Once | INTRAMUSCULAR | Status: DC | PRN
  Filled 2013-07-07: qty 2

## 2013-07-07 MED ORDER — PHENYLEPHRINE HCL 10 MG/ML IJ SOLN
10.0000 mg | INTRAMUSCULAR | Status: DC | PRN
  Administered 2013-07-07: 20 ug/min via INTRAVENOUS

## 2013-07-07 MED ORDER — HEMOSTATIC AGENTS (NO CHARGE) OPTIME
TOPICAL | Status: DC | PRN
  Administered 2013-07-07: 1 via TOPICAL

## 2013-07-07 MED ORDER — LABETALOL HCL 5 MG/ML IV SOLN
INTRAVENOUS | Status: DC | PRN
  Administered 2013-07-07 (×4): 10 mg via INTRAVENOUS

## 2013-07-07 MED ORDER — OXYCODONE HCL 5 MG PO TABS: 5.0000 mg | ORAL_TABLET | Freq: Once | ORAL | Status: DC | PRN

## 2013-07-07 MED ORDER — SODIUM CHLORIDE 0.9 % IV SOLN
INTRAVENOUS | Status: DC | PRN
  Administered 2013-07-07: 16:00:00 via INTRAVENOUS

## 2013-07-07 MED ORDER — ETOMIDATE 2 MG/ML IV SOLN
INTRAVENOUS | Status: AC
  Filled 2013-07-07: qty 10

## 2013-07-07 MED ORDER — PROPOFOL 10 MG/ML IV EMUL
0.0000 ug/kg/min | INTRAVENOUS | Status: DC
  Administered 2013-07-07: 20 ug/kg/min via INTRAVENOUS
  Administered 2013-07-08: 25 ug/kg/min via INTRAVENOUS
  Filled 2013-07-07 (×2): qty 100

## 2013-07-07 MED ORDER — CHLORHEXIDINE GLUCONATE 0.12 % MT SOLN
15.0000 mL | Freq: Two times a day (BID) | OROMUCOSAL | Status: DC
Start: 2013-07-07 — End: 2013-07-08
  Administered 2013-07-07: 15 mL via OROMUCOSAL
  Filled 2013-07-07: qty 15

## 2013-07-07 MED ORDER — SODIUM CHLORIDE 0.9 % IV SOLN
INTRAVENOUS | Status: DC
Start: 2013-07-07 — End: 2013-07-11

## 2013-07-07 MED ORDER — PHENYLEPHRINE HCL 10 MG/ML IJ SOLN
INTRAMUSCULAR | Status: DC | PRN
  Administered 2013-07-07: 80 ug via INTRAVENOUS

## 2013-07-07 MED ORDER — PROMETHAZINE HCL 25 MG PO TABS: 12.5000 mg | ORAL_TABLET | ORAL | Status: DC | PRN

## 2013-07-07 MED ORDER — BUPIVACAINE-EPINEPHRINE 0.5% -1:200000 IJ SOLN
INTRAMUSCULAR | Status: DC | PRN
  Administered 2013-07-07: 10 mL

## 2013-07-07 MED ORDER — NEOSTIGMINE METHYLSULFATE 1 MG/ML IJ SOLN
INTRAMUSCULAR | Status: AC
  Filled 2013-07-07: qty 10

## 2013-07-07 MED ORDER — BACITRACIN ZINC 500 UNIT/GM EX OINT
TOPICAL_OINTMENT | CUTANEOUS | Status: DC | PRN
  Administered 2013-07-07 (×2): 1 via TOPICAL

## 2013-07-07 MED ORDER — LIDOCAINE HCL (CARDIAC) 20 MG/ML IV SOLN
INTRAVENOUS | Status: AC
  Filled 2013-07-07: qty 5

## 2013-07-07 MED ORDER — FLEET ENEMA 7-19 GM/118ML RE ENEM
1.0000 | ENEMA | Freq: Once | RECTAL | Status: AC | PRN
  Filled 2013-07-07: qty 1

## 2013-07-07 MED ORDER — OXYCODONE HCL 5 MG/5ML PO SOLN: 5.0000 mg | Freq: Once | ORAL | Status: DC | PRN

## 2013-07-07 MED ORDER — CEFAZOLIN SODIUM-DEXTROSE 2-3 GM-% IV SOLR
INTRAVENOUS | Status: AC
  Filled 2013-07-07: qty 50

## 2013-07-07 SURGICAL SUPPLY — 88 items
BAG DECANTER FOR FLEXI CONT (MISCELLANEOUS) ×3 IMPLANT
BENZOIN TINCTURE PRP APPL 2/3 (GAUZE/BANDAGES/DRESSINGS) ×3 IMPLANT
BLADE CLIPPER SURG NEURO (BLADE) ×3 IMPLANT
BLADE SURG 15 STRL LF DISP TIS (BLADE) ×1 IMPLANT
BLADE SURG 15 STRL SS (BLADE) ×2
BLADE ULTRA TIP 2M (BLADE) IMPLANT
BRUSH SCRUB EZ 1% IODOPHOR (MISCELLANEOUS) IMPLANT
BRUSH SCRUB EZ PLAIN DRY (MISCELLANEOUS) ×3 IMPLANT
BUR ACORN 6.0 PRECISION (BURR) ×2 IMPLANT
BUR ACORN 6.0MM PRECISION (BURR) ×1
BUR MATCHSTICK NEURO 3.0 LAGG (BURR) ×3 IMPLANT
CANISTER SUCT 3000ML (MISCELLANEOUS) ×3 IMPLANT
CLIP TI MEDIUM 6 (CLIP) IMPLANT
CLOSURE WOUND 1/2 X4 (GAUZE/BANDAGES/DRESSINGS) ×1
CONT SPEC 4OZ CLIKSEAL STRL BL (MISCELLANEOUS) ×6 IMPLANT
CORDS BIPOLAR (ELECTRODE) ×3 IMPLANT
COVER MAYO STAND STRL (DRAPES) ×3 IMPLANT
DRAIN SNY WOU 7FLT (WOUND CARE) IMPLANT
DRAPE LAPAROTOMY 100X72 PEDS (DRAPES) ×3 IMPLANT
DRAPE MICROSCOPE LEICA (MISCELLANEOUS) ×3 IMPLANT
DRAPE MICROSCOPE ZEISS OPMI (DRAPES) IMPLANT
DRAPE SURG 17X23 STRL (DRAPES) ×12 IMPLANT
DRAPE WARM FLUID 44X44 (DRAPE) ×3 IMPLANT
DRESSING TELFA 8X3 (GAUZE/BANDAGES/DRESSINGS) IMPLANT
DURAMATRIX ONLAY 2X2 (Neuro Prosthesis/Implant) ×3 IMPLANT
ELECT BLADE 4.0 EZ CLEAN MEGAD (MISCELLANEOUS)
ELECT CAUTERY BLADE 6.4 (BLADE) ×3 IMPLANT
ELECT REM PT RETURN 9FT ADLT (ELECTROSURGICAL) ×3
ELECTRODE BLDE 4.0 EZ CLN MEGD (MISCELLANEOUS) IMPLANT
ELECTRODE REM PT RTRN 9FT ADLT (ELECTROSURGICAL) ×1 IMPLANT
EVACUATOR 1/8 PVC DRAIN (DRAIN) IMPLANT
EVACUATOR SILICONE 100CC (DRAIN) IMPLANT
FORCEPS BIPOLAR SPETZLER 8 1.0 (NEUROSURGERY SUPPLIES) ×3 IMPLANT
GAUZE SPONGE 4X4 16PLY XRAY LF (GAUZE/BANDAGES/DRESSINGS) IMPLANT
GLOVE BIO SURGEON STRL SZ 6.5 (GLOVE) ×4 IMPLANT
GLOVE BIO SURGEON STRL SZ8 (GLOVE) ×3 IMPLANT
GLOVE BIO SURGEON STRL SZ8.5 (GLOVE) ×3 IMPLANT
GLOVE BIO SURGEONS STRL SZ 6.5 (GLOVE) ×2
GLOVE BIOGEL PI IND STRL 6.5 (GLOVE) ×1 IMPLANT
GLOVE BIOGEL PI IND STRL 8 (GLOVE) ×1 IMPLANT
GLOVE BIOGEL PI INDICATOR 6.5 (GLOVE) ×2
GLOVE BIOGEL PI INDICATOR 8 (GLOVE) ×2
GLOVE ECLIPSE 7.5 STRL STRAW (GLOVE) ×6 IMPLANT
GLOVE EXAM NITRILE LRG STRL (GLOVE) IMPLANT
GLOVE EXAM NITRILE MD LF STRL (GLOVE) IMPLANT
GLOVE EXAM NITRILE XL STR (GLOVE) IMPLANT
GLOVE EXAM NITRILE XS STR PU (GLOVE) IMPLANT
GLOVE INDICATOR 8.5 STRL (GLOVE) ×3 IMPLANT
GLOVE SS BIOGEL STRL SZ 8 (GLOVE) ×1 IMPLANT
GLOVE SUPERSENSE BIOGEL SZ 8 (GLOVE) ×2
GOWN BRE IMP SLV AUR LG STRL (GOWN DISPOSABLE) IMPLANT
GOWN BRE IMP SLV AUR XL STRL (GOWN DISPOSABLE) IMPLANT
GOWN STRL REUS W/ TWL LRG LVL3 (GOWN DISPOSABLE) ×1 IMPLANT
GOWN STRL REUS W/ TWL XL LVL3 (GOWN DISPOSABLE) ×2 IMPLANT
GOWN STRL REUS W/TWL 2XL LVL3 (GOWN DISPOSABLE) ×3 IMPLANT
GOWN STRL REUS W/TWL LRG LVL3 (GOWN DISPOSABLE) ×2
GOWN STRL REUS W/TWL XL LVL3 (GOWN DISPOSABLE) ×4
KIT BASIN OR (CUSTOM PROCEDURE TRAY) ×3 IMPLANT
KIT ROOM TURNOVER OR (KITS) ×3 IMPLANT
NEEDLE HYPO 22GX1.5 SAFETY (NEEDLE) ×3 IMPLANT
NS IRRIG 1000ML POUR BTL (IV SOLUTION) ×6 IMPLANT
PACK CRANIOTOMY (CUSTOM PROCEDURE TRAY) ×3 IMPLANT
PAD ARMBOARD 7.5X6 YLW CONV (MISCELLANEOUS) ×6 IMPLANT
PATTIES SURGICAL 1/4 X 3 (GAUZE/BANDAGES/DRESSINGS) IMPLANT
PIN MAYFIELD SKULL DISP (PIN) ×3 IMPLANT
RUBBERBAND STERILE (MISCELLANEOUS) ×6 IMPLANT
SET TUBING W/EXT DISP (INSTRUMENTS) ×3 IMPLANT
SPONGE GAUZE 4X4 12PLY (GAUZE/BANDAGES/DRESSINGS) ×3 IMPLANT
SPONGE LAP 4X18 X RAY DECT (DISPOSABLE) IMPLANT
STAPLER SKIN PROX WIDE 3.9 (STAPLE) ×3 IMPLANT
STRIP CLOSURE SKIN 1/2X4 (GAUZE/BANDAGES/DRESSINGS) ×2 IMPLANT
SUT ETHILON 2 0 FS 18 (SUTURE) IMPLANT
SUT ETHILON 3 0 FSL (SUTURE) IMPLANT
SUT NURALON 4 0 TR CR/8 (SUTURE) ×3 IMPLANT
SUT PROLENE 6 0 BV (SUTURE) IMPLANT
SUT VIC AB 1 CT1 18XBRD ANBCTR (SUTURE) ×1 IMPLANT
SUT VIC AB 1 CT1 8-18 (SUTURE) ×2
SUT VIC AB 2-0 CP2 18 (SUTURE) ×3 IMPLANT
SUT VIC AB 3-0 SH 8-18 (SUTURE) ×3 IMPLANT
SYR 20ML ECCENTRIC (SYRINGE) ×3 IMPLANT
SYR CONTROL 10ML LL (SYRINGE) ×3 IMPLANT
TAPE CLOTH SURG 4X10 WHT LF (GAUZE/BANDAGES/DRESSINGS) ×3 IMPLANT
TIP STRAIGHT 25KHZ (INSTRUMENTS) ×3 IMPLANT
TOWEL OR 17X24 6PK STRL BLUE (TOWEL DISPOSABLE) IMPLANT
TOWEL OR 17X26 10 PK STRL BLUE (TOWEL DISPOSABLE) ×3 IMPLANT
TRAY FOLEY CATH 14FRSI W/METER (CATHETERS) ×3 IMPLANT
UNDERPAD 30X30 INCONTINENT (UNDERPADS AND DIAPERS) IMPLANT
WATER STERILE IRR 1000ML POUR (IV SOLUTION) ×3 IMPLANT

## 2013-07-07 NOTE — Transfer of Care (Signed)
Immediate Anesthesia Transfer of Care Note  Patient: Rachael Wall  Procedure(s) Performed: Procedure(s): Left suboccipital craniectomy for resection of tumor  (N/A)  Patient Location: NICU  Anesthesia Type:General  Level of Consciousness: Patient remains intubated per anesthesia plan  Airway & Oxygen Therapy: Patient remains intubated per anesthesia plan and Patient placed on Ventilator (see vital sign flow sheet for setting)  Post-op Assessment: Report given to PACU RN and Post -op Vital signs reviewed and stable  Post vital signs: Reviewed and stable  Complications: No apparent anesthesia complications

## 2013-07-07 NOTE — Anesthesia Preprocedure Evaluation (Signed)
Anesthesia Evaluation  Patient identified by MRN, date of birth, ID band Patient awake    Reviewed: Allergy & Precautions, H&P , NPO status , Patient's Chart, lab work & pertinent test results, reviewed documented beta blocker date and time   Airway Mallampati: II TM Distance: >3 FB Neck ROM: full    Dental   Pulmonary shortness of breath, COPD COPD inhaler, former smoker,  breath sounds clear to auscultation        Cardiovascular negative cardio ROS  Rhythm:regular     Neuro/Psych negative neurological ROS  negative psych ROS   GI/Hepatic negative GI ROS, Neg liver ROS,   Endo/Other  negative endocrine ROS  Renal/GU negative Renal ROS  negative genitourinary   Musculoskeletal   Abdominal   Peds  Hematology negative hematology ROS (+)   Anesthesia Other Findings See surgeon's H&P   Reproductive/Obstetrics negative OB ROS                           Anesthesia Physical Anesthesia Plan  ASA: IV  Anesthesia Plan: General   Post-op Pain Management:    Induction: Intravenous  Airway Management Planned: Oral ETT  Additional Equipment: Arterial line, CVP and Ultrasound Guidance Line Placement  Intra-op Plan:   Post-operative Plan: Possible Post-op intubation/ventilation  Informed Consent: I have reviewed the patients History and Physical, chart, labs and discussed the procedure including the risks, benefits and alternatives for the proposed anesthesia with the patient or authorized representative who has indicated his/her understanding and acceptance.   Dental Advisory Given  Plan Discussed with: CRNA and Surgeon  Anesthesia Plan Comments:         Anesthesia Saini Evaluation

## 2013-07-07 NOTE — Progress Notes (Signed)
Subjective:  The patient is somnolent but arousable. She is in no apparent distress.  Objective: Vital signs in last 24 hours: Temp:  [97.3 F (36.3 C)-98.5 F (36.9 C)] 97.9 F (36.6 C) (03/16 1352) Pulse Rate:  [83-98] 96 (03/16 1352) Resp:  [18] 18 (03/16 1352) BP: (128-152)/(74-78) 128/78 mmHg (03/16 1352) SpO2:  [94 %-96 %] 96 % (03/16 1352) Weight:  [49.244 kg (108 lb 9 oz)] 49.244 kg (108 lb 9 oz) (03/16 0500)  Intake/Output from previous day:   Intake/Output this shift:    Physical exam the patient is somnolent but arousable. She will move all 4 extremities. Her pupils are miotic and equal  Lab Results:  Recent Labs  07/06/13 1000 07/07/13 0542  WBC 12.9* 12.2*  HGB 10.8* 10.3*  HCT 31.5* 30.1*  PLT 362 356   BMET  Recent Labs  07/06/13 1000 07/07/13 0542  NA 131* 132*  K 3.8 4.1  CL 93* 94*  CO2 22 24  GLUCOSE 118* 116*  BUN 16 18  CREATININE 0.53 0.53  CALCIUM 9.7 9.4    Studies/Results: No results found.  Assessment/Plan: The patient appears to be doing well. She is a bit sleepy is going to be taken to the ICU intubated. He will likely be extubated in the ICU.  LOS: 5 days     Lulani Bour D 07/07/2013, 8:10 PM

## 2013-07-07 NOTE — Op Note (Signed)
Brief history: The patient is a 73 year old white female who was admitted with shortness of breath and weight loss. She was found to have a lung mass with metastasis. 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 I discussed situation with the patient and her family. We discussed the various treatment options including surgery. She has weighed the risks, benefits, and alternatives surgery and decided proceed with a suboccipital craniectomy with resection of tumor.  Preop diagnosis: Left cerebellar metastasis, hydrocephalus  Postop diagnosis: The same  Procedure: Left suboccipital craniectomy for posterior resection of cerebellar tumor using microdissection  Surgeon: Dr. Earle Gell  Assistant: Dr. Dominica Severin cram  Estimated blood loss: 100 cc  Specimens: Tumor  Drains: None  Complications: None  Description of procedure: The patient was brought to the operating room by the anesthesia team. General endotracheal anesthesia was induced. I applied the [place] 3 point headrest the patient's calvarium. The patient was turned to the prone position on the chest rolls. The patient's suboccipital region was then shaved with clippers and this region in her upper neck was then prepared with Betadine scrub and Betadine solution. Sterile drapes were applied. I then injected the area to be incised with Marcaine with epinephrine solution. I then used a scalpel to make a decision just to the left of midline in the suboccipital region. I used electrocautery to dissect deeper and cut the fascia. I perform a sub-Prevacid dissection exposing the left suboccipital region up to the midline. We used the Applebaum retractor for exposure. I then used a high-speed drill to thin the left suboccipital region. I then completed the suboccipital craniectomy with a Kerrison punches. I then incised the underlying dura in a cruciate  fashion. We tacked epidural edges. We then brought the operative microscope into the field and under its magnification and illumination we completed the microdissection. I used the bipolar cautery and suction 2 create a left cerebellar corticotomy. I dissected deeper and encountered a tumor at approximately 1 cm. Using suction and the Sonopet we internally debulked the tumor. This gave [place] a working room to dissect around the periphery of the tumor. In doing this we are able to get what we feel was a a gross total resection of the tumor. The tumor specimen was a bit smaller than expected but I think this is secondary to removing a good bit of the tumor with suction and the Sonopet. We carefully inspected the tumor resection cavity. We do not see any more tumor. We then obtained hemostasis using bipolar electrocautery. We irrigated the resection  cavity until it came back crystal clear. We lined the tumor resection cavity with Surgicel. We then removed the retractor and reapproximated patient's dura using interrupted 4-0 nylon suture. We laid dural matrix over the exposed dura. We then reapproximated patient's cervical fascia with interrupted 0 Vicryl suture. We reapproximated the skin with interrupted 2-0 Vicryl suture. We reapproximated the skin with a running 3-0 nylon suture. The wound was then coated with bacitracin ointment. A sterile dressing was applied. The drapes were removed. The patient was subsequently returned to the supine position. I then removed the Mayfield 3 point headrest from the patient's calvarium. By report all sponge, instrument, and needle counts were correct at the end this case.

## 2013-07-07 NOTE — Progress Notes (Signed)
Name: Rachael Wall MRN: [id number] DOB: [birth year]    ADMISSION DATE:  07/02/2013 SUBJECTIVE:  ADMISSION DATE: 07/02/2013   CONSULTATION DATE: 07/07/13   REFERRING MD : Grandville Silos   PRIMARY SERVICE: Family Med  CHIEF COMPLAINT: Lung Mass   BRIEF PATIENT DESCRIPTION: 73 y.o. F no pertinent PMH presented 3/11 with complaints of worsening SOB. CT chest showed new RLL lung mass with mets to brain, liver, adrenals, lymph nodes. PCCM consulted.   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  3/16: S/p suboccipital Craniectomy for resection of metastatic cerebellar tumor   LINES / TUBES:  None   CULTURES:  None   ANTIBIOTICS:  Ancef 3/16: per surg X 2 doses   VITAL SIGNS: Temp:  [97.3 F (36.3 C)-98.5 F (36.9 C)] 98.1 F (36.7 C) (03/16 2025) Pulse Rate:  [81-98] 81 (03/16 2030) Resp:  [16-18] 18 (03/16 2030) BP: (128-173)/(74-78) 173/76 mmHg (03/16 2025) SpO2:  [94 %-100 %] 100 % (03/16 2030) Arterial Line BP: (183-189)/(82) 183/82 mmHg (03/16 2030) Weight:  [49.244 kg (108 lb 9 oz)] 49.244 kg (108 lb 9 oz) (03/16 0500) HEMODYNAMICS:   VENTILATOR SETTINGS:   INTAKE / OUTPUT: Intake/Output     03/16 0701 - 03/17 0700   I.V. (mL/kg) 1700 (34.5)   Total Intake(mL/kg) 1700 (34.5)   Urine (mL/kg/hr) 50 (0.1)   Blood 200 (0.3)   Total Output 250   Net +1450       Urine Occurrence 1 x   Stool Occurrence 1 x     PHYSICAL EXAMINATION: General: Pleasant female, resting in bed post-op.  Neuro: awake, moves all ext, equal strength  HEENT: [place]/AT. PERRL, sclerae anicteric. Orally intubated Cardiovascular: RRR, no M/R/G.  Lungs: Respirations even and unlabored. CTA bilaterally, No W/R/R.  Abdomen: BS x 4, +  hepatomegaly, tender in RUQ.  Musculoskeletal: No gross deformities, no edema.  Skin: Intact, warm, no rashes.   LABS:  CBC  Recent Labs Lab 07/04/13 0410 07/06/13 1000 07/07/13 0542  WBC 9.4 12.9* 12.2*  HGB 10.3* 10.8* 10.3*  HCT 30.7* 31.5* 30.1*  PLT 328 362 356   Coag's  Recent Labs Lab 07/02/13 1635 07/03/13 0455 07/07/13 0542  INR 1.07 1.22 1.09   BMET  Recent Labs Lab 07/04/13 0410 07/06/13 1000 07/07/13 0542  NA 134* 131* 132*  K 4.1 3.8 4.1  CL 97 93* 94*  CO2 21 22 24   BUN 15 16 18   CREATININE 0.54 0.53 0.53  GLUCOSE 79 118* 116*   Electrolytes  Recent Labs Lab 07/04/13 0410 07/06/13 1000 07/07/13 0542  CALCIUM 9.6 9.7 9.4   Sepsis Markers No results found for this basename: LATICACIDVEN, PROCALCITON, O2SATVEN,  in the last 168 hours ABG No results found for this basename: PHART, PCO2ART, PO2ART,  in the last 168 hours Liver Enzymes  Recent Labs Lab 07/02/13 1541  AST 85*  ALT 27  ALKPHOS 260*  BILITOT 0.6  ALBUMIN 3.7   Cardiac Enzymes No results found for this basename: TROPONINI, PROBNP,  in the last 168 hours Glucose No results found for this basename: GLUCAP,  in the last 168 hours  Imaging No results found.   CXR: pending   ASSESSMENT / PLAN:  PULMONARY A:  Acute respiratory failure.  Likely residual anesthesia affect. Only pulled 150 ml Vt on SBT trial on arrival to ICU Lung mass (large cell poorly diff neuroendocrine) with extensive adenopathy and mets to liver, adrenals, and brain  P:   Post-op CXR, assure ETT positioning Full vent support over night w/ PAD protocol (will use diprivan) ABG Assess SBT in am   CARDIOVASCULAR A:  HTN: possibly pain induced.  P:  Try analgesia and diprivan first PRN hydralazine (Surgery has requested SBP goal <160)  RENAL A:   Hyponatremia: stable P:   Keep even volume status Ck am chemistry   GASTROINTESTINAL A:  Protein cal malnutrition       Liver mets       Dysphagia  P:   NPO Resume protein supplementation post-op    HEMATOLOGIC A:  Lung mass (large cell poorly diff neuroendocrine) with extensive adenopathy and mets to liver, adrenals, and brain      Mild anemia   P:  PAS F/u cbc in am   INFECTIOUS A:  No acute  P:   Trend fever curve   ENDOCRINE A:  Steroid induced hyperglycemia  P:   ssi if glucose >150   NEUROLOGIC A:  S/p suboccipital Craniectomy for resection of metastatic cerebellar tumor  P:   Post-op per neuro-surg   TODAY'S SUMMARY:  Likely residual anesthesia affect. Only pulled 150 ml Vt on SBT trial on arrival to ICU. Will go ahead, check CXR, cont supportive care. Should be able to wean to extubate in am. Goals of care discussed at bedside with nursing and respiratory care team   I have personally obtained a history, examined the patient, evaluated laboratory and imaging results, formulated the assessment and plan and placed orders. CRITICAL CARE: The patient is critically ill with multiple organ systems failure and requires high complexity decision making for assessment and support, frequent evaluation and titration of therapies, application of advanced monitoring technologies and extensive interpretation of multiple databases. Critical Care Time devoted to patient care services described in this note is 40 minutes.    Pulmonary and [place] Pager: [phone]  07/07/2013, 8:40 PM  Not yet extubatable.  Will SBT in AM and address accordingly.  Additional CC time 40 min.  Patient seen and examined, agree with above note.  I dictated the care and orders written for this patient under my direction.  Rush Farmer, MD [phone]

## 2013-07-07 NOTE — Progress Notes (Signed)
   Name: Rachael Wall MRN: [id number] DOB: November 07, 1940    ADMISSION DATE:  07/02/2013 CONSULTATION DATE:  07/07/13  REFERRING MD :  Grandville Silos PRIMARY SERVICE:  Family Med  CHIEF COMPLAINT:  Lung Mass  BRIEF PATIENT DESCRIPTION: 73 y.o. F no pertinent PMH presented 3/11 with complaints of worsening SOB.  CT chest showed new RLL lung mass with mets to brain, liver, adrenals, lymph nodes.  PCCM consulted.  SIGNIFICANT EVENTS / STUDIES:  3/11 Chest CTA: new RLL lung mass, probable mets to liver and adrenals, compression of SVC 3/13 Brain MRI: mass in left cerebellum, mass effect with effacement of 4th ventricle resulting in mod hydrocephalus. 3/12 Liver Bx:  metastatic carcinoma.  LINES / TUBES: None  CULTURES: None  ANTIBIOTICS: None   SUBJECTIVE: No SOB, chest pain, N/V/D.  Mild abdominal pain in RUQ.  NPO for OR this PM.  VITAL SIGNS: Temp:  [97.3 F (36.3 C)-98.5 F (36.9 C)] 98.5 F (36.9 C) (03/16 1038) Pulse Rate:  [83-98] 98 (03/16 1038) Resp:  [18] 18 (03/16 1038) BP: (122-152)/(70-82) 152/76 mmHg (03/16 1038) SpO2:  [94 %-96 %] 94 % (03/16 1038) Weight:  [49.244 kg (108 lb 9 oz)] 49.244 kg (108 lb 9 oz) (03/16 0500)  PHYSICAL EXAMINATION: General: Pleasant female, resting in bed, in NAD. Neuro: A&O x 3, non-focal.  HEENT: [place]/AT. PERRL, sclerae anicteric. Cardiovascular: RRR, no M/R/G.  Lungs: Respirations even and unlabored.  CTA bilaterally, No W/R/R. Abdomen: BS x 4, + hepatomegaly, tender in RUQ. Musculoskeletal: No gross deformities, no edema.  Skin: Intact, warm, no rashes.   Recent Labs Lab 07/04/13 0410 07/06/13 1000 07/07/13 0542  NA 134* 131* 132*  K 4.1 3.8 4.1  CL 97 93* 94*  CO2 21 22 24   BUN 15 16 18   CREATININE 0.54 0.53 0.53  GLUCOSE 79 118* 116*    Recent Labs Lab 07/04/13 0410 07/06/13 1000 07/07/13 0542  HGB 10.3* 10.8* 10.3*  HCT 30.7* 31.5* 30.1*  WBC 9.4 12.9* 12.2*  PLT 328 362 356   No results found.  ASSESSMENT /  PLAN:  Lung mass (large cell poorly diff neuroendocrine) with extensive adenopathy and mets to liver, adrenals, and brain Mild hyponatremia Steroid induced hyperglycemia Plan: - Supplemental O2 as needed. - Continue Symbicort and Mucinex. - IS. - Pain control. - Monitor glucose. - CXR in AM following surgery this PM.  PCCM will follow while in ICU post operatively  Merton Border, MD ; [facility] service [place] [phone].  After 5:30 PM or weekends, call [phone]  Seen with: Montey Hora, [place] [facility] Pgr: [phone]  or [phone]

## 2013-07-07 NOTE — Consult Note (Deleted)
Name: Rachael Wall MRN: [id number] DOB: Sep 04, 1940    ADMISSION DATE:  07/02/2013 CONSULTATION DATE:  07/07/13  REFERRING MD :  Grandville Silos PRIMARY SERVICE:  Family Med  CHIEF COMPLAINT:  Lung Mass  BRIEF PATIENT DESCRIPTION: 73 y.o. F no pertinent PMH presented 3/11 with complaints of worsening SOB.  CT chest showed new RLL lung mass with mets to brain, liver, adrenals, lymph nodes.  PCCM consulted.  SIGNIFICANT EVENTS / STUDIES:  3/11 >>> presented to ED with SOB. 3/11 Chest CTA >>> new RLL lung mass, probable mets to liver and adrenals, compression of SVC 3/13 Brain MRI >>> mass in left cerebellum (concern mets from primary [place] lung CA), mass effect with effacement of 4th ventricle resulting in mod hydrocephalus. 3/12 Liver Bx >>> metastatic carcinoma.  LINES / TUBES: None  CULTURES: None  ANTIBIOTICS: None  HISTORY OF PRESENT ILLNESS:  Rachael Wall is a 73 yo female with no significant PMH, presents with over a month of SOB; however, much worse over a few days prior to admission.  She was mainly symptomatic during exertion, described as SOB with a "tightness" in her lower chest/upper abdomen.  In addition, over the past few months she has had "a lot" of unintentional weight loss and has not been eating well due to lack of appetite.  She had not experienced any fevers/chills/sweats, chest pain, wheezing, hemoptysis, N/V/D, headaches, confusion, blurry vision, dysuria, myalgias, lower extremity edema. She visited her PCP prior to coming to the ED for the same symptoms, PCP ordered chest CT hich showed a large lung mass.  Once pt heard results, she decided to come to the hospital.  She does have a strong smoking history, roughly 1 - 1.5 ppd x 35 years.  Reports that she quit smoking about 1 month ago due to worsening SOB.  No other known risk factors.  CTA done in ED demonstrated RLL mass with likely hepatic and adrenal mets.  MRI done next day demonstrated a mass in left cerebellum with  mass effect and effacement of 4th ventricle causing moderate hydrocephalus. Neurosurgery is planning to take pt to the OR this afternoon (3/16) for suboccipital craniectomy and resection of cerebellar metastasis.   PAST MEDICAL HISTORY :  Past Medical History  Diagnosis Date  . Diverticulitis    History reviewed. No pertinent past surgical history. Prior to Admission medications   Medication Sig Start Date End Date Taking? Authorizing Provider  budesonide-formoterol (SYMBICORT) 160-4.5 MCG/ACT inhaler Inhale 2 puffs into the lungs See admin instructions. Every 9 hours.   Yes Historical Provider, MD  dextromethorphan-guaiFENesin (MUCINEX DM) 30-600 MG per 12 hr tablet Take 1 tablet by mouth 2 (two) times daily.   Yes Historical Provider, MD  Ferrous Sulfate (IRON) 325 (65 FE) MG TABS Take 1 tablet by mouth 3 (three) times daily.   Yes Historical Provider, MD  pantoprazole (PROTONIX) 40 MG tablet Take 40 mg by mouth 2 (two) times daily.   Yes Historical Provider, MD  azithromycin (ZITHROMAX) 250 MG tablet Take 250-500 mg by mouth daily. Take two tablets on day 1 and 1 tablet for the next 4 days.    Historical Provider, MD   No Known Allergies  FAMILY HISTORY:  History reviewed. No pertinent family history. SOCIAL HISTORY:  reports that she has quit smoking. She does not have any smokeless tobacco history on file. Her alcohol and drug histories are not on file.  REVIEW OF SYSTEMS:  Negative except as stated in HPI.  SUBJECTIVE: No SOB,  chest pain, N/V/D.  Mild abdominal pain in RUQ.  NPO for OR this PM.  VITAL SIGNS: Temp:  [97.3 F (36.3 C)-98 F (36.7 C)] 97.3 F (36.3 C) (03/16 0604) Pulse Rate:  [83-92] 83 (03/16 0604) Resp:  [18] 18 (03/16 0604) BP: (122-147)/(70-82) 137/77 mmHg (03/16 0604) SpO2:  [94 %-96 %] 95 % (03/16 0604) Weight:  [108 lb 9 oz (49.244 kg)] 108 lb 9 oz (49.244 kg) (03/16 0500)  PHYSICAL EXAMINATION: General: Pleasant female, resting in bed, in  NAD. Neuro: A&O x 3, non-focal.  HEENT: [place]/AT. PERRL, sclerae anicteric. Cardiovascular: RRR, no M/R/G.  Lungs: Respirations even and unlabored.  CTA bilaterally, No W/R/R. Abdomen: BS x 4, + hepatomegaly, tender in RUQ. Musculoskeletal: No gross deformities, no edema.  Skin: Intact, warm, no rashes.   Recent Labs Lab 07/04/13 0410 07/06/13 1000 07/07/13 0542  NA 134* 131* 132*  K 4.1 3.8 4.1  CL 97 93* 94*  CO2 21 22 24   BUN 15 16 18   CREATININE 0.54 0.53 0.53  GLUCOSE 79 118* 116*    Recent Labs Lab 07/04/13 0410 07/06/13 1000 07/07/13 0542  HGB 10.3* 10.8* 10.3*  HCT 30.7* 31.5* 30.1*  WBC 9.4 12.9* 12.2*  PLT 328 362 356   No results found.  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 Steroid induced hyperglycemia Plan: - Supplemental O2 as needed. - Continue Symbicort and Mucinex. - IS. - Pain control. - Monitor glucose. - CXR in AM following surgery this PM.   Montey Hora, PA - [facility] [place] [facility] Pgr: [phone]  or [phone]

## 2013-07-07 NOTE — Anesthesia Procedure Notes (Addendum)
Performed by: Raphael Gibney T   Procedure Name: Intubation Date/Time: 07/07/2013 5:28 PM Performed by: Ollen Bowl Pre-anesthesia Checklist: Patient identified, Timeout performed, Emergency Drugs available, Suction available and Patient being monitored Patient Re-evaluated:Patient Re-evaluated prior to inductionOxygen Delivery Method: Circle system utilized and Simple face mask Preoxygenation: Pre-oxygenation with 100% oxygen Intubation Type: IV induction Ventilation: Mask ventilation without difficulty Laryngoscope Size: Miller and 2 Grade View: Grade I Tube type: Subglottic suction tube Tube size: 7.5 mm Number of attempts: 1 Airway Equipment and Method: Patient positioned with wedge pillow and Stylet Placement Confirmation: ETT inserted through vocal cords under direct vision,  positive ETCO2 and breath sounds checked- equal and bilateral Secured at: 21 cm Tube secured with: Tape Dental Injury: Teeth and Oropharynx as per pre-operative assessment

## 2013-07-07 NOTE — Preoperative (Signed)
Beta Blockers   Reason not to administer Beta Blockers:Not Applicable 

## 2013-07-07 NOTE — Anesthesia Postprocedure Evaluation (Signed)
  Anesthesia Post-op Note  Patient: Rachael Wall  Procedure(s) Performed: Procedure(s): Left suboccipital craniectomy for resection of tumor  (N/A)  Patient Location: PACU and NICU  Anesthesia Type:General  Level of Consciousness: sedated  Airway and Oxygen Therapy: Patient remains intubated per anesthesia plan  Post-op Pain: mild  Post-op Assessment: Post-op Vital signs reviewed  Post-op Vital Signs: Reviewed  Complications: No apparent anesthesia complications

## 2013-07-07 NOTE — Progress Notes (Signed)
Subjective:  The patient is alert and pleasant.  Objective: Vital signs in last 24 hours: Temp:  [97.3 F (36.3 C)-98.5 F (36.9 C)] 97.9 F (36.6 C) (03/16 1352) Pulse Rate:  [83-98] 96 (03/16 1352) Resp:  [18] 18 (03/16 1352) BP: (128-152)/(74-82) 128/78 mmHg (03/16 1352) SpO2:  [94 %-96 %] 96 % (03/16 1352) Weight:  [49.244 kg (108 lb 9 oz)] 49.244 kg (108 lb 9 oz) (03/16 0500)  Intake/Output from previous day:   Intake/Output this shift:    Physical exam patient is alert and oriented. She is moving all 4 extremities.  Lab Results:  Recent Labs  07/06/13 1000 07/07/13 0542  WBC 12.9* 12.2*  HGB 10.8* 10.3*  HCT 31.5* 30.1*  PLT 362 356   BMET  Recent Labs  07/06/13 1000 07/07/13 0542  NA 131* 132*  K 3.8 4.1  CL 93* 94*  CO2 22 24  GLUCOSE 118* 116*  BUN 16 18  CREATININE 0.53 0.53  CALCIUM 9.7 9.4    Studies/Results: No results found.  Assessment/Plan: Left cerebellar tumor , hydrocephalus: I discussed situation with the patient. I've answered all her questions regarding surgery. She will to proceed with the operation.  LOS: 5 days     Nolin Grell D 07/07/2013, 4:28 PM

## 2013-07-07 NOTE — Progress Notes (Signed)
[facility] PROGRESS NOTE  Rachael Wall EHO:[id number] DOB: 01/10/1941 DOA: 07/02/2013 PCP: [name], MD  Assessment/Plan: Progressive dyspnea on exertion - this is likely due to metastatic disease in the setting of new lung mass, biopsy with metastatic carcinoma. Continue oxygen, Symbicort and Mucinex. Incentive spirometry. Per pulmonary and oncology. 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 Neurosurgery evaluated patient 3/13 and recommended surgery; patient initially not sure whether she wants surgery but she wants to proceed now. Patient for surgery today per neurosurgery.   Right lower lobe mass - with metastasis to liver, adrenals, lymph nodes, brain. Stage IV. Per oncology.  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  Weakness - PT consult.  Tobacco abuse, in remission - patient quit few months ago due to #1.      Code Status: Full Family Communication: Updated patient at bedside. Disposition Plan: Pending surgery.   Consultants:  Neurosurgery: Dr. Arnoldo Morale 07/04/2013  Oncology: Dr. Humphrey Rolls 07/04/2013  Interventional radiology 07/03/2013  Critical care medicine Dr. Chase Caller 07/02/2013  Procedures:  MRI brain 07/04/2013  Ultrasound guided core biopsy of the liver 07/03/2013  CT angiogram chest 07/02/2013    Antibiotics:  None  HPI/Subjective: Patient with no complaints. Patient denies any chest pain. Patient denies any shortness of breath.  Objective: Filed Vitals:   07/07/13 0604  BP: 137/77  Pulse: 83  Temp: 97.3 F (36.3 C)  Resp: 18   No intake or output data in the 24 hours ending 07/07/13 0926 Filed Weights   07/05/13 1900 07/06/13 0604 07/07/13 0500  Weight: 44.5 kg (98 lb 1.7 oz) 45.541 kg (100 lb 6.4 oz) 49.244 kg (108 lb 9 oz)    Exam:   General:  NAD  Cardiovascular: RRR  Respiratory: CTAB  Abdomen: Soft, nontender,  nondistended, positive bowel sounds.  Musculoskeletal: No clubbing cyanosis or edema.  Data Reviewed: Basic Metabolic Panel:  Recent Labs Lab 07/02/13 1541 07/03/13 0455 07/04/13 0410 07/06/13 1000 07/07/13 0542  NA 131* 135* 134* 131* 132*  K 4.8 4.5 4.1 3.8 4.1  CL 90* 97 97 93* 94*  CO2 21 21 21 22 24   GLUCOSE 96 81 79 118* 116*  BUN 20 17 15 16 18   CREATININE 0.73 0.61 0.54 0.53 0.53  CALCIUM 11.4* 9.9 9.6 9.7 9.4   Liver Function Tests:  Recent Labs Lab 07/02/13 1541  AST 85*  ALT 27  ALKPHOS 260*  BILITOT 0.6  PROT 8.2  ALBUMIN 3.7    Recent Labs Lab 07/02/13 1541  LIPASE 73*   No results found for this basename: AMMONIA,  in the last 168 hours CBC:  Recent Labs Lab 07/02/13 1541 07/03/13 0455 07/04/13 0410 07/06/13 1000 07/07/13 0542  WBC 12.6* 9.6 9.4 12.9* 12.2*  NEUTROABS 9.0*  --   --   --   --   HGB 12.9 10.1* 10.3* 10.8* 10.3*  HCT 36.8 30.6* 30.7* 31.5* 30.1*  MCV 86.8 87.4 87.5 87.0 86.0  PLT 416* 356 328 362 356   Cardiac Enzymes: No results found for this basename: CKTOTAL, CKMB, CKMBINDEX, TROPONINI,  in the last 168 hours BNP (last 3 results) No results found for this basename: PROBNP,  in the last 8760 hours CBG: No results found for this basename: GLUCAP,  in the last 168 hours  No results found for this or any previous visit (from the past 240 hour(s)).   Studies: No results found.  Scheduled Meds: .  acetylcysteine  3 mL Nebulization TID  . budesonide-formoterol  2 puff Inhalation BID  . dexamethasone  4 mg Intravenous 4 times per day  . feeding supplement (PRO-STAT SUGAR FREE 64)  30 mL Oral q morning - 10a  . guaiFENesin  1,200 mg Oral BID  . lactose free nutrition  237 mL Oral TID BM  . pantoprazole  40 mg Oral BID  . protein supplement  1 scoop Oral TID WC  . sodium chloride  3 mL Intravenous Q12H   Continuous Infusions:   Principal Problem:   SOB (shortness of breath) Active Problems:   Lung mass    Metastasis   Sinus tachycardia   Hyponatremia   Metastatic lung carcinoma   Unspecified protein-calorie malnutrition   Emphysema lung   Protein-calorie malnutrition, severe    Time spent: 62 mins    [facility] MD [facility] Pager [phone]. If 7PM-7AM, please contact night-coverage at [email or web address], password [facility] 07/07/2013, 9:26 AM  LOS: 5 days

## 2013-07-07 NOTE — Progress Notes (Signed)
eLink Physician-Brief Progress Note Patient Name: Rachael Wall DOB: 01/28/1941 MRN: [id number]  Date of Service  07/07/2013   HPI/Events of Note   Post op- vented  eICU Interventions  Vent/sedation orders given   Intervention Category Major Interventions: Other:  [name] V. 07/07/2013, 8:35 PM

## 2013-07-08 ENCOUNTER — Inpatient Hospital Stay (HOSPITAL_COMMUNITY): Payer: Medicare HMO

## 2013-07-08 ENCOUNTER — Encounter (HOSPITAL_COMMUNITY): Payer: Self-pay | Admitting: Neurosurgery

## 2013-07-08 DIAGNOSIS — J9600 Acute respiratory failure, unspecified whether with hypoxia or hypercapnia: Secondary | ICD-10-CM

## 2013-07-08 LAB — BASIC METABOLIC PANEL
BUN: 19 mg/dL (ref 6–23)
CO2: 22 mEq/L (ref 19–32)
Calcium: 8.8 mg/dL (ref 8.4–10.5)
Chloride: 98 mEq/L (ref 96–112)
Creatinine, Ser: 0.58 mg/dL (ref 0.50–1.10)
GFR calc Af Amer: >90 mL/min (ref >90)
GFR, EST NON AFRICAN AMERICAN: 89 mL/min — AB (ref >90)
Glucose, Bld: 105 mg/dL — ABNORMAL HIGH (ref 70–99)
POTASSIUM: 4.2 meq/L (ref 3.7–5.3)
SODIUM: 133 meq/L — AB (ref 137–147)

## 2013-07-08 LAB — CBC
HEMATOCRIT: 29.1 % — AB (ref 36.0–46.0)
HEMOGLOBIN: 10 g/dL — AB (ref 12.0–15.0)
MCH: 29.9 pg (ref 26.0–34.0)
MCHC: 34.4 g/dL (ref 30.0–36.0)
MCV: 87.1 fL (ref 78.0–100.0)
Platelets: 342 10*3/uL (ref 150–400)
RBC: 3.34 MIL/uL — AB (ref 3.87–5.11)
RDW: 14.6 % (ref 11.5–15.5)
WBC: 14.6 10*3/uL — ABNORMAL HIGH (ref 4.0–10.5)

## 2013-07-08 LAB — TRIGLYCERIDES: Triglycerides: 189 mg/dL — ABNORMAL HIGH (ref <150)

## 2013-07-08 MED ORDER — PANTOPRAZOLE SODIUM 40 MG PO TBEC
40.0000 mg | DELAYED_RELEASE_TABLET | Freq: Every day | ORAL | Status: DC
  Administered 2013-07-08 – 2013-07-10 (×3): 40 mg via ORAL
  Filled 2013-07-08 (×4): qty 1

## 2013-07-08 MED ORDER — GADOBENATE DIMEGLUMINE 529 MG/ML IV SOLN
10.0000 mL | Freq: Once | INTRAVENOUS | Status: AC | PRN
  Administered 2013-07-08: 10 mL via INTRAVENOUS

## 2013-07-08 NOTE — Progress Notes (Signed)
Patient ID: Rachael Wall, female   DOB: Jul 03, 1940, 73 y.o.   MRN: [id number] Subjective:  The patient is alert and pleasant. She looks well.  Objective: Vital signs in last 24 hours: Temp:  [97.9 F (36.6 C)-99.7 F (37.6 C)] 98.9 F (37.2 C) (03/17 0700) Pulse Rate:  [71-98] 87 (03/17 0700) Resp:  [15-32] 23 (03/17 0700) BP: (89-173)/(51-78) 148/69 mmHg (03/17 0700) SpO2:  [94 %-100 %] 97 % (03/17 0700) Arterial Line BP: (85-189)/(57-116) 89/81 mmHg (03/17 0500) FiO2 (%):  [30 %-40 %] 30 % (03/17 0534) Weight:  [48.1 kg (106 lb 0.7 oz)] 48.1 kg (106 lb 0.7 oz) (03/17 0338)  Intake/Output from previous day: 03/16 0701 - 03/17 0700 In: 2290.7 [I.V.:2240.7; IV Piggyback:50] Out: 425 [Urine:225; Blood:200] Intake/Output this shift:    Physical exam patient is alert and oriented. She is moving all 4 extremities well. Her dressing is clean and dry.  Lab Results:  Recent Labs  07/07/13 0542 07/08/13 0520  WBC 12.2* 14.6*  HGB 10.3* 10.0*  HCT 30.1* 29.1*  PLT 356 342   BMET  Recent Labs  07/07/13 0542 07/08/13 0520  NA 132* 133*  K 4.1 4.2  CL 94* 98  CO2 24 22  GLUCOSE 116* 105*  BUN 18 19  CREATININE 0.53 0.58  CALCIUM 9.4 8.8    Studies/Results: Portable Chest Xray  07/07/2013   CLINICAL DATA:  Endotracheal tube placement.  EXAM: PORTABLE CHEST - 1 VIEW  COMPARISON:  June 24, 2013.  FINDINGS: Endotracheal tube is in grossly good position with distal tip 4 cm above the carina. Stable cardiomegaly. No pneumothorax or pleural effusion is noted. No acute pulmonary disease is noted. Bony thorax is intact.  IMPRESSION: Endotracheal tube in grossly good position. No acute cardiopulmonary abnormality seen.   Electronically Signed   By: Sabino Dick M.D.   On: 07/07/2013 21:17    Assessment/Plan: The patient is doing well. We will mobilize her. We will get a brain MRI to check on the extent of resection of the tumor. I have Spoken to the patient's daughter.  LOS: 6 days      Ezriel Boffa D 07/08/2013, 8:12 AM

## 2013-07-08 NOTE — Progress Notes (Signed)
Name: Rachael Wall MRN: [id number] DOB: 02-02-1941    ADMISSION DATE:  07/02/2013 SUBJECTIVE:  ADMISSION DATE: 07/02/2013   CONSULTATION DATE: 07/07/13   REFERRING MD : Grandville Silos   PRIMARY SERVICE: [facility]  CHIEF COMPLAINT: Lung Mass   BRIEF PATIENT DESCRIPTION: 73 y.o. F no pertinent PMH presented 3/11 with complaints of worsening SOB. CT chest showed new RLL lung mass with mets to brain, liver, adrenals, lymph nodes. PCCM consulted.   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  3/16: S/p suboccipital Craniectomy for resection of metastatic cerebellar tumor   LINES / TUBES:  oett 3/16>>>3/17 Aline 3/16>>>3/17  CULTURES:  None   ANTIBIOTICS:  Ancef 3/16: per surg X 2 doses   VITAL SIGNS: Temp:  [97.3 F (36.3 C)-99.7 F (37.6 C)] 99.7 F (37.6 C) (03/17 0405) Pulse Rate:  [71-98] 92 (03/17 0518) Resp:  [15-32] 25 (03/17 0518) BP: (89-173)/(51-78) 124/69 mmHg (03/17 0500) SpO2:  [94 %-100 %] 98 % (03/17 0518) Arterial Line BP: (85-189)/(57-116) 89/81 mmHg (03/17 0500) FiO2 (%):  [30 %-40 %] 30 % (03/17 0518) Weight:  [48.1 kg (106 lb 0.7 oz)] 48.1 kg (106 lb 0.7 oz) (03/17 0338) HEMODYNAMICS:   VENTILATOR SETTINGS: Vent Mode:  [-] CPAP;PSV FiO2 (%):  [30 %-40 %] 30 % Set Rate:  [15 bmp] 15 bmp Vt Set:  [350 mL] 350 mL PEEP:  [5 cmH20] 5 cmH20 Pressure Support:  [5 cmH20] 5 cmH20 Plateau Pressure:  [14 cmH20-15 cmH20] 14 cmH20 INTAKE / OUTPUT: Intake/Output     03/16 0701 - 03/17 0700   I.V. (mL/kg) 2140.7 (44.5)   IV Piggyback 50   Total Intake(mL/kg) 2190.7 (45.5)   Urine (mL/kg/hr) 225 (0.2)   Blood 200 (0.2)   Total Output 425   Net +1765.7       Urine Occurrence 1 x   Stool Occurrence  1 x     PHYSICAL EXAMINATION: General: Pleasant female, resting in bed post-op. No distress  Neuro: awake, moves all ext, equal strength  HEENT: [place]/AT. PERRL,Orally intubated, post crani dressing CD&I Cardiovascular: RRR, no M/R/G.  Lungs: Respirations even and unlabored. CTA bilaterally, No W/R/R.  Abdomen: BS x 4, + hepatomegaly, tender in RUQ.  Musculoskeletal: No gross deformities, no edema.  Skin: Intact, warm, no rashes.   LABS:  CBC  Recent Labs Lab 07/04/13 0410 07/06/13 1000 07/07/13 0542  WBC 9.4 12.9* 12.2*  HGB 10.3* 10.8* 10.3*  HCT 30.7* 31.5* 30.1*  PLT 328 362 356   Coag's  Recent Labs Lab 07/02/13 1635 07/03/13 0455 07/07/13 0542  INR 1.07 1.22 1.09   BMET  Recent Labs Lab 07/04/13 0410 07/06/13 1000 07/07/13 0542  NA 134* 131* 132*  K 4.1 3.8 4.1  CL 97 93* 94*  CO2 21 22 24   BUN 15 16 18   CREATININE 0.54 0.53 0.53  GLUCOSE 79 118* 116*   Electrolytes  Recent Labs Lab 07/04/13 0410 07/06/13 1000 07/07/13 0542  CALCIUM 9.6 9.7 9.4   Sepsis Markers No results found for this basename: LATICACIDVEN, PROCALCITON, O2SATVEN,  in the last 168 hours ABG  Recent Labs Lab 07/07/13 2145  PHART 7.329*  PCO2ART 44.0  PO2ART 142.0*   Liver Enzymes  Recent Labs Lab 07/02/13  1541  AST 85*  ALT 27  ALKPHOS 260*  BILITOT 0.6  ALBUMIN 3.7   Cardiac Enzymes No results found for this basename: TROPONINI, PROBNP,  in the last 168 hours Glucose No results found for this basename: GLUCAP,  in the last 168 hours  Imaging Portable Chest Xray  07/07/2013   CLINICAL DATA:  Endotracheal tube placement.  EXAM: PORTABLE CHEST - 1 VIEW  COMPARISON:  June 24, 2013.  FINDINGS: Endotracheal tube is in grossly good position with distal tip 4 cm above the carina. Stable cardiomegaly. No pneumothorax or pleural effusion is noted. No acute pulmonary disease is noted. Bony thorax is intact.  IMPRESSION: Endotracheal tube in grossly good position. No  acute cardiopulmonary abnormality seen.   Electronically Signed   By: Sabino Dick M.D.   On: 07/07/2013 21:17     CXR: no acute findings. ETT good position   ASSESSMENT / PLAN:  PULMONARY A:  Acute respiratory failure. Likely residual anesthesia affect. Only pulled 150 ml Vt on SBT trial on arrival to ICU, now looking good w/ SBT efforts. SBI acceptable  Lung mass (large cell poorly diff neuroendocrine) with extensive adenopathy and mets to liver, adrenals, and brain  P:   Extubate to n/c pulm hygiene  Aspiration precautions Resume further heme/onc plan as outlined per med/onc team   CARDIOVASCULAR A:  HTN: possibly pain induced.  P:  PRN hydralazine   RENAL A:   Hyponatremia: stable P:   Keep even volume status Ck am chemistry   GASTROINTESTINAL A:  Protein cal malnutrition       Liver mets      Dysphagia  P:   Adv diet as tol s/p extubation  Resume protein supplementation post-op    HEMATOLOGIC A:  Lung mass (large cell poorly diff neuroendocrine) with extensive adenopathy and mets to liver, adrenals, and brain      Mild anemia   P:  PAS F/u cbc in am   INFECTIOUS A:  No acute  P:   Trend fever curve   ENDOCRINE A:  Steroid induced hyperglycemia  P:   ssi if glucose >150   NEUROLOGIC A:  S/p suboccipital Craniectomy for resection of metastatic cerebellar tumor  P:   Post-op per neuro-surg   TODAY'S SUMMARY:  Now ready for extubation. Will go ahead and extubate, then cont routine post-op plan per n-surg and heme/onc    I have personally obtained a history, examined the patient, evaluated laboratory and imaging results, formulated the assessment and plan and placed orders.  CRITICAL CARE: The patient is critically ill with multiple organ systems failure and requires high complexity decision making for assessment and support, frequent evaluation and titration of therapies, application of advanced monitoring technologies and extensive interpretation  of multiple databases. Critical Care Time devoted to patient care services described in this note is 40 minutes.   Rush Farmer, M.D. [facility] Pulmonary/Critical Care Medicine. Pager: [phone]. After hours pager: [phone].

## 2013-07-08 NOTE — Procedures (Signed)
Extubation Procedure Note  Patient Details:   Name: Rachael Wall DOB: 1940/10/08 MRN: [id number]   Airway Documentation:     Evaluation  O2 sats: stable throughout Complications: No apparent complications Patient did tolerate procedure well. Bilateral Breath Sounds: Clear;Diminished Suctioning: Oral Yes  Patient's parameters as folls: VC .768 and a NIF of -48. Patient had a positive cuff leak and was extubated to a nasal cannula of 4 L.  Patients vitals are within normal limits. RT will continue to monitor.   Carrington Clamp A 07/08/2013, 5:43 AM

## 2013-07-09 DIAGNOSIS — E46 Unspecified protein-calorie malnutrition: Secondary | ICD-10-CM

## 2013-07-09 NOTE — Evaluation (Signed)
Physical Therapy Evaluation Patient Details Name: Rachael Wall MRN: [id number] DOB: October 06, 1940 Today's Date: 07/09/2013 Time: 2409-7353 PT Time Calculation (min): 31 min  PT Assessment / Plan / Recommendation History of Present Illness  pt s/p Left suboccipital craniectomy for posterior resection of cerebellar tumor using microdissection  Clinical Impression  Pt adm with the above dx. Pt displayed minimal ataxic movements in L UE but otherwise demonstrated UE and LE strength WFL. Pt's activity was limited due to SOB and fatigue. Pt to benefit from HHPT upon d/c as pt has assistance at home (provided by daughter and son in-law) throughout the day. Pt to benefit from skilled acute PT to address functional deficits listed below.      PT Assessment  Patient needs continued PT services    Follow Up Recommendations  Home health PT;Supervision/Assistance - 24 hour    Does the patient have the potential to tolerate intense rehabilitation      Barriers to Discharge        Equipment Recommendations  Rolling walker with 5" wheels;3in1 (PT) (vs shower chair)    Recommendations for Other Services     Frequency Min 3X/week    Precautions / Restrictions Precautions Precautions: Fall Restrictions Weight Bearing Restrictions: No   Pertinent Vitals/Pain Pt's O2 sats dropped to 88% during seated EOB activities. Pt's HR rose to 140s during amb without a RW but returned to safe limits amb with RW.        Mobility  Bed Mobility Overal bed mobility: Needs Assistance Bed Mobility: Supine to Sit Supine to sit: Min guard General bed mobility comments: verbal cues for safety. O2 sat dropped to 88% during EOB MMT. pt was put back on O2 for remaining of tx  Transfers Overall transfer level: Needs assistance Equipment used: Rolling walker (2 wheeled) Transfers: Sit to/from Stand Sit to Stand: Min assist General transfer comment: verbal cues for safety and contact min A for balance upon  standing Ambulation/Gait Ambulation/Gait assistance: Min assist Ambulation Distance (Feet): 125 Feet Assistive device: Rolling walker (2 wheeled) Gait Pattern/deviations: Step-through pattern;Decreased stride length Gait velocity: slow and guarded for falls General Gait Details: pt able to amb without RW but HR rose to 140s so RW was returned for energy conservation. RW increased pt stability. pt preferred to amb with RW    Exercises     PT Diagnosis: Difficulty walking  PT Problem List: Decreased strength;Decreased activity tolerance;Decreased balance;Decreased mobility;Decreased coordination;Decreased knowledge of use of DME;Decreased safety awareness PT Treatment Interventions: DME instruction;Gait training;Stair training;Functional mobility training;Therapeutic activities;Therapeutic exercise;Balance training;Patient/family education     PT Goals(Current goals can be found in the care plan section) Acute Rehab PT Goals Patient Stated Goal: return home PT Goal Formulation: With patient Time For Goal Achievement: 07/16/13 Potential to Achieve Goals: Good  Visit Information  Last PT Received On: 07/09/13 Assistance Needed: +1 History of Present Illness: pt s/p Left suboccipital craniectomy for posterior resection of cerebellar tumor using microdissection       Prior Functioning  Home Living Family/patient expects to be discharged to:: Private residence Living Arrangements: Children Available Help at Discharge: Family;Available 24 hours/day Type of Home: House Home Access: Stairs to enter [employer] of Steps: 5 Entrance Stairs-Rails: Can reach both Home Layout: One level Home Equipment: None Additional Comments: daughter home during the evening. son in law home during the day Prior Function Level of Independence: Independent Comments: hasn't been driving Communication Communication: No difficulties Dominant Hand: Left    Cognition   Cognition Arousal/Alertness: Awake/alert  Behavior During Therapy: WFL for tasks assessed/performed Overall Cognitive Status: Within Functional Limits for tasks assessed    Extremity/Trunk Assessment Upper Extremity Assessment Upper Extremity Assessment: LUE deficits/detail LUE Coordination:  (pt displayed minimal ataxic movement performing FNF test) Lower Extremity Assessment Lower Extremity Assessment: Overall WFL for tasks assessed Cervical / Trunk Assessment Cervical / Trunk Assessment: Normal   Balance Balance Overall balance assessment: Needs assistance Sitting-balance support: Bilateral upper extremity supported Sitting balance-Leahy Scale: Good Sitting balance - Comments: Bilat UE support during LE MMT otherwise pt was able to sit EOB without support Standing balance support: Bilateral upper extremity supported Standing balance-Leahy Scale: Fair Standing balance comment: pt able to march in place with min guard and without RW. RW for energy conservation and for safety support. pt able to stand without RW but prefers RW for balance and stability   End of Session PT - End of Session Equipment Utilized During Treatment: Gait belt;Oxygen (2L of O2 ) Activity Tolerance: Patient tolerated treatment well;Patient limited by fatigue Patient left: in chair;with call bell/phone within reach Nurse Communication: Mobility status  GP     Noele Icenhour SPT 07/09/2013, 12:12 PM

## 2013-07-09 NOTE — Progress Notes (Signed)
Patient ID: Rachael Wall, female   DOB: March 28, 1941, 73 y.o.   MRN: [id number] Subjective:  The patient is alert and pleasant. She is in no apparent distress. She has no complaints.  Objective: Vital signs in last 24 hours: Temp:  [97.3 F (36.3 C)-99.2 F (37.3 C)] 97.3 F (36.3 C) (03/18 0809) Pulse Rate:  [80-97] 80 (03/18 0700) Resp:  [20-33] 21 (03/18 0700) BP: (107-159)/(63-73) 159/67 mmHg (03/18 0700) SpO2:  [93 %-100 %] 93 % (03/18 0809) Weight:  [48.8 kg (107 lb 9.4 oz)] 48.8 kg (107 lb 9.4 oz) (03/18 0500)  Intake/Output from previous day: 03/17 0701 - 03/18 0700 In: 1660 [P.O.:960; I.V.:650; IV Piggyback:50] Out: 985 [Urine:985] Intake/Output this shift:    Physical exam patient is alert and pleasant. He looks well. Her dressing is clean and dry. She is moving all 4 extremities well.  Lab Results:  Recent Labs  07/07/13 0542 07/08/13 0520  WBC 12.2* 14.6*  HGB 10.3* 10.0*  HCT 30.1* 29.1*  PLT 356 342   BMET  Recent Labs  07/07/13 0542 07/08/13 0520  NA 132* 133*  K 4.1 4.2  CL 94* 98  CO2 24 22  GLUCOSE 116* 105*  BUN 18 19  CREATININE 0.53 0.58  CALCIUM 9.4 8.8    Studies/Results: Mr Rachael Wall Contrast  07/08/2013   CLINICAL DATA:  Metastatic lung cancer. Postop day 1 craniotomy and resection of left cerebellar tumor.  EXAM: MRI HEAD WITHOUT AND WITH CONTRAST  TECHNIQUE: Multiplanar, multiecho pulse sequences of the brain and surrounding structures were obtained without and with intravenous contrast.  CONTRAST:  31mL MULTIHANCE GADOBENATE DIMEGLUMINE 529 MG/ML IV SOLN  COMPARISON:  MRI 07/04/2013  FINDINGS: Postop suboccipital craniectomy on the left for left cerebellar tumor resection. Suboccipital fluid collection measures approximately 17 x 31 mm. There is postop subarachnoid hemorrhage. There is a mild amount of hemorrhage seen throughout the subarachnoid space especially the sylvian fissures and interhemispheric fissure. No subdural hematoma. There  is also some blood in the surgical cavity left cerebellum .  There remains a moderate amount of left cerebellar edema although it is improved from the preoperative MRI. There is improvement in mass effect on the fourth ventricle since the preop study. Following contrast administration, there is mild thin irregular enhancement of the surgical cavity in the left cerebellum. Since this is a postop day 1 MRI, this is suspicious for tumor in the wall of the surgical cavity. Close followup is warranted.  No additional enhancing metastatic deposits are present in the brain.  Generalized atrophy is present. 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 Cerebellar tonsils remain displaced below the foramina magnum by approximately 3 mm, improved since the prior study following resection of tumor.  IMPRESSION: Postop resection of left cerebellar tumor. Mild subarachnoid hemorrhage is present. 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  There is mild enhancement of the wall of the surgical cavity in left cerebellum. Given that this is a postop day 1 scan, this is suspicious for tumor in the surgical cavity. Close followup is warranted.   Electronically Signed   By: Franchot Gallo M.D.   On: 07/08/2013 13:35   Portable Chest Xray  07/07/2013   CLINICAL DATA:  Endotracheal tube placement.  EXAM: PORTABLE CHEST - 1 VIEW  COMPARISON:  June 24, 2013.  FINDINGS: Endotracheal tube is in grossly good position with distal tip 4 cm above the carina. Stable cardiomegaly. No pneumothorax or pleural  effusion is noted. No acute pulmonary disease is noted. Bony thorax is intact.  IMPRESSION: Endotracheal tube in grossly good position. No acute cardiopulmonary abnormality seen.   Electronically Signed   By: Sabino Dick M.D.   On: 07/07/2013 21:17    Assessment/Plan: Postop day #2: Patient's postoperative MRI scan looks good. She is doing well clinically. We  will transfer to the floor. We will likely transfer back to [facility] and to the oncology service tomorrow. I've answered all the patient's, and her daughter's, questions.  LOS: 7 days     Rachael Wall D 07/09/2013, 11:39 AM

## 2013-07-09 NOTE — Evaluation (Deleted)
Physical Therapy Evaluation Patient Details Name: Rachael Wall MRN: [id number] DOB: 07/03/1940 Today's Date: 07/09/2013 Time: 0932-3557 PT Time Calculation (min): 31 min  PT Assessment / Plan / Recommendation History of Present Illness  pt s/p Left suboccipital craniectomy for posterior resection of cerebellar tumor using microdissection  Clinical Impression  Pt adm with the above dx. Pt displayed minimal ataxic movements in L UE but otherwise demonstrated UE and LE strength WFL. Pt's activity was limited due to SOB and fatigue. Pt to benefit from HHPT upon d/c as pt has assistance at home (provided by daughter and son in-law) throughout the day. Pt to benefit from skilled acute PT to address functional deficits listed below.     PT Assessment  Patient needs continued PT services    Follow Up Recommendations  Home health PT    Does the patient have the potential to tolerate intense rehabilitation      Barriers to Discharge        Equipment Recommendations  Rolling walker with 5" wheels;3in1 (PT)    Recommendations for Other Services     Frequency Min 3X/week    Precautions / Restrictions Precautions Precautions: Fall Restrictions Weight Bearing Restrictions: No   Pertinent Vitals/Pain Pt's O2 sats dropped to 88% during seated EOB activities. Pt's HR rose to 140s during amb without a RW but returned to safe limits amb with RW.       Mobility  Bed Mobility Overal bed mobility: Needs Assistance Bed Mobility: Supine to Sit Supine to sit: Min guard General bed mobility comments: verbal cues for safety. O2 sat dropped to 88% during EOB MMT. pt was put back on O2 for remaining of tx  Transfers Overall transfer level: Needs assistance Equipment used: Rolling walker (2 wheeled) Transfers: Sit to/from Stand Sit to Stand: Min assist General transfer comment: verbal cues for safety and contact min A for balance upon standing Ambulation/Gait Ambulation/Gait assistance: Min  assist Ambulation Distance (Feet): 125 Feet Assistive device: Rolling walker (2 wheeled) Gait Pattern/deviations: Step-through pattern;Decreased stride length Gait velocity: slow and guarded for falls General Gait Details: pt able to amb without RW but HR rose to 140s so RW was returned for energy conservation    Exercises     PT Diagnosis: Difficulty walking  PT Problem List: Decreased strength;Decreased activity tolerance;Decreased balance;Decreased mobility;Decreased coordination;Decreased knowledge of use of DME;Decreased safety awareness PT Treatment Interventions: DME instruction;Gait training;Stair training;Functional mobility training;Therapeutic activities;Therapeutic exercise;Balance training;Patient/family education     PT Goals(Current goals can be found in the care plan section) Acute Rehab PT Goals Patient Stated Goal: return home PT Goal Formulation: With patient Time For Goal Achievement: 07/16/13 Potential to Achieve Goals: Good  Visit Information  Last PT Received On: 07/09/13 Assistance Needed: +1 History of Present Illness: pt s/p Left suboccipital craniectomy for posterior resection of cerebellar tumor using microdissection       Prior Functioning  Home Living Family/patient expects to be discharged to:: Private residence Living Arrangements: Children Available Help at Discharge: Family;Available 24 hours/day Type of Home: House Home Access: Stairs to enter [employer] of Steps: 5 Entrance Stairs-Rails: Can reach both Home Layout: One level Home Equipment: None Additional Comments: daughter home during the evening. son in law home during the day Prior Function Level of Independence: Independent Comments: hasn't been driving Communication Communication: No difficulties Dominant Hand: Left    Cognition  Cognition Arousal/Alertness: Awake/alert Behavior During Therapy: WFL for tasks assessed/performed Overall Cognitive Status: Within  Functional Limits for tasks assessed  Extremity/Trunk Assessment Upper Extremity Assessment Upper Extremity Assessment: Overall WFL for tasks assessed Lower Extremity Assessment Lower Extremity Assessment: Overall WFL for tasks assessed Cervical / Trunk Assessment Cervical / Trunk Assessment: Normal   Balance Balance Overall balance assessment: Needs assistance Sitting-balance support: Bilateral upper extremity supported Sitting balance-Leahy Scale: Good Sitting balance - Comments: Bilat UE support during LE MMT otherwise pt was able to sit EOB without support Standing balance support: Bilateral upper extremity supported Standing balance-Leahy Scale: Fair Standing balance comment: pt able to march in place with min guard and without RW. RW for energy conservation and for safety support. pt able to stand without RW but prefers RW for balance and supoort on this date General Comments General comments (skin integrity, edema, etc.): pt showed minimal signs of ataxic movement on the L UE but WFL. pt's O2 dropped to 88 during sitting activities and was put back on O2. pt's HR rose to 140s when ambulating without RW but quickly returned to normal safe HR when amb with RW.   End of Session PT - End of Session Equipment Utilized During Treatment: Gait belt;Oxygen (2L of O2 ) Activity Tolerance: Patient tolerated treatment well;Patient limited by fatigue Patient left: in chair;with call bell/phone within reach Nurse Communication: Mobility status  GP     Marque Bango SPT 07/09/2013, 8:28 AM

## 2013-07-09 NOTE — Evaluation (Signed)
Physical Therapy Evaluation Patient Details Name: Rachael Wall MRN: [id number] DOB: 10-10-1940 Today's Date: 07/09/2013 Time: 0973-5329 PT Time Calculation (min): 31 min  PT Assessment / Plan / Recommendation History of Present Illness  pt s/p Left suboccipital craniectomy for posterior resection of cerebellar tumor using microdissection  Clinical Impression  Pt adm with the above dx. Pt displayed minimal ataxic movements in L UE but otherwise demonstrated UE and LE strength WFL. Pt's activity was limited due to SOB and fatigue. Pt to benefit from HHPT upon d/c as pt has assistance at home (provided by daughter and son in-law) throughout the day. Pt to benefit from skilled acute PT to address functional deficits listed below.      PT Assessment  Patient needs continued PT services    Follow Up Recommendations  Home health PT;Supervision/Assistance - 24 hour    Does the patient have the potential to tolerate intense rehabilitation      Barriers to Discharge        Equipment Recommendations  Rolling walker with 5" wheels;3in1 (PT) (vs shower chair)    Recommendations for Other Services     Frequency Min 3X/week    Precautions / Restrictions Precautions Precautions: Fall Restrictions Weight Bearing Restrictions: No   Pertinent Vitals/Pain Pt's O2 sats dropped to 88% during seated EOB activities. Pt's HR rose to 140s during amb without a RW but returned to safe limits amb with RW.        Mobility  Bed Mobility Overal bed mobility: Needs Assistance Bed Mobility: Supine to Sit Supine to sit: Min guard General bed mobility comments: verbal cues for safety. O2 sat dropped to 88% during EOB MMT. pt was put back on O2 for remaining of tx  Transfers Overall transfer level: Needs assistance Equipment used: Rolling walker (2 wheeled) Transfers: Sit to/from Stand Sit to Stand: Min assist General transfer comment: verbal cues for safety and contact min A for balance upon  standing Ambulation/Gait Ambulation/Gait assistance: Min assist Ambulation Distance (Feet): 125 Feet Assistive device: Rolling walker (2 wheeled) Gait Pattern/deviations: Step-through pattern;Decreased stride length Gait velocity: slow and guarded for falls General Gait Details: pt able to amb without RW but HR rose to 140s so RW was returned for energy conservation. RW increased pt stability. pt preferred to amb with RW    Exercises     PT Diagnosis: Difficulty walking  PT Problem List: Decreased strength;Decreased activity tolerance;Decreased balance;Decreased mobility;Decreased coordination;Decreased knowledge of use of DME;Decreased safety awareness PT Treatment Interventions: DME instruction;Gait training;Stair training;Functional mobility training;Therapeutic activities;Therapeutic exercise;Balance training;Patient/family education     PT Goals(Current goals can be found in the care plan section) Acute Rehab PT Goals Patient Stated Goal: return home PT Goal Formulation: With patient Time For Goal Achievement: 07/16/13 Potential to Achieve Goals: Good  Visit Information  Last PT Received On: 07/09/13 Assistance Needed: +1 History of Present Illness: pt s/p Left suboccipital craniectomy for posterior resection of cerebellar tumor using microdissection       Prior Functioning  Home Living Family/patient expects to be discharged to:: Private residence Living Arrangements: Children Available Help at Discharge: Family;Available 24 hours/day Type of Home: House Home Access: Stairs to enter [employer] of Steps: 5 Entrance Stairs-Rails: Can reach both Home Layout: One level Home Equipment: None Additional Comments: daughter home during the evening. son in law home during the day Prior Function Level of Independence: Independent Comments: hasn't been driving Communication Communication: No difficulties Dominant Hand: Left    Cognition   Cognition Arousal/Alertness: Awake/alert  Behavior During Therapy: WFL for tasks assessed/performed Overall Cognitive Status: Within Functional Limits for tasks assessed    Extremity/Trunk Assessment Upper Extremity Assessment Upper Extremity Assessment: LUE deficits/detail LUE Coordination:  (pt displayed minimal ataxic movement performing FNF test) Lower Extremity Assessment Lower Extremity Assessment: Overall WFL for tasks assessed Cervical / Trunk Assessment Cervical / Trunk Assessment: Normal   Balance Balance Overall balance assessment: Needs assistance Sitting-balance support: Bilateral upper extremity supported Sitting balance-Leahy Scale: Good Sitting balance - Comments: Bilat UE support during LE MMT otherwise pt was able to sit EOB without support Standing balance support: Bilateral upper extremity supported Standing balance-Leahy Scale: Fair Standing balance comment: pt able to march in place with min guard and without RW. RW for energy conservation and for safety support. pt able to stand without RW but prefers RW for balance and stability   End of Session PT - End of Session Equipment Utilized During Treatment: Gait belt;Oxygen (2L of O2 ) Activity Tolerance: Patient tolerated treatment well;Patient limited by fatigue Patient left: in chair;with call bell/phone within reach Nurse Communication: Mobility status  GP     Kohler,Andrew SPT 07/09/2013, 12:12 PM  Agree with above assessment.  Kittie Plater, PT, DPT Pager #: [phone] Office #: [phone]

## 2013-07-09 NOTE — Progress Notes (Signed)
INITIAL NUTRITION ASSESSMENT  DOCUMENTATION CODES Per approved criteria  -Severe malnutrition in the context of chronic illness  Pt meets criteria for severe MALNUTRITION in the context of chronic illness as evidenced by 24% weight loss in 3 months, PO intake <75% for > one month.   INTERVENTION: Continue Boost Plus TID D/C Beneprotein TID Magic Cup TID Encouraged PO intake   NUTRITION DIAGNOSIS: Inadequate oral intake related to decreased appetite/coughing as evidenced by PO intake <75%, 30 lbs wt loss;ongoing.   Goal: Pt to meet >/= 90% of their estimated nutrition needs, not met.   Monitor:  Meal completion, supplement acceptance, weight trend  ASSESSMENT: 73 yo female overall healthy comes in with over a month of sob really worse in the last couple of days. She has lost a lot of weight in last couple of months, not eating well due to lack of appetite and when she eats it makes her sob worse.  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 Pt is s/p suboccipital craniectomy for resection of metastatic cerebellar tumor 3/16.   Pt extubated and now on oral diet. Pt reports ongoing poor appetite eating very little today. Pt has Boost Plus and beneprotein ordered. Pt has tried Prostat and does not like this supplement. Reviewed importance of nutrition with pt, discussed nutrition at discharge. Contact information provided.   Height: Ht Readings from Last 1 Encounters:  07/05/13 _0  (1.499 m)    Weight: Wt Readings from Last 1 Encounters:  07/09/13 107 lb 9.4 oz (48.8 kg)  Admission weight 98 lb (42.6 kg)  BMI:  Body mass index is 21.72 kg/(m^2).  Estimated Nutritional Needs: Kcal: 1350-1550 Protein: 65-75 gram Fluid: >/=1500 ml/daily  Skin: back incision  Diet Order: General Meal Completion: 25%   Intake/Output Summary (Last 24 hours) at 07/09/13 1408 Last data filed at 07/08/13 2000  Gross per 24 hour   Intake    780 ml  Output    435 ml  Net    345 ml    Last BM: 3/17  Labs:   Recent Labs Lab 07/06/13 1000 07/07/13 0542 07/08/13 0520  NA 131* 132* 133*  K 3.8 4.1 4.2  CL 93* 94* 98  CO2 _1 BUN _2 CREATININE 0.53 0.53 0.58  CALCIUM 9.7 9.4 8.8  GLUCOSE 118* 116* 105*    CBG (last 3)  No results found for this basename: GLUCAP,  in the last 72 hours  Scheduled Meds: . budesonide-formoterol  2 puff Inhalation BID  . dexamethasone  4 mg Intravenous 4 times per day   Followed by  . [START ON 07/10/2013] dexamethasone  4 mg Intravenous 3 times per day  . lactose free nutrition  237 mL Oral TID BM  . pantoprazole  40 mg Oral QHS  . protein supplement  1 scoop Oral TID WC  . sodium chloride  3 mL Intravenous Q12H    Continuous Infusions: . sodium chloride Stopped (07/08/13 2000)    [place], [place], [place] Pager [phone] After Hours Pager

## 2013-07-09 NOTE — Progress Notes (Signed)
  Radiation Oncology         [phone] ________________________________  Name: Symia Herdt  MRN: [id number]  Date: 07/02/2013  DOB: 26-Jul-1940  Chart Note:  I reviewed this patient's most recent notes and findings.  She appears to be progressing well post-operatively, extubated, and hemodynamically stable.  She remains at high risk for SVC syndrome and airway symptoms and would likely benefit from palliative radiotherapy to the mediastinum.  If she continues to progress with recovery, I would suggest transfer back [facility] for possible CT simulation chest on Friday morning.  If she remains stable, she could potentially be subsequently treated as an outpatient.  Depending on her tolerance and response to radiotherapy, cranial radiotherapy may also be warranted to reduce her risk of recurrent brain metastasis.  ________________________________  Sheral Apley Tammi Klippel, M.D.

## 2013-07-09 NOTE — Progress Notes (Signed)
Name: Rachael Wall MRN: [id number] DOB: [birth year]    ADMISSION DATE:  07/02/2013 SUBJECTIVE:  ADMISSION DATE: 07/02/2013   CONSULTATION DATE: 07/07/13   REFERRING MD : Grandville Silos   PRIMARY SERVICE: Family Med  CHIEF COMPLAINT: Lung Mass   BRIEF PATIENT DESCRIPTION: 73 y.o. F no pertinent PMH presented 3/11 with complaints of worsening SOB. CT chest showed new RLL lung mass with mets to brain, liver, adrenals, lymph nodes. PCCM consulted.   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  3/16: S/p suboccipital Craniectomy for resection of metastatic cerebellar tumor   LINES / TUBES:  oett 3/16>>>3/17 Aline 3/16>>>3/17  CULTURES:  None   ANTIBIOTICS:  Ancef 3/16: per surg X 2 doses   VITAL SIGNS: Temp:  [97.3 F (36.3 C)-99.2 F (37.3 C)] 97.3 F (36.3 C) (03/18 0809) Pulse Rate:  [80-97] 80 (03/18 0700) Resp:  [20-33] 21 (03/18 0700) BP: (107-159)/(63-73) 159/67 mmHg (03/18 0700) SpO2:  [93 %-100 %] 93 % (03/18 0809) Weight:  [107 lb 9.4 oz (48.8 kg)] 107 lb 9.4 oz (48.8 kg) (03/18 0500) HEMODYNAMICS:   VENTILATOR SETTINGS:   INTAKE / OUTPUT: Intake/Output     03/17 0701 - 03/18 0700 03/18 0701 - 03/19 0700   P.O. 960    I.V. (mL/kg) 650 (13.3)    IV Piggyback 50    Total Intake(mL/kg) 1660 (34)    Urine (mL/kg/hr) 985 (0.8)    Blood     Total Output 985     Net +675          Urine Occurrence 4 x 1 x   Stool Occurrence 1 x     PHYSICAL EXAMINATION: General: Pleasant female, resting in bed post-op. No distress  Neuro: awake, moves all ext, equal strength  HEENT: [place]/AT. PERRL,Orally intubated, post crani dressing CD&I Cardiovascular: RRR, no M/R/G.  Lungs: Respirations even and unlabored. CTA  bilaterally, No W/R/R.  Abdomen: BS x 4, + hepatomegaly, tender in RUQ.  Musculoskeletal: No gross deformities, no edema.  Skin: Intact, warm, no rashes.  LABS:  CBC  Recent Labs Lab 07/06/13 1000 07/07/13 0542 07/08/13 0520  WBC 12.9* 12.2* 14.6*  HGB 10.8* 10.3* 10.0*  HCT 31.5* 30.1* 29.1*  PLT 362 356 342   Coag's  Recent Labs Lab 07/02/13 1635 07/03/13 0455 07/07/13 0542  INR 1.07 1.22 1.09   BMET  Recent Labs Lab 07/06/13 1000 07/07/13 0542 07/08/13 0520  NA 131* 132* 133*  K 3.8 4.1 4.2  CL 93* 94* 98  CO2 22 24 22   BUN 16 18 19   CREATININE 0.53 0.53 0.58  GLUCOSE 118* 116* 105*   Electrolytes  Recent Labs Lab 07/06/13 1000 07/07/13 0542 07/08/13 0520  CALCIUM 9.7 9.4 8.8   Sepsis Markers No results found for this basename: LATICACIDVEN, PROCALCITON, O2SATVEN,  in the last 168 hours ABG  Recent Labs Lab 07/07/13 2145  PHART 7.329*  PCO2ART 44.0  PO2ART 142.0*   Liver Enzymes  Recent Labs Lab 07/02/13 1541  AST 85*  ALT 27  ALKPHOS 260*  BILITOT 0.6  ALBUMIN 3.7   Cardiac Enzymes No results found for this basename: TROPONINI, PROBNP,  in the last 168 hours Glucose No results found for this basename: GLUCAP,  in  the last 168 hours  Imaging Mr Jeri Cos GB Contrast  07/08/2013   CLINICAL DATA:  Metastatic lung cancer. Postop day 1 craniotomy and resection of left cerebellar tumor.  EXAM: MRI HEAD WITHOUT AND WITH CONTRAST  TECHNIQUE: Multiplanar, multiecho pulse sequences of the brain and surrounding structures were obtained without and with intravenous contrast.  CONTRAST:  66mL MULTIHANCE GADOBENATE DIMEGLUMINE 529 MG/ML IV SOLN  COMPARISON:  MRI 07/04/2013  FINDINGS: Postop suboccipital craniectomy on the left for left cerebellar tumor resection. Suboccipital fluid collection measures approximately 17 x 31 mm. There is postop subarachnoid hemorrhage. There is a mild amount of hemorrhage seen throughout the subarachnoid space  especially the sylvian fissures and interhemispheric fissure. No subdural hematoma. There is also some blood in the surgical cavity left cerebellum .  There remains a moderate amount of left cerebellar edema although it is improved from the preoperative MRI. There is improvement in mass effect on the fourth ventricle since the preop study. Following contrast administration, there is mild thin irregular enhancement of the surgical cavity in the left cerebellum. Since this is a postop day 1 MRI, this is suspicious for tumor in the wall of the surgical cavity. Close followup is warranted.  No additional enhancing metastatic deposits are present in the brain.  Generalized atrophy is present. 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 Cerebellar tonsils remain displaced below the foramina magnum by approximately 3 mm, improved since the prior study following resection of tumor.  IMPRESSION: Postop resection of left cerebellar tumor. Mild subarachnoid hemorrhage is present. 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  There is mild enhancement of the wall of the surgical cavity in left cerebellum. Given that this is a postop day 1 scan, this is suspicious for tumor in the surgical cavity. Close followup is warranted.   Electronically Signed   By: Franchot Gallo M.D.   On: 07/08/2013 13:35   Portable Chest Xray  07/07/2013   CLINICAL DATA:  Endotracheal tube placement.  EXAM: PORTABLE CHEST - 1 VIEW  COMPARISON:  June 24, 2013.  FINDINGS: Endotracheal tube is in grossly good position with distal tip 4 cm above the carina. Stable cardiomegaly. No pneumothorax or pleural effusion is noted. No acute pulmonary disease is noted. Bony thorax is intact.  IMPRESSION: Endotracheal tube in grossly good position. No acute cardiopulmonary abnormality seen.   Electronically Signed   By: Sabino Dick M.D.   On: 07/07/2013 21:17     CXR: no acute  findings. ETT good position   ASSESSMENT / PLAN:  PULMONARY A:  Acute respiratory failure. Likely residual anesthesia affect. Only pulled 150 ml Vt on SBT trial on arrival to ICU, now looking good w/ SBT efforts. 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 - IS and flutter valve. - PT/OT. - OOB to chair. - Resume further heme/onc plan as outlined per med/onc team.  CARDIOVASCULAR A:  HTN: possibly pain induced.  P:  - PRN hydralazine.   RENAL A:   Hyponatremia: stable P:   - Recommend KVO IVF at this point, taking PO.  GASTROINTESTINAL A:  Protein cal malnutrition       Liver mets      Dysphagia  P:   - Advance diet to regular as tolerated. - Resume protein supplementation post-op.  HEMATOLOGIC A:  Lung  mass (large cell poorly diff neuroendocrine) with extensive adenopathy and mets to liver, adrenals, and brain      Mild anemia  P:  - PAS. - Will defer pharmacologic DVT prophylaxis to when NS feels patient is ready. - F/u cbc in am.  INFECTIOUS A:  No acute  P:   - Trend fever curve. - WBC slightly increasing but on steroids.  ENDOCRINE A:  Steroid induced hyperglycemia  P:   - SSI if glucose >150, steroids per NS.  NEUROLOGIC A:  S/p suboccipital Craniectomy for resection of metastatic cerebellar tumor  P:   - Post-op per neuro-surg.  TODAY'S SUMMARY:  Extubated and doing well, advance diet, not abx, steroids and heparin per NS.  Ok to transfer out of the ICU.  PCCM will sign off, please call back if needed.  I have personally obtained a history, examined the patient, evaluated laboratory and imaging results, formulated the assessment and plan and placed orders.  Rush Farmer, M.D. [facility] Pulmonary/Critical Care Medicine. Pager: [phone]. After hours pager: [phone].

## 2013-07-10 ENCOUNTER — Telehealth: Payer: Self-pay | Admitting: *Deleted

## 2013-07-10 LAB — BASIC METABOLIC PANEL
BUN: 15 mg/dL (ref 6–23)
CALCIUM: 8.9 mg/dL (ref 8.4–10.5)
CO2: 25 mEq/L (ref 19–32)
CREATININE: 0.5 mg/dL (ref 0.50–1.10)
Chloride: 93 mEq/L — ABNORMAL LOW (ref 96–112)
GFR calc Af Amer: >90 mL/min (ref >90)
GLUCOSE: 133 mg/dL — AB (ref 70–99)
Potassium: 4.2 mEq/L (ref 3.7–5.3)
SODIUM: 129 meq/L — AB (ref 137–147)

## 2013-07-10 LAB — CBC
HEMATOCRIT: 29.3 % — AB (ref 36.0–46.0)
Hemoglobin: 10.1 g/dL — ABNORMAL LOW (ref 12.0–15.0)
MCH: 29.8 pg (ref 26.0–34.0)
MCHC: 34.5 g/dL (ref 30.0–36.0)
MCV: 86.4 fL (ref 78.0–100.0)
Platelets: 289 10*3/uL (ref 150–400)
RBC: 3.39 MIL/uL — ABNORMAL LOW (ref 3.87–5.11)
RDW: 14.5 % (ref 11.5–15.5)
WBC: 13.3 10*3/uL — ABNORMAL HIGH (ref 4.0–10.5)

## 2013-07-10 LAB — MAGNESIUM: Magnesium: 1.8 mg/dL (ref 1.5–2.5)

## 2013-07-10 LAB — PHOSPHORUS: Phosphorus: 2.1 mg/dL — ABNORMAL LOW (ref 2.3–4.6)

## 2013-07-10 MED ORDER — DEXAMETHASONE 4 MG PO TABS
4.0000 mg | ORAL_TABLET | Freq: Three times a day (TID) | ORAL | Status: DC
  Administered 2013-07-10 – 2013-07-11 (×3): 4 mg via ORAL
  Filled 2013-07-10 (×5): qty 1

## 2013-07-10 NOTE — Progress Notes (Signed)
  Physical Therapy Treatment Patient Details Name: Rachael Wall MRN: [id number] DOB: [birth year] Today's Date: 07/10/2013 Time: 1660-6301 PT Time Calculation (min): 17 min  PT Assessment / Plan / Recommendation  History of Present Illness pt s/p Left suboccipital craniectomy for posterior resection of cerebellar tumor using microdissection   PT Comments   Pt continues to progress with therapy. Pt still limited by fatigue but is very motivated to progress with therapy and increase strength. Will cont to follow per POC.  Follow Up Recommendations  Home health PT;Supervision/Assistance - 24 hour     Does the patient have the potential to tolerate intense rehabilitation     Barriers to Discharge        Equipment Recommendations  Rolling walker with 5" wheels;3in1 (PT)    Recommendations for Other Services    Frequency Min 3X/week   Progress towards PT Goals Progress towards PT goals: Progressing toward goals  Plan Current plan remains appropriate    Precautions / Restrictions Precautions Precautions: Fall Restrictions Weight Bearing Restrictions: No   Pertinent Vitals/Pain No c/o pain.    Mobility  Bed Mobility General bed mobility comments: not assessed; pt sitting in chair and returned to chair Transfers Overall transfer level: Needs assistance Equipment used: Rolling walker (2 wheeled) Transfers: Sit to/from Stand Sit to Stand: Supervision General transfer comment: supervision for VCs for safety  Ambulation/Gait Ambulation/Gait assistance: Min guard Ambulation Distance (Feet): 125 Feet Assistive device: Rolling walker (2 wheeled) Gait Pattern/deviations: Narrow base of support;Decreased stride length Gait velocity: decreased; guarded  Gait velocity interpretation: Below normal speed for age/gender General Gait Details: pt has difficulty at times managing RW around obstacles; cues for sequencing and safety; pt continues to feel more comfortable with RW     Exercises  General Exercises - Lower Extremity Ankle Circles/Pumps: AROM;Both;10 reps;Seated Long Arc Quad: AROM;Both;Strengthening;10 reps;Seated Hip ABduction/ADduction: AROM;Both;Strengthening;10 reps;Seated Hip Flexion/Marching: AROM;Both;10 reps;Seated   PT Diagnosis:    PT Problem List:   PT Treatment Interventions:     PT Goals (current goals can now be found in the care plan section) Acute Rehab PT Goals Patient Stated Goal: return home PT Goal Formulation: With patient Time For Goal Achievement: 07/16/13 Potential to Achieve Goals: Good  Visit Information  Last PT Received On: 07/10/13 Assistance Needed: +1 History of Present Illness: pt s/p Left suboccipital craniectomy for posterior resection of cerebellar tumor using microdissection    Subjective Data  Subjective: "i hope to transfer to [place] today"  Patient Stated Goal: return home   Cognition  Cognition Arousal/Alertness: Awake/alert Behavior During Therapy: WFL for tasks assessed/performed Overall Cognitive Status: Within Functional Limits for tasks assessed    Balance  Balance Overall balance assessment: Needs assistance Standing balance support: During functional activity;Bilateral upper extremity supported Standing balance-Leahy Scale: Poor Standing balance comment: requries UE support to feel "Stable"  High level balance activites: Direction changes;Head turns High Level Balance Comments: no LOB noted or dizziness  End of Session PT - End of Session Equipment Utilized During Treatment: Gait belt Activity Tolerance: Patient tolerated treatment well Patient left: in chair;with call bell/phone within reach;with family/visitor present Nurse Communication: Mobility status   GP     Rachael Wall, [place] 07/10/2013, 12:36 PM

## 2013-07-10 NOTE — Progress Notes (Signed)
UR completed.  Kriya Westra, RN BSN M[facility] CCM Trauma/Neuro ICU Case Manager [phone]  

## 2013-07-10 NOTE — Telephone Encounter (Signed)
Spoke w/Debbie, RN for pt in [facility], Neuro ICU re: pt scheduled for ct sim tomorrow @ 9:00 am. She states pt is to be transferred to [facility] when order placed. She states Dr Arnoldo Morale will transfer pt, but there is no order at this time. Informed her pt is scheduled for ct sim planning session tomorrow at 9:00 am,  and if pt is not at [facility] she will need to call [facility] and have pt transported here for ct sim. Jackelyn Poling states she will contact Dr Arnoldo Morale. Nicanor Bake Dr Johny Shears pager # per her request in the event Dr Arnoldo Morale needs to contact Dr Tammi Klippel. Gave Debbie direct line to this nurses' station to keep this office informed of pt's status.

## 2013-07-10 NOTE — Progress Notes (Signed)
  Radiation Oncology         [phone] ________________________________  Name: Rachael Wall  MRN: [id number]  Date: 07/02/2013  DOB: 03-21-1941  Chart Note:  I reviewed this patient's most recent notes and results.  She has been recovering from surgery and stable for discharge from the ICU.  She remains at imminent risk for SVC syndrome and airway obstruction.  Accordingly, she has been transferred to [facility] with tentative plan to simulate chest radiation tomorrow morning and initiate thoracic radiotherapy for palliation 1 week post craniectomy, or sooner if SVC symptoms develop.  ________________________________  Sheral Apley Tammi Klippel, M.D.

## 2013-07-10 NOTE — Progress Notes (Signed)
Discussed case with Dr. Arnoldo Morale  Pt with a hx of metastatic bran ca who presented initially to [facility] with sob with metastatic lung cancer with brain mets and hydrocephalus on imaging. Neurosurgery was consulted and the patient subsequently underwent craniectomy with resection of tumor on 3/16. Rad onc was consulted with plans for transfer to [facility] for continuation of tx. Pt has been accepted in transfer to [facility], med-surg bed.

## 2013-07-10 NOTE — Progress Notes (Signed)
Patient ID: Rachael Wall, female   DOB: [birth year], 73 y.o.   MRN: [id number] Subjective:  The patient is alert and pleasant. She wants to go back to [place] [facility] and start her treatments.  Objective: Vital signs in last 24 hours: Temp:  [97.3 F (36.3 C)-98.8 F (37.1 C)] 97.3 F (36.3 C) (03/19 0800) Pulse Rate:  [88-100] 96 (03/19 0800) Resp:  [20-32] 20 (03/19 0800) BP: (118-165)/(63-97) 130/97 mmHg (03/19 0922) SpO2:  [90 %-98 %] 91 % (03/19 0800)  Intake/Output from previous day: 03/18 0701 - 03/19 0700 In: 1370 [P.O.:1370] Out: -  Intake/Output this shift: Total I/O In: 240 [P.O.:240] Out: -   Physical exam patient is alert and oriented. She looks well. Her dressing is clean and dry. Her wound is healing well without signs of infection or discharge.  Lab Results:  Recent Labs  07/08/13 0520 07/10/13 0245  WBC 14.6* 13.3*  HGB 10.0* 10.1*  HCT 29.1* 29.3*  PLT 342 289   BMET  Recent Labs  07/08/13 0520 07/10/13 0245  NA 133* 129*  K 4.2 4.2  CL 98 93*  CO2 22 25  GLUCOSE 105* 133*  BUN 19 15  CREATININE 0.58 0.50  CALCIUM 8.8 8.9    Studies/Results: Mr Kizzie Fantasia Contrast  07/08/2013   CLINICAL DATA:  Metastatic lung cancer. Postop day 1 craniotomy and resection of left cerebellar tumor.  EXAM: MRI HEAD WITHOUT AND WITH CONTRAST  TECHNIQUE: Multiplanar, multiecho pulse sequences of the brain and surrounding structures were obtained without and with intravenous contrast.  CONTRAST:  62mL MULTIHANCE GADOBENATE DIMEGLUMINE 529 MG/ML IV SOLN  COMPARISON:  MRI 07/04/2013  FINDINGS: Postop suboccipital craniectomy on the left for left cerebellar tumor resection. Suboccipital fluid collection measures approximately 17 x 31 mm. There is postop subarachnoid hemorrhage. There is a mild amount of hemorrhage seen throughout the subarachnoid space especially the sylvian fissures and interhemispheric fissure. No subdural hematoma. There is also some blood in the surgical  cavity left cerebellum .  There remains a moderate amount of left cerebellar edema although it is improved from the preoperative MRI. There is improvement in mass effect on the fourth ventricle since the preop study. Following contrast administration, there is mild thin irregular enhancement of the surgical cavity in the left cerebellum. Since this is a postop day 1 MRI, this is suspicious for tumor in the wall of the surgical cavity. Close followup is warranted.  No additional enhancing metastatic deposits are present in the brain.  Generalized atrophy is present. 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 Cerebellar tonsils remain displaced below the foramina magnum by approximately 3 mm, improved since the prior study following resection of tumor.  IMPRESSION: Postop resection of left cerebellar tumor. Mild subarachnoid hemorrhage is present. 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  There is mild enhancement of the wall of the surgical cavity in left cerebellum. Given that this is a postop day 1 scan, this is suspicious for tumor in the surgical cavity. Close followup is warranted.   Electronically Signed   By: Franchot Gallo M.D.   On: 07/08/2013 13:35    Assessment/Plan: Postop day #3: The patient is doing very well. She is ready for transfer back to [facility] for further treatment for her metastatic cancer. I'll get in touch with the hospitalist for a transfer.  LOS: 8 days     Lachrisha Ziebarth D 07/10/2013, 11:33 AM

## 2013-07-11 ENCOUNTER — Ambulatory Visit
Admit: 2013-07-11 | Discharge: 2013-07-11 | Disposition: A | Discharge status: Home / Self Care | Payer: Medicare HMO | Attending: Radiation Oncology | Admitting: Radiation Oncology

## 2013-07-11 DIAGNOSIS — R0602 Shortness of breath: Secondary | ICD-10-CM

## 2013-07-11 DIAGNOSIS — C7A090 Malignant carcinoid tumor of the bronchus and lung: Secondary | ICD-10-CM | POA: Insufficient documentation

## 2013-07-11 DIAGNOSIS — C7B8 Other secondary neuroendocrine tumors: Secondary | ICD-10-CM | POA: Insufficient documentation

## 2013-07-11 DIAGNOSIS — C7800 Secondary malignant neoplasm of unspecified lung: Secondary | ICD-10-CM

## 2013-07-11 DIAGNOSIS — Z510 Encounter for antineoplastic radiation therapy: Secondary | ICD-10-CM | POA: Insufficient documentation

## 2013-07-11 MED ORDER — ENSURE COMPLETE PO LIQD: 237.0000 mL | Freq: Two times a day (BID) | ORAL | Status: AC

## 2013-07-11 MED ORDER — DEXAMETHASONE 4 MG PO TABS: 4.0000 mg | ORAL_TABLET | Freq: Three times a day (TID) | ORAL | Status: DC

## 2013-07-11 MED ORDER — AMLODIPINE BESYLATE 5 MG PO TABS: 5.0000 mg | ORAL_TABLET | Freq: Every day | ORAL | Status: DC

## 2013-07-11 MED ORDER — ONDANSETRON HCL 4 MG PO TABS: 4.0000 mg | ORAL_TABLET | Freq: Four times a day (QID) | ORAL | Status: DC | PRN

## 2013-07-11 MED ORDER — ENSURE COMPLETE PO LIQD: 237.0000 mL | Freq: Two times a day (BID) | ORAL | Status: DC

## 2013-07-11 MED ORDER — BENEPROTEIN PO POWD: 1.0000 | Freq: Three times a day (TID) | ORAL | Status: AC

## 2013-07-11 MED ORDER — RESOURCE INSTANT PROTEIN PO PWD PACKET
1.0000 | Freq: Three times a day (TID) | ORAL | Status: DC
  Filled 2013-07-11: qty 227

## 2013-07-11 MED ORDER — BOOST PLUS PO LIQD: 237.0000 mL | Freq: Three times a day (TID) | ORAL | Status: AC

## 2013-07-11 NOTE — Discharge Summary (Signed)
Physician Discharge Summary  Rachael Wall ZJQ:[id number] DOB: 1940-05-17 DOA: 07/02/2013  PCP: [name], MD  Admit date: 07/02/2013 Discharge date: 07/11/2013  Recommendations for Outpatient Follow-up:  1. please continue taking Decadron 4 mg every 8 hours by mouth. Steroids will be tapered by radiation oncology at your completing the radiation treatment. 2. radiotherapy to start on Monday, please followup per scheduled appointment 3. your blood pressure is 146/68 which is slightly above the goal BP of 120/80; we have prescribed Norvasc 5 mg daily for better blood pressure control.   Discharge Diagnoses:  Principal Problem:   SOB (shortness of breath) Active Problems:   Lung mass   Metastasis   Sinus tachycardia   Hyponatremia   Metastatic lung carcinoma   Unspecified protein-calorie malnutrition   Emphysema lung   Protein-calorie malnutrition, severe   Malignant brain tumor   Acute respiratory failure    Discharge Condition: Medically stable for discharge home today  Diet recommendation: As tolerated  History of present illness:  30 -year-old female with no significant past medical history who presented to [facility] ED 07/02/2013 with worsening shortness of breath over past couple of days prior to this admission. Patient reported shortness of breath has however initially started about a month prior to this admission. She also reported significant weight loss in last couple of months, poor appetite, fatigue and weakness. CT scan was ordered for further evaluation by patient's primary care physician. He has revealed a large lung mass concerning for lung cancer. Further evaluation included CT chest which revealed right lower lobe mass likely primary bronchogenic carcinoma, hepatic metastasis compatible with metastatic disease, adrenal metastatic disease. Additionally MRI brain revealed 3.0 x 2.9 x 2.1 cm heterogeneous enhancing mass lesion left cerebellum consistent with a focal  metastasis and extensive mass effect and surrounding edema in the area of fourth ventricle. Patient underwent resection of left cerebellar of tumor on 3/16. She also had liver biopsy, 07/03/2013. Patient had a simulation done prior to discharge. Patient was instructed to followup with radiation oncology for further radiation treatment.  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 - Shortness of breath likely related to metastatic lung cancer. Patient's respiratory status is stable at this time. - Per oncology patient will need systemic chemotherapy. The course of treatment will be determined by the results of the final pathology. Per Dr. Humphrey Rolls of oncology, care will be established with Dr. Julien Nordmann of oncology for further management of lung cancer - nitiate thoracic radiotherapy for palliation 1 week post craniectomy per radiation oncology   Active Problems:   Severe protein calorie malnutrition - secondary to history of malignancy - diet as tolerated with nutritional supplements    Malignant brain tumor - status post resection by neurosurgery    Hypertensin - started Norvasc 5 mg daily   Signed:  [name], MD  [facility] 07/11/2013, 12:37 PM  Pager #: [phone]  Procedures:  Intubated for operation  Consultations:  Neurosurgery  Radiation oncology   Discharge Exam: Filed Vitals:   07/11/13 0532  BP: 146/68  Pulse: 85  Temp: 97.6 F (36.4 C)  Resp: 18   Filed Vitals:   07/10/13 2112 07/10/13 2125 07/11/13 0532 07/11/13 0733  BP: 149/68  146/68   Pulse: 86  85   Temp: 98.1 F (36.7 C)  97.6 F (36.4 C)   TempSrc: Oral  Oral   Resp: 18  18  Height:      Weight:      SpO2: 100% 98% 98% 97%    General: Pt is alert, follows commands appropriately, not in acute distress Cardiovascular: Regular rate and rhythm, S1/S2 +, no murmurs, no rubs, no  gallops Respiratory: Clear to auscultation bilaterally, no wheezing, no crackles, no rhonchi Abdominal: Soft, non tender, non distended, bowel sounds +, no guarding Extremities: no edema, no cyanosis, pulses palpable bilaterally DP and PT Neuro: Grossly nonfocal  Discharge Instructions  Discharge Orders   Future Appointments Provider Department Dept Phone   07/14/2013 5:55 PM Lora Paula, MD [facility] [phone]   Joint Appt [facility] [phone]   07/15/2013 2:20 PM [facility] [phone]   07/16/2013 2:15 PM [facility] [phone]   07/17/2013 2:40 PM [facility] [phone]   07/18/2013 2:15 PM [facility] [phone]   07/21/2013 2:15 PM [facility] [phone]   07/22/2013 2:15 PM [facility] [phone]   07/23/2013 2:15 PM [facility] [phone]   07/24/2013 2:15 PM [facility] [phone]   07/25/2013 2:15 PM [facility] [phone]   07/28/2013 2:15 PM [facility] [phone]   07/29/2013 2:15 PM [facility] [phone]   07/30/2013 2:15 PM [facility] [phone]   07/31/2013 2:15 PM [facility] [phone]   08/01/2013 2:15 PM [facility] [phone]   08/04/2013 2:15 PM [facility] [phone]   08/05/2013 2:15 PM [facility] [phone]   08/06/2013 2:15 PM [facility] [phone]   08/07/2013 2:15 PM [facility] [phone]   08/08/2013 2:15 PM [facility] [phone]   Future Orders Complete By Expires   Call MD for:  difficulty breathing, headache or visual disturbances  As directed    Call MD for:  persistant dizziness or light-headedness  As directed    Call MD for:  persistant nausea and vomiting  As directed    Call MD for:  redness, tenderness, or signs of infection (pain, swelling, redness, odor or green/yellow discharge around incision site)  As directed    Diet - low sodium heart healthy  As directed    Discharge instructions  As directed    Comments:     1. please continue taking Decadron 4 mg every 8 hours by mouth. Steroids will be tapered by radiation oncology at your completing the radiation treatment. 2. radiotherapy to start on Monday, please followup per scheduled appointment 3. your blood pressure is 146/68 which is slightly above the goal BP of 120/80; we have prescribed Norvasc 5 mg daily for better blood pressure control.   Increase activity slowly  As directed        Medication List    STOP taking these medications       azithromycin 250 MG tablet  Commonly known as:  NOBSJGGEZ  TAKE these medications       amLODipine 5 MG tablet  Commonly known as:  NORVASC  Take 1 tablet (5 mg total) by mouth daily.     budesonide-formoterol 160-4.5 MCG/ACT inhaler  Commonly known as:  SYMBICORT  Inhale 2 puffs into the lungs See admin instructions. Every 9 hours.     dexamethasone 4 MG tablet  Commonly known as:   DECADRON  Take 1 tablet (4 mg total) by mouth every 8 (eight) hours.     dextromethorphan-guaiFENesin 30-600 MG per 12 hr tablet  Commonly known as:  MUCINEX DM  Take 1 tablet by mouth 2 (two) times daily.     feeding supplement (ENSURE COMPLETE) Liqd  Take 237 mLs by mouth 2 (two) times daily between meals.     lactose free nutrition Liqd  Take 237 mLs by mouth 3 (three) times daily between meals.     Iron 325 (65 FE) MG Tabs  Take 1 tablet by mouth 3 (three) times daily.     ondansetron 4 MG tablet  Commonly known as:  ZOFRAN  Take 1 tablet (4 mg total) by mouth every 6 (six) hours as needed for nausea.     pantoprazole 40 MG tablet  Commonly known as:  PROTONIX  Take 40 mg by mouth 2 (two) times daily.     protein supplement Powd  Take 6 g by mouth 3 (three) times daily with meals.           Follow-up Information   Follow up with [name], MD. Schedule an appointment as soon as possible for a visit in 1 week.   Specialty:  Family Medicine   Contact information:   [place] [address] [phone]        The results of significant diagnostics from this hospitalization (including imaging, microbiology, ancillary and laboratory) are listed below for reference.    Significant Diagnostic Studies: Ct Angio Chest Pe W/cm &/or Wo Cm 07/02/2013    IMPRESSION 1. No acute abnormality. 2. Right lower lobe mass is favored to represent primary bronchogenic carcinoma with subcarinal/mediastinal and right hilar adenopathy. Invasion of the right inferior pulmonary vein. Mass effect on the right-greater-than-left pulmonary artery and right lower lobe bronchi. 3. Hepatic metastases. 4. Right-sided pulmonary nodules compatible with metastatic disease. 5. Mass effect on the left brachiocephalic vein and superior vena cava without occlusion. 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 6. Bilateral adrenal  enlargement suspicious for bilateral adrenal metastatic disease.    Mr Jeri Cos Wo Contrast 07/08/2013  IMPRESSION: Postop resection of left cerebellar tumor. Mild subarachnoid hemorrhage is present. 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  There is mild enhancement of the wall of the surgical cavity in left cerebellum. Given that this is a postop day 1 scan, this is suspicious for tumor in the surgical cavity. Close followup is warranted.     Mr Jeri Cos Wo Contrast 07/04/2013     IMPRESSION: 1. 3.0 x 2.9 x 2.1 cm heterogeneous enhancing mass lesion left cerebellum consistent with a focal metastasis. The restricted diffusion suggests this may be a small cell lung cancer. 2. There is extensive mass effect and surrounding edema with effacement of the fourth ventricle. This results in moderate hydrocephalus with transependymal CSF flow. Critical Value/emergent results were called by telephone at the time of interpretation on 07/04/2013 at 5:36 PM to Dr. Beryle Beams, who verbally acknowledged these results.     US Biopsy 07/03/2013  IMPRESSION: 1. Technically successful ultrasound-guided core biopsy of liver lesion     Portable Chest Xray 07/07/2013   IMPRESSION: Endotracheal tube in grossly good position. No acute cardiopulmonary abnormality seen.      Microbiology: No results found for this or any previous visit (from the past 240 hour(s)).   Labs: Basic Metabolic Panel:  Recent Labs Lab 07/06/13 1000 07/07/13 0542 07/08/13 0520 07/10/13 0245  NA 131* 132* 133* 129*  K 3.8 4.1 4.2 4.2  CL 93* 94* 98 93*  CO2 22 24 22 25   GLUCOSE 118* 116* 105* 133*  BUN 16 18 19 15   CREATININE 0.53 0.53 0.58 0.50  CALCIUM 9.7 9.4 8.8 8.9  MG  --   --   --  1.8  PHOS  --   --   --  2.1*   Liver Function Tests: No results found for this basename: AST, ALT, ALKPHOS, BILITOT, PROT, ALBUMIN,  in the last 168 hours No results found for this basename: LIPASE, AMYLASE,  in the last 168 hours No results  found for this basename: AMMONIA,  in the last 168 hours CBC:  Recent Labs Lab 07/06/13 1000 07/07/13 0542 07/08/13 0520 07/10/13 0245  WBC 12.9* 12.2* 14.6* 13.3*  HGB 10.8* 10.3* 10.0* 10.1*  HCT 31.5* 30.1* 29.1* 29.3*  MCV 87.0 86.0 87.1 86.4  PLT 362 356 342 289   Cardiac Enzymes: No results found for this basename: CKTOTAL, CKMB, CKMBINDEX, TROPONINI,  in the last 168 hours BNP: BNP (last 3 results) No results found for this basename: PROBNP,  in the last 8760 hours CBG: No results found for this basename: GLUCAP,  in the last 168 hours  Time coordinating discharge: Over 30 minutes

## 2013-07-11 NOTE — Progress Notes (Signed)
Pt discharged home with daughter in stable condition. Discharge instructions discussed and given to patient and daughter. Both verbalized understanding. DME walker delivered to patient.

## 2013-07-11 NOTE — Discharge Instructions (Signed)
Cancer, Radiation Treatment Radiation therapy uses ionizing beams for killing cancer cells and shrinking tumors. Radiation damages both cancer cells and normal cells, but normal cells have the DNA to repair themselves. Cancer cells do not have DNA to repair themselves and therefore, are killed off by the radiation, and the body disposes of them. The goal of therapy is to kill as many cancer cells without causing harm to healthy cells near the cancer. Radiation therapy may also be used to reduce pain (palliative therapy).  RADIATION IS USED FOR DIFFERENT REASONS  As the primary or only treatment for the cancer.  Used before surgery to shrink a tumor.  Used after surgery to stop the growth of any remaining cancer cells.  Used in combination with other treatments to kill cancer cells.  Used in advanced cancer patients to help with symptoms of the cancer. RADIATION THERAPY MAY BE EXTERNAL OR INTERNAL  External beam radiation is used most often. It is given on an outpatient basis. This means you do not have to be hospitalized. The energy (source of radiation) used in external radiation therapy may come from X-rays or gamma rays. Although they are produced in different ways, both use packets of energy (photons). Lower energy beams can be used to destroy cancer cells on the surface of the body. Higher energy beams are used to treat cancer deep within the body. Compared with some other types of radiation, x-rays can deliver radiation to a fairly large area.  Internal radiation is called brachytherapy. There is a lose dose rate (LDR) where permanent seeds are implanted into the patient, usually used for GYN or Prostate cancer. This procedure is an inpatient procedure. High dose rate brachytherapy (HDR) is another type of radiation, which is an outpatient procedure. Needles are inserted into the patient, and the radiation travels through these needles to deliver the dose prescribed. This procedure is mainly  used for prostate and breast cancers. Both of these types use a live source of radiation. Radiation therapy may be used to treat almost all types of tumors. It is also used to treat leukemia and lymphoma. There are a few that are more radio resistant and may not respond to radiation alone. These are cancers of the blood-forming cells and lymphatic system.  For some types of cancer, radiation may be given to areas that do not have evidence of cancer. This is done to prevent cancer cells from growing in the area receiving the radiation. This technique is called prophylactic radiation therapy.  RISKS AND COMPLICATIONS Side effects of radiation therapy depend on which part of your body is exposed to radiation and how much radiation is used. Most people are affected by fatigue, which is the most common side effect.  Everybody deals with radiation differently. You cannot predict what the side effects will be. They do not occur right away, and it can take 2-3 weeks to develop side effects. Most side effects are temporary and can be controlled. Once the treatment is complete, the side effects do not stop right away. It can take up to 3-4 weeks to regain your energy or for the redness to go away. Your body does heal from the radiation. Some common side effects are:  Difficulty swallowing, coughing (head and neck cancers and lung cancers).  Feeling sick to your stomach (nauseous), vomiting and/or diarrhea (abdominal area or pelvis being treated).  Bladder problems, frequent urination and/or sexual dysfunction (bladder, kidney, prostate cancer).  Hair loss (brain tumors). BEFORE THE PROCEDURE There will  be a planning simulation usually in the radiation department using a CT planning scan. Your radiation oncologist will plan exactly where the radiation will be delivered. The treatment fields will be planned just for you in order to treat you the best way possible. They also make a plan to avoid critical  structures in the body. You may also be given a dye (contrast) during this CT planning scan or an MRI.  You will be positioned how you will be everyday for your treatment. The goal is to have a position that can be reproduced daily.  Usually, temporary marks will be placed on your body to give the therapists a place to shift from once the plan is complete. Then, tattoos are usually put on you in order for you to line up in the same way each day. These tattoos are permanent, but are the size of a freckle. PROCEDURE  You will lie perfectly still on a table in the position determined for treatment, while the linear accelerator moves around you.  This machine delivers the radiation in exact doses from many possible angles.  Treatments are usually spread out over several weeks. This is to allow your healthy cells to recover between sessions. Usually, treatments are spread out between 5-6 weeks, but this is determined by your radiation oncologist.  There is no pain during this treatment. You will not feel the radiation being delivered. The treatment takes about 15-20 minutes, including setup time. AFTER THE PROCEDURE You will have tests and follow-up appointments. They are usually 6 weeks to 6 months after radiation to see if the treatment helped reduce pain or eliminate the cancer cells. Document Released: 08/27/2008 Document Revised: 07/03/2011 Document Reviewed: 08/27/2008 [facility] Patient Information 2014 [place], [place].

## 2013-07-11 NOTE — Care Management Note (Signed)
CM spoke with patient with family at bedside concerning discharge planning. MD order for HHPT. Pt offered choice, per pt choice [facility] to provide [facility] services. [facility] rep [employer] notified. Per pt's payer [facility] [employer] to provide ordered DME. No other needs assessed at this time.    Venita Lick Caelin Rosen,MSN,RN [phone]

## 2013-07-11 NOTE — Progress Notes (Signed)
  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  NARRATIVE:  The patient was brought to the [facility].  Identity was confirmed.  All relevant records and images related to the planned course of therapy were reviewed.  The patient freely provided informed written consent to proceed with treatment after reviewing the details related to the planned course of therapy. The consent form was witnessed and verified by the simulation staff.  Then, the patient was set-up in a stable reproducible  supine position for radiation therapy.  CT images were obtained.  Surface markings were placed.  The CT images were loaded into the planning software.  Then the target and avoidance structures were contoured.  Treatment planning then occurred.  The radiation prescription was entered and confirmed.  Then, I designed and supervised the construction of a total of 3 medically necessary complex treatment devices including a thermoplastic mask across her face and shoulders for patient positioning as well as 2 multileaf collimators to shape radiation from the anterior and posterior gantry positions.  I have requested : Isodose Plan.  I have ordered:Nutrition Consult  PLAN:  The patient will receive 35 Gy in 14 fractions.  ________________________________  Sheral Apley Tammi Klippel, M.D.

## 2013-07-11 NOTE — Progress Notes (Signed)
PT Cancellation Note  Patient Details Name: Rachael Wall MRN: [id number] DOB: 25-Jan-1941   Cancelled Treatment:    Reason Eval/Treat Not Completed: Other (comment) (pt refused, stated she's about to DC home.)   Philomena Doheny 07/11/2013, 1:46 PM [phone]

## 2013-07-14 ENCOUNTER — Ambulatory Visit
Admission: RE | Admit: 2013-07-14 | Discharge: 2013-07-14 | Disposition: A | Discharge status: Home / Self Care | Payer: Medicare HMO | Source: Ambulatory Visit | Attending: Radiation Oncology | Admitting: Radiation Oncology

## 2013-07-14 DIAGNOSIS — C799 Secondary malignant neoplasm of unspecified site: Secondary | ICD-10-CM

## 2013-07-14 NOTE — Progress Notes (Signed)
  Radiation Oncology         (336) [phone] ________________________________  Name: Rachael Wall MRN: [id number]  Date: 07/14/2013  DOB: 1940-08-06  Simulation Verification Note  Status: outpatient  NARRATIVE: The patient was brought to the treatment unit and placed in the planned treatment position. The clinical setup was verified. Then port films were obtained and uploaded to the radiation oncology medical record software.  The treatment beams were carefully compared against the planned radiation fields. The position location and shape of the radiation fields was reviewed. They targeted volume of tissue appears to be appropriately covered by the radiation beams. Organs at risk appear to be excluded as planned.  Based on my personal review, I approved the simulation verification. The patient's treatment will proceed as planned.  ------------------------------------------------  Sheral Apley Tammi Klippel, M.D.

## 2013-07-15 ENCOUNTER — Ambulatory Visit
Admission: RE | Admit: 2013-07-15 | Discharge: 2013-07-15 | Disposition: A | Discharge status: Home / Self Care | Payer: Medicare HMO | Source: Ambulatory Visit | Attending: Radiation Oncology | Admitting: Radiation Oncology

## 2013-07-15 DIAGNOSIS — C7800 Secondary malignant neoplasm of unspecified lung: Secondary | ICD-10-CM

## 2013-07-15 MED ORDER — RADIAPLEXRX EX GEL
Freq: Once | CUTANEOUS | Status: AC
Start: 2013-07-15 — End: 2013-07-15
  Administered 2013-07-15: 16:00:00 via TOPICAL

## 2013-07-15 NOTE — Progress Notes (Signed)
Completed post sim education with the patient and her daughter, Apolonio Schneiders. Oriented them both to staff and routine of the clinic. Provided patient with RADIATION THERAPY AND YOU handbook then, reviewed pertinent information. Educated patient reference potential side effects and management such as, skin and throat changes. Provided patient with radiaplex gel and directed upon [place]. Patient still has stitches from surgery with Dr. Arnoldo Morale on 07/10/2013. Patient questions when they will be removed. Phoned Becky at Dr. Newman Pies office requesting an appt. No answer left message. Daughter questions if patient should continue to see PCP. Strongly encouraged patient to continue to follow up with PCP or establish one she trusted. Patient and her daughter verbalized understanding.

## 2013-07-16 ENCOUNTER — Ambulatory Visit
Admission: RE | Admit: 2013-07-16 | Discharge: 2013-07-16 | Disposition: A | Discharge status: Home / Self Care | Payer: Medicare HMO | Source: Ambulatory Visit | Attending: Radiation Oncology | Admitting: Radiation Oncology

## 2013-07-17 ENCOUNTER — Ambulatory Visit
Admission: RE | Admit: 2013-07-17 | Discharge: 2013-07-17 | Disposition: A | Discharge status: Home / Self Care | Payer: Medicare HMO | Source: Ambulatory Visit | Attending: Radiation Oncology | Admitting: Radiation Oncology

## 2013-07-17 ENCOUNTER — Encounter: Payer: Self-pay | Admitting: Radiation Oncology

## 2013-07-17 NOTE — Progress Notes (Signed)
  Radiation Oncology         [phone] ________________________________  Name: Rachael Wall  MRN: [id number]  Date: 07/18/2013  DOB: 1940/12/08  Weekly Radiation Therapy Management  Current Dose: 12.5 Gy     Planned Dose:  35 Gy  Narrative . . . . . . . . The patient presents for routine under treatment assessment.                                   The patient is without complaint. no skin changes, has radiaplex to start using daily, starting to get sore throat nowm, eating some better, no pain, nausea, sob, 98% room air, Drinks either ensure or boost b/w meals                                 Set-up films were reviewed.                                 The chart was checked. Physical Findings. . .  weight is 96 lb 6.4 oz (43.727 kg). Her oral temperature is 97.7 F (36.5 C). Her blood pressure is 106/68 and her pulse is 95. Her respiration is 20 and oxygen saturation is 98%. . Weight essentially stable.  No significant changes. Impression . . . . . . . The patient is tolerating radiation. Plan . . . . . . . . . . . . Continue treatment as planned.  ________________________________  Sheral Apley. Tammi Klippel, M.D.

## 2013-07-18 ENCOUNTER — Ambulatory Visit
Admission: RE | Admit: 2013-07-18 | Discharge: 2013-07-18 | Disposition: A | Discharge status: Home / Self Care | Payer: Medicare HMO | Source: Ambulatory Visit | Attending: Radiation Oncology | Admitting: Radiation Oncology

## 2013-07-18 ENCOUNTER — Telehealth: Payer: Self-pay | Admitting: Radiation Oncology

## 2013-07-18 ENCOUNTER — Encounter: Payer: Self-pay | Admitting: Radiation Oncology

## 2013-07-18 VITALS — BP 106/68 | HR 95 | Temp 97.7°F | Resp 20 | Wt 96.4 lb

## 2013-07-18 DIAGNOSIS — C7800 Secondary malignant neoplasm of unspecified lung: Secondary | ICD-10-CM

## 2013-07-18 MED ORDER — SUCRALFATE 1 G PO TABS: 1.0000 g | ORAL_TABLET | Freq: Three times a day (TID) | ORAL | Status: AC

## 2013-07-18 NOTE — Progress Notes (Signed)
Wee kl;y rad txs, 5/14 mediastinum, no skin changes, has radiaplex to start using daily, starting to get sore throat nowm, eating some better, no pain, nausea, sob, 98% room air,  Drinks either ensure or boost b/w meals 5:22 PM

## 2013-07-18 NOTE — Telephone Encounter (Signed)
Phoned patient's daughter, Apolonio Schneiders. Informed her that her mother has an appointment with Dr. Newman Pies on 3/31 at 1600 to have her sutures removed. I provided her with the address and phone number to [facility]. She verbalized understanding of all reviewed.

## 2013-07-21 ENCOUNTER — Encounter: Payer: Self-pay | Admitting: Radiation Oncology

## 2013-07-21 ENCOUNTER — Ambulatory Visit
Admission: RE | Admit: 2013-07-21 | Discharge: 2013-07-21 | Disposition: A | Discharge status: Home / Self Care | Payer: Medicare HMO | Source: Ambulatory Visit | Attending: Radiation Oncology | Admitting: Radiation Oncology

## 2013-07-22 ENCOUNTER — Ambulatory Visit
Admission: RE | Admit: 2013-07-22 | Discharge: 2013-07-22 | Disposition: A | Discharge status: Home / Self Care | Payer: Medicare HMO | Source: Ambulatory Visit | Attending: Radiation Oncology | Admitting: Radiation Oncology

## 2013-07-22 NOTE — Progress Notes (Signed)
Patient not seen for PUT today because she had an appointment at her neurosurgeons office to have her stitches removed. Patient good in block per therapist, Elmyra Ricks.

## 2013-07-23 ENCOUNTER — Encounter: Payer: Self-pay | Admitting: Radiation Oncology

## 2013-07-23 ENCOUNTER — Ambulatory Visit
Admission: RE | Admit: 2013-07-23 | Discharge: 2013-07-23 | Disposition: A | Discharge status: Home / Self Care | Payer: Medicare HMO | Source: Ambulatory Visit | Attending: Radiation Oncology | Admitting: Radiation Oncology

## 2013-07-23 VITALS — BP 111/75 | HR 105 | Temp 97.5°F | Resp 20 | Wt 92.2 lb

## 2013-07-23 DIAGNOSIS — C719 Malignant neoplasm of brain, unspecified: Secondary | ICD-10-CM

## 2013-07-23 MED ORDER — FLUCONAZOLE 100 MG PO TABS: ORAL_TABLET | ORAL | Status: DC

## 2013-07-23 NOTE — Progress Notes (Addendum)
Pt denies pain, cough. She has SOB w/exertion and while doing her physical therapy, appetite comes and goes, slight sore throat, fatigue. Pt taking Sucralfate, Decadron 4 mg every eight hours.  Daughter states pt weighed 94 lbs when she was d/c from hospital 1 week ago. Daughter adds whey protein to drinks, foods, and patient drinks 2 cans Boost daily.

## 2013-07-23 NOTE — Progress Notes (Signed)
Simulation Verification Note Posterior fossa OUTPATIENT  The patient was brought to the treatment unit and placed in the planned treatment position. The clinical setup was verified. Then port films were obtained and uploaded to the radiation oncology medical record software.  The treatment beams were carefully compared against the planned radiation fields. The position location and shape of the radiation fields was reviewed. They targeted volume of tissue appears to be appropriately covered by the radiation beams. Organs at risk appear to be excluded as planned.  Based on my personal review, I approved the simulation verification. The patient's treatment will proceed as planned.  -----------------------------------  Eppie Gibson, MD

## 2013-07-23 NOTE — Progress Notes (Signed)
   Weekly Management Note:  outpatient Current Dose:  20 Gy mediastinum, 3 Gy post fossa Projected Dose: 35 Gy mediastinum, 30 Gy post fossa  Narrative:  The patient presents for routine under treatment assessment.  CBCT/MVCT images/Port film x-rays were reviewed.  The chart was checked. Patient denies new respiratory issues, and only needs sucralfate occasionally for odynophagia.  She is on Decadron 4mg  TID.  She had her stitches removed by [facility] yesterday.  Has lost quite a bit of weight ( ~15lb in 2 weeks)  Physical Findings:  weight is 92 lb 3.2 oz (41.822 kg). Her oral temperature is 97.5 F (36.4 C). Her blood pressure is 111/75 and her pulse is 105. Her respiration is 20 and oxygen saturation is 97%.  scant thrush over palate.  Posterior fossa scar intact. In wheelchair  CBC    Component Value Date/Time   WBC 13.3* 07/10/2013 0245   RBC 3.39* 07/10/2013 0245   HGB 10.1* 07/10/2013 0245   HCT 29.3* 07/10/2013 0245   PLT 289 07/10/2013 0245   MCV 86.4 07/10/2013 0245   MCH 29.8 07/10/2013 0245   MCHC 34.5 07/10/2013 0245   RDW 14.5 07/10/2013 0245   LYMPHSABS 1.8 07/02/2013 1541   MONOABS 1.7* 07/02/2013 1541   EOSABS 0.1 07/02/2013 1541   BASOSABS 0.0 07/02/2013 1541     CMP     Component Value Date/Time   NA 129* 07/10/2013 0245   K 4.2 07/10/2013 0245   CL 93* 07/10/2013 0245   CO2 25 07/10/2013 0245   GLUCOSE 133* 07/10/2013 0245   BUN 15 07/10/2013 0245   CREATININE 0.50 07/10/2013 0245   CALCIUM 8.9 07/10/2013 0245   PROT 8.2 07/02/2013 1541   ALBUMIN 3.7 07/02/2013 1541   AST 85* 07/02/2013 1541   ALT 27 07/02/2013 1541   ALKPHOS 260* 07/02/2013 1541   BILITOT 0.6 07/02/2013 1541   GFRNONAA >90 07/10/2013 0245   GFRAA >90 07/10/2013 0245     Impression:  The patient is tolerating radiotherapy.   Plan:  Continue radiotherapy as planned. Referral to nutrition for weight loss. Diflucan for thrush.  Pt/family enthusiastic about plan.  -----------------------------------  Eppie Gibson, MD

## 2013-07-24 ENCOUNTER — Ambulatory Visit
Admission: RE | Admit: 2013-07-24 | Discharge: 2013-07-24 | Disposition: A | Discharge status: Home / Self Care | Payer: Medicare HMO | Source: Ambulatory Visit | Attending: Radiation Oncology | Admitting: Radiation Oncology

## 2013-07-25 ENCOUNTER — Ambulatory Visit
Admission: RE | Admit: 2013-07-25 | Discharge: 2013-07-25 | Disposition: A | Discharge status: Home / Self Care | Payer: Medicare HMO | Source: Ambulatory Visit | Attending: Radiation Oncology | Admitting: Radiation Oncology

## 2013-07-28 ENCOUNTER — Ambulatory Visit
Admission: RE | Admit: 2013-07-28 | Discharge: 2013-07-28 | Disposition: A | Discharge status: Home / Self Care | Payer: Medicare HMO | Source: Ambulatory Visit | Attending: Radiation Oncology | Admitting: Radiation Oncology

## 2013-07-28 ENCOUNTER — Encounter: Payer: Self-pay | Admitting: Radiation Oncology

## 2013-07-28 ENCOUNTER — Telehealth: Payer: Self-pay | Admitting: Radiation Oncology

## 2013-07-28 VITALS — BP 98/67 | HR 114 | Temp 97.4°F | Ht 59.0 in | Wt 91.9 lb

## 2013-07-28 DIAGNOSIS — C719 Malignant neoplasm of brain, unspecified: Secondary | ICD-10-CM

## 2013-07-28 MED ORDER — FUROSEMIDE 20 MG PO TABS: 20.0000 mg | ORAL_TABLET | Freq: Every day | ORAL | Status: AC

## 2013-07-28 MED ORDER — DEXAMETHASONE 4 MG PO TABS: 4.0000 mg | ORAL_TABLET | ORAL | Status: DC

## 2013-07-28 NOTE — Progress Notes (Signed)
  Radiation Oncology         [phone] ________________________________  Name: Rachael Wall MRN: [id number]  Date: 07/28/2013  DOB: [birth year]  Weekly Radiation Therapy Management  Current Dose: 12 Gy     Planned Dose:  30 Gy  Narrative . . . . . . . . The patient presents for routine under treatment assessment.                                   She has some painless ankle swelling which dissipates by elevating her feet since starting steroids                                 Set-up films were reviewed.                                 The chart was checked. Physical Findings. . .  height is 4\' 11"  (1.499 m) and weight is 91 lb 14.4 oz (41.686 kg). Her temperature is 97.4 F (36.3 C). Her blood pressure is 98/67 and her pulse is 114. Her oxygen saturation is 94%. . Weight essentially stable.  No significant changes.  1-2+ ankle edema Impression . . . . . . . The patient is tolerating radiation. She has some peripheral edema likely related to steroids. Plan . . . . . . . . . . . . Continue treatment as planned.  Continue steroid taper.  Given prn Lasix.   ________________________________  Sheral Apley Tammi Klippel, M.D.

## 2013-07-28 NOTE — Telephone Encounter (Signed)
Provided Sherald Hess with authorization confirmation fax to scan into Epic.

## 2013-07-28 NOTE — Progress Notes (Addendum)
Rachael Wall has had 11 fractions to her mediastinum and 4 fractions to her posterior fossa.  She denies pain.  She has noticed swelling in both of her lower legs and feet that started on Saturday.  She says when she elevates her legs the swelling goes away.  She denies pain in her legs.  She reports trouble walking due to her feet being swollen. She denies dizziness, nausea and vominting, cough, vision changes and shortness of breath.  She is taking decadron 4 mg every 8 hours.  She denies fatigue.  She reports that her appetite goes up and down.  She has an appointment with the dietician this week.  The skin on her upper chest is pink.  Patient reports constipation and wants to know if it is OK to take Mag Citrate with her current medications.  Advised patient to ask the pharmacist where she has her medications filled.  She is using radiaplex gel.

## 2013-07-29 ENCOUNTER — Ambulatory Visit
Admission: RE | Admit: 2013-07-29 | Discharge: 2013-07-29 | Disposition: A | Discharge status: Home / Self Care | Payer: Medicare HMO | Source: Ambulatory Visit | Attending: Radiation Oncology | Admitting: Radiation Oncology

## 2013-07-30 ENCOUNTER — Ambulatory Visit
Admission: RE | Admit: 2013-07-30 | Discharge: 2013-07-30 | Disposition: A | Discharge status: Home / Self Care | Payer: Medicare HMO | Source: Ambulatory Visit | Attending: Radiation Oncology | Admitting: Radiation Oncology

## 2013-07-31 ENCOUNTER — Ambulatory Visit
Admission: RE | Admit: 2013-07-31 | Discharge: 2013-07-31 | Disposition: A | Discharge status: Home / Self Care | Payer: Medicare HMO | Source: Ambulatory Visit | Attending: Radiation Oncology | Admitting: Radiation Oncology

## 2013-07-31 ENCOUNTER — Ambulatory Visit: Payer: Medicare HMO

## 2013-07-31 ENCOUNTER — Ambulatory Visit: Payer: Commercial Managed Care - HMO | Admitting: Nutrition

## 2013-07-31 NOTE — Progress Notes (Signed)
Patient is a 73 year old female diagnosed with a malignant brain tumor.  She is receiving radiation therapy.  She is a patient of Dr. Isidore Moos.  Past medical history includes diverticulitis.  Medications include Decadron, Mucinex, iron, Zofran, Protonix, and Carafate.  Labs were reviewed.  Height: 4 feet 11 inches. Weight: 91.9 pounds April 6. Usual body weight: 107 pounds. BMI: 18.55.   Patient presents in a wheelchair with her daughter.  Patient lives with her daughter and her daughter's husband.  Patient reports appetite comes and goes.  She does have occasional nausea.  She continues to have edema, which improves when she elevates her feet.  She has constipation but has a plan in place to begin magnesium citrate tomorrow night.  Nutrition diagnosis: Inadequate oral intake related to diagnosis of malignant brain tumor with associated poor appetite as evidenced by 14% weight loss.  Intervention: Patient and daughter were educated on strategies for increasing calories and protein utilizing oral nutrition supplements and smoothies/milk shakes throughout the day.  Encouraged patient to take nausea medication on a regular basis.  Reviewed strategies for helping patient remember to eat.  Reviewed recipes with patient and daughter.  Provided fact sheets, recipes booklets, and oral nutrition supplement samples.  Questions were answered.  Teach back method used.  Monitoring, evaluation, goals: Patient will tolerate increased oral intake to minimize further weight loss and improve quality-of-life.  Next visit: Patient will contact me by phone if she has questions or concerns.

## 2013-08-01 ENCOUNTER — Ambulatory Visit: Payer: Medicare HMO

## 2013-08-01 ENCOUNTER — Ambulatory Visit: Payer: Medicare HMO | Admitting: Radiation Oncology

## 2013-08-04 ENCOUNTER — Ambulatory Visit: Payer: Medicare HMO

## 2013-08-04 ENCOUNTER — Ambulatory Visit
Admission: RE | Admit: 2013-08-04 | Discharge: 2013-08-04 | Disposition: A | Discharge status: Home / Self Care | Payer: Medicare HMO | Source: Ambulatory Visit | Attending: Radiation Oncology | Admitting: Radiation Oncology

## 2013-08-04 ENCOUNTER — Encounter: Payer: Self-pay | Admitting: Radiation Oncology

## 2013-08-04 VITALS — BP 81/59 | HR 135 | Temp 97.4°F | Ht 59.0 in | Wt 87.9 lb

## 2013-08-04 DIAGNOSIS — C7800 Secondary malignant neoplasm of unspecified lung: Secondary | ICD-10-CM

## 2013-08-04 DIAGNOSIS — C719 Malignant neoplasm of brain, unspecified: Secondary | ICD-10-CM

## 2013-08-04 MED ORDER — OXYCODONE-ACETAMINOPHEN 5-325 MG PO TABS: 1.0000 | ORAL_TABLET | Freq: Four times a day (QID) | ORAL | Status: AC | PRN

## 2013-08-04 MED ORDER — DEXAMETHASONE 4 MG PO TABS: 2.0000 mg | ORAL_TABLET | Freq: Two times a day (BID) | ORAL | Status: DC

## 2013-08-04 NOTE — Addendum Note (Signed)
Encounter addended by: Lora Paula, MD on: 08/04/2013  6:50 PM<BR>     Documentation filed: Orders

## 2013-08-04 NOTE — Progress Notes (Signed)
  Radiation Oncology         [phone] ________________________________  Name: Rachael Wall  MRN: [id number]  Date: 08/04/2013  DOB: [birth year]  Weekly Radiation Therapy Management  Current Dose: 24 Gy     Planned Dose:  30 Gy  Narrative . . . . . . . . The patient presents for routine under treatment assessment.                                  Rachael Wall has had 14 fractions to her mediastinum and 8 fractions to her posterior fossa. She reports having pain in her lower back when standing. This started Sunday after she took Magnesium Citrate for constipation. She said it feels like something is pinching across her lower back. She has lost 4 lbs since 07/28/13. She is having nausea and takes zofran which is not helping. Orthostatic vitals done: bp sitting 98/54, hr 121, bp standing 81/59 and hr 135. She is wondering if there is anything she can take to increase her appetite. She reports fatigue and weakness.  She denies dizziness, balance issues, vision changes, cough and sore throat. She does report shortness of breath with activity. Her oxygen saturation today is 96% on room air. The skin on her mid chest area is pink. She is using radiaplex gel                                 Set-up films were reviewed.                                 The chart was checked. Physical Findings. . .  height is 4\' 11"  (1.499 m) and weight is 87 lb 14.4 oz (39.871 kg). Her temperature is 97.4 F (36.3 C). Her blood pressure is 81/59 and her pulse is 135. Her oxygen saturation is 96%. . Weight essentially stable.  No significant changes. Impression . . . . . . . The patient is tolerating radiation. Plan . . . . . . . . . . . . Continue treatment as planned.  Given Percocet.  Ordered staging PET and oncology consult/follow-up  ________________________________  Sheral Apley. Tammi Klippel, M.D.

## 2013-08-04 NOTE — Progress Notes (Signed)
Rachael Wall has had 14 fractions to her mediastinum and 8 fractions to her posterior fossa.  She reports having pain in her lower back when standing.  This started Sunday after she took Magnesium Citrate for constipation.  She said it feels like something is pinching across her lower back.  She has lost 4 lbs since 07/28/13.  She is having nausea and takes zofran which is not helping.  Orthostatic vitals done: bp sitting 98/54, hr 121, bp standing 81/59 and hr 135.  She is wondering if there is anything she can take to increase her appetite.  She reports fatigue and weakness.  She denies dizziness, balance issues, vision changes, cough and sore throat.  She does report shortness of breath with activity.  Her oxygen saturation today is 96% on room air.  The skin on her mid chest area is pink.  She is using radiaplex gel.

## 2013-08-05 ENCOUNTER — Ambulatory Visit: Payer: Commercial Managed Care - HMO

## 2013-08-05 ENCOUNTER — Telehealth: Payer: Self-pay | Admitting: Oncology

## 2013-08-05 ENCOUNTER — Ambulatory Visit
Admission: RE | Admit: 2013-08-05 | Discharge: 2013-08-05 | Disposition: A | Discharge status: Home / Self Care | Payer: Medicare HMO | Source: Ambulatory Visit | Attending: Radiation Oncology | Admitting: Radiation Oncology

## 2013-08-05 ENCOUNTER — Ambulatory Visit: Payer: Medicare HMO

## 2013-08-05 ENCOUNTER — Telehealth: Payer: Self-pay | Admitting: Internal Medicine

## 2013-08-05 VITALS — BP 100/49 | HR 104 | Temp 95.7°F | Resp 19

## 2013-08-05 DIAGNOSIS — C7800 Secondary malignant neoplasm of unspecified lung: Secondary | ICD-10-CM

## 2013-08-05 MED ORDER — DEXTROSE-NACL 5-0.9 % IV SOLN
Freq: Once | INTRAVENOUS | Status: AC
  Administered 2013-08-05: 15:00:00 via INTRAVENOUS

## 2013-08-05 NOTE — Addendum Note (Signed)
Encounter addended by: Jacqulyn Liner, RN on: 08/05/2013  8:54 AM<BR>     Documentation filed: Demographics Visit

## 2013-08-05 NOTE — Telephone Encounter (Signed)
Called Rachael Wall, Calley's daughter, to let her know that Denicia has an appointment for IV fluids at 3:00 today.  Rachael Wall verbalized agreement.

## 2013-08-05 NOTE — Patient Instructions (Signed)
Dehydration, Adult Dehydration is when you lose more fluids from the body than you take in. Vital organs like the kidneys, brain, and heart cannot function without a proper amount of fluids and salt. Any loss of fluids from the body can cause dehydration.  CAUSES   Vomiting.  Diarrhea.  Excessive sweating.  Excessive urine output.  Fever. SYMPTOMS  Mild dehydration  Thirst.  Dry lips.  Slightly dry mouth. Moderate dehydration  Very dry mouth.  Sunken eyes.  Skin does not bounce back quickly when lightly pinched and released.  Dark urine and decreased urine production.  Decreased tear production.  Headache. Severe dehydration  Very dry mouth.  Extreme thirst.  Rapid, weak pulse (more than 100 beats per minute at rest).  Cold hands and feet.  Not able to sweat in spite of heat and temperature.  Rapid breathing.  Blue lips.  Confusion and lethargy.  Difficulty being awakened.  Minimal urine production.  No tears. DIAGNOSIS  Your caregiver will diagnose dehydration based on your symptoms and your exam. Blood and urine tests will help confirm the diagnosis. The diagnostic evaluation should also identify the cause of dehydration. TREATMENT  Treatment of mild or moderate dehydration can often be done at home by increasing the amount of fluids that you drink. It is best to drink small amounts of fluid more often. Drinking too much at one time can make vomiting worse. Refer to the home care instructions below. Severe dehydration needs to be treated at the hospital where you will probably be given intravenous (IV) fluids that contain water and electrolytes. HOME CARE INSTRUCTIONS   Ask your caregiver about specific rehydration instructions.  Drink enough fluids to keep your urine clear or pale yellow.  Drink small amounts frequently if you have nausea and vomiting.  Eat as you normally do.  Avoid:  Foods or drinks high in sugar.  Carbonated  drinks.  Juice.  Extremely hot or cold fluids.  Drinks with caffeine.  Fatty, greasy foods.  Alcohol.  Tobacco.  Overeating.  Gelatin desserts.  Wash your hands well to avoid spreading bacteria and viruses.  Only take over-the-counter or prescription medicines for pain, discomfort, or fever as directed by your caregiver.  Ask your caregiver if you should continue all prescribed and over-the-counter medicines.  Keep all follow-up appointments with your caregiver. SEEK MEDICAL CARE IF:  You have abdominal pain and it increases or stays in one area (localizes).  You have a rash, stiff neck, or severe headache.  You are irritable, sleepy, or difficult to awaken.  You are weak, dizzy, or extremely thirsty. SEEK IMMEDIATE MEDICAL CARE IF:   You are unable to keep fluids down or you get worse despite treatment.  You have frequent episodes of vomiting or diarrhea.  You have blood or green matter (bile) in your vomit.  You have blood in your stool or your stool looks black and tarry.  You have not urinated in 6 to 8 hours, or you have only urinated a small amount of very dark urine.  You have a fever.  You faint. MAKE SURE YOU:   Understand these instructions.  Will watch your condition.  Will get help right away if you are not doing well or get worse. Document Released: 04/10/2005 Document Revised: 07/03/2011 Document Reviewed: 11/28/2010 [employer] Patient Information 2014 [employer].  

## 2013-08-05 NOTE — Telephone Encounter (Signed)
S/W PATIENT DTR AND GAVE NEW PATIENT FOR 04/21 @ 1:30 W/DR. MOHAMED.  DX- METS LUNG CARCINOMA  WELCOME PACKET MAILED.

## 2013-08-06 ENCOUNTER — Encounter: Payer: Self-pay | Admitting: Radiation Oncology

## 2013-08-06 ENCOUNTER — Ambulatory Visit: Payer: Medicare HMO

## 2013-08-06 ENCOUNTER — Ambulatory Visit
Admission: RE | Admit: 2013-08-06 | Discharge: 2013-08-06 | Disposition: A | Discharge status: Home / Self Care | Payer: Medicare HMO | Source: Ambulatory Visit | Attending: Radiation Oncology | Admitting: Radiation Oncology

## 2013-08-06 VITALS — BP 113/71 | HR 115 | Temp 97.4°F | Resp 20 | Wt 90.8 lb

## 2013-08-06 DIAGNOSIS — C7949 Secondary malignant neoplasm of other parts of nervous system: Principal | ICD-10-CM

## 2013-08-06 DIAGNOSIS — C7931 Secondary malignant neoplasm of brain: Secondary | ICD-10-CM

## 2013-08-06 NOTE — Progress Notes (Signed)
Pt denies pain, HA, nausea, blurred vision, dizziness, cough, SOB. She has loss of appetite, fatigue, weakness. She is currently taking Decadron 1/2 tab twice daily.She is receiving PT twice weekly. She reports urinary incontinence "since beginning radiation treatment". She asks "if there is anything she can take for this". Advised she discuss w/Dr Tammi Klippel; informed her that this is not a side effect of her radiation to mediastinum and brain.  She completed treatment today; has FU card.

## 2013-08-06 NOTE — Progress Notes (Signed)
  Radiation Oncology         [phone] ________________________________  Name: Rachael Wall MRN: [id number]  Date: 08/06/2013  DOB: 14-Jun-1940  Weekly Radiation Therapy Management  Current Dose: 30 Gy     Planned Dose:  30 Gy  Narrative . . . . . . . . The patient presents for the final under treatment assessment.                                            Pt denies pain, HA, nausea, blurred vision, dizziness, cough, SOB. She has loss of appetite, fatigue, weakness. She is currently taking Decadron 1/2 tab twice daily.She is receiving PT twice weekly. She reports urinary incontinence "since beginning radiation treatment". She asks "if there is anything she can take for this". Advised she discuss w/Dr Tammi Klippel; informed her that this is not a side effect of her radiation to mediastinum and brain.  She completed treatment today; has FU card                                 Set-up films were reviewed.                                 The chart was checked. Physical Findings. . . Weight essentially stable.  No significant changes. Impression . . . . . . . The patient tolerated radiation relatively well. Plan . . . . . . . . . . . . Complete radiation today as scheduled, and follow-up in one month. The patient was encouraged to call or return to the clinic in the interim for any worsening symptoms.  She has steroid taper and will do Keegel exercises  ________________________________  Sheral Apley. Tammi Klippel, M.D.

## 2013-08-07 ENCOUNTER — Ambulatory Visit: Payer: Medicare HMO

## 2013-08-08 ENCOUNTER — Ambulatory Visit (HOSPITAL_COMMUNITY)
Admission: RE | Admit: 2013-08-08 | Discharge: 2013-08-08 | Disposition: A | Discharge status: Home / Self Care | Payer: Medicare HMO | Source: Ambulatory Visit | Attending: Radiation Oncology | Admitting: Radiation Oncology

## 2013-08-08 ENCOUNTER — Telehealth: Payer: Self-pay | Admitting: Oncology

## 2013-08-08 ENCOUNTER — Ambulatory Visit: Payer: Medicare HMO

## 2013-08-08 DIAGNOSIS — C7951 Secondary malignant neoplasm of bone: Secondary | ICD-10-CM | POA: Insufficient documentation

## 2013-08-08 DIAGNOSIS — C3490 Malignant neoplasm of unspecified part of unspecified bronchus or lung: Secondary | ICD-10-CM | POA: Insufficient documentation

## 2013-08-08 DIAGNOSIS — C771 Secondary and unspecified malignant neoplasm of intrathoracic lymph nodes: Secondary | ICD-10-CM | POA: Insufficient documentation

## 2013-08-08 DIAGNOSIS — C787 Secondary malignant neoplasm of liver and intrahepatic bile duct: Secondary | ICD-10-CM | POA: Insufficient documentation

## 2013-08-08 DIAGNOSIS — C7800 Secondary malignant neoplasm of unspecified lung: Secondary | ICD-10-CM

## 2013-08-08 DIAGNOSIS — C7952 Secondary malignant neoplasm of bone marrow: Secondary | ICD-10-CM

## 2013-08-08 LAB — GLUCOSE, CAPILLARY: Glucose-Capillary: 74 mg/dL (ref 70–99)

## 2013-08-08 MED ORDER — FLUDEOXYGLUCOSE F - 18 (FDG) INJECTION
6.0000 | Freq: Once | INTRAVENOUS | Status: AC | PRN
Start: 2013-08-08 — End: 2013-08-08
  Administered 2013-08-08: 6 via INTRAVENOUS

## 2013-08-08 NOTE — Telephone Encounter (Signed)
Apolonio Schneiders called and asked if Magalene can get a refill on Symbicort.  She said Dr. Edrick Oh had originally filled it and she is contact with his nurse but has not heard back yet.  Apolonio Schneiders said "that Dr. Edrick Oh did not want to see Rachael Wall anymore due to her seeing a specialist."  Advised Dr. Lisbeth Renshaw who is not able to refill the prescription.  Called Apolonio Schneiders back to let her know.  She verbalized agreement.

## 2013-08-10 NOTE — Progress Notes (Signed)
  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  The mediastinal adenopathy was treated to 35 Gy in 14 fractions of 2.5 Gy 2.  The brain resection site in the posterior fossa was treated to 30 Gy in 10 fractions of 3 Gy  Beams/energy:    1.  The mediastinal adenopathy was treated with anterior and posterior 15 MV X-ray beams covering the central mediastinum including the subcarinal nodes. 2.  The brain resection site in the posterior fossa was treated right and left posterior oblique fields using 6 MV X-Rays.  The fields covered most of the posterior fossa while sparing the cerebral cortices.  Narrative: The patient tolerated radiation treatment relatively well.     Plan: The patient has completed radiation treatment. The patient will return to radiation oncology clinic for routine followup in one month. I advised them to call or return sooner if they have any questions or concerns related to their recovery or treatment. ________________________________  Sheral Apley. Tammi Klippel, M.D.

## 2013-08-12 ENCOUNTER — Ambulatory Visit: Payer: Commercial Managed Care - HMO

## 2013-08-12 ENCOUNTER — Other Ambulatory Visit (HOSPITAL_BASED_OUTPATIENT_CLINIC_OR_DEPARTMENT_OTHER): Payer: Commercial Managed Care - HMO

## 2013-08-12 ENCOUNTER — Encounter (INDEPENDENT_AMBULATORY_CARE_PROVIDER_SITE_OTHER): Payer: Self-pay

## 2013-08-12 ENCOUNTER — Other Ambulatory Visit: Payer: Self-pay | Admitting: *Deleted

## 2013-08-12 ENCOUNTER — Other Ambulatory Visit: Payer: Self-pay | Admitting: Internal Medicine

## 2013-08-12 ENCOUNTER — Encounter: Payer: Self-pay | Admitting: Internal Medicine

## 2013-08-12 ENCOUNTER — Telehealth: Payer: Self-pay | Admitting: Internal Medicine

## 2013-08-12 ENCOUNTER — Ambulatory Visit (HOSPITAL_BASED_OUTPATIENT_CLINIC_OR_DEPARTMENT_OTHER): Payer: Commercial Managed Care - HMO | Admitting: Internal Medicine

## 2013-08-12 VITALS — BP 88/59 | HR 88 | Temp 97.8°F | Resp 18 | Ht 59.0 in | Wt 89.5 lb

## 2013-08-12 DIAGNOSIS — C787 Secondary malignant neoplasm of liver and intrahepatic bile duct: Secondary | ICD-10-CM

## 2013-08-12 DIAGNOSIS — C7931 Secondary malignant neoplasm of brain: Secondary | ICD-10-CM

## 2013-08-12 DIAGNOSIS — C3490 Malignant neoplasm of unspecified part of unspecified bronchus or lung: Secondary | ICD-10-CM

## 2013-08-12 DIAGNOSIS — C7800 Secondary malignant neoplasm of unspecified lung: Secondary | ICD-10-CM

## 2013-08-12 DIAGNOSIS — C3491 Malignant neoplasm of unspecified part of right bronchus or lung: Secondary | ICD-10-CM | POA: Insufficient documentation

## 2013-08-12 DIAGNOSIS — C50919 Malignant neoplasm of unspecified site of unspecified female breast: Secondary | ICD-10-CM

## 2013-08-12 DIAGNOSIS — N390 Urinary tract infection, site not specified: Secondary | ICD-10-CM

## 2013-08-12 DIAGNOSIS — C7949 Secondary malignant neoplasm of other parts of nervous system: Secondary | ICD-10-CM

## 2013-08-12 DIAGNOSIS — C7970 Secondary malignant neoplasm of unspecified adrenal gland: Secondary | ICD-10-CM

## 2013-08-12 DIAGNOSIS — C779 Secondary and unspecified malignant neoplasm of lymph node, unspecified: Secondary | ICD-10-CM

## 2013-08-12 LAB — URINALYSIS, MICROSCOPIC - CHCC
Bilirubin (Urine): NEGATIVE
GLUCOSE UR CHCC: NEGATIVE mg/dL
Ketones: NEGATIVE mg/dL
Nitrite: NEGATIVE
Protein: 100 mg/dL
RBC / HPF: UNDETERMINED (ref 0–2)
SPECIFIC GRAVITY, URINE: 1.025 (ref 1.003–1.035)
Urobilinogen, UR: 0.2 mg/dL (ref 0.2–1)
pH: 5 (ref 4.6–8.0)

## 2013-08-12 LAB — CBC WITH DIFFERENTIAL/PLATELET
BASO%: 0 % (ref 0.0–2.0)
BASOS ABS: 0 10*3/uL (ref 0.0–0.1)
EOS ABS: 0 10*3/uL (ref 0.0–0.5)
EOS%: 0.3 % (ref 0.0–7.0)
HEMATOCRIT: 35.4 % (ref 34.8–46.6)
HGB: 12.4 g/dL (ref 11.6–15.9)
LYMPH%: 4.3 % — AB (ref 14.0–49.7)
MCH: 29 pg (ref 25.1–34.0)
MCHC: 35 g/dL (ref 31.5–36.0)
MCV: 82.9 fL (ref 79.5–101.0)
MONO#: 0.4 10*3/uL (ref 0.1–0.9)
MONO%: 6.1 % (ref 0.0–14.0)
NEUT#: 5.4 10*3/uL (ref 1.5–6.5)
NEUT%: 89.3 % — AB (ref 38.4–76.8)
Platelets: 125 10*3/uL — ABNORMAL LOW (ref 145–400)
RBC: 4.27 10*6/uL (ref 3.70–5.45)
RDW: 16.8 % — ABNORMAL HIGH (ref 11.2–14.5)
WBC: 6.1 10*3/uL (ref 3.9–10.3)
lymph#: 0.3 10*3/uL — ABNORMAL LOW (ref 0.9–3.3)

## 2013-08-12 LAB — COMPREHENSIVE METABOLIC PANEL (CC13)
ALT: 106 U/L — ABNORMAL HIGH (ref 0–55)
AST: 66 U/L — ABNORMAL HIGH (ref 5–34)
Albumin: 2.7 g/dL — ABNORMAL LOW (ref 3.5–5.0)
Alkaline Phosphatase: 604 U/L — ABNORMAL HIGH (ref 40–150)
Anion Gap: 10 mEq/L (ref 3–11)
BILIRUBIN TOTAL: 0.59 mg/dL (ref 0.20–1.20)
BUN: 21.3 mg/dL (ref 7.0–26.0)
CO2: 21 meq/L — AB (ref 22–29)
CREATININE: 0.7 mg/dL (ref 0.6–1.1)
Calcium: 9.4 mg/dL (ref 8.4–10.4)
Chloride: 94 mEq/L — ABNORMAL LOW (ref 98–109)
GLUCOSE: 96 mg/dL (ref 70–140)
Potassium: 4.6 mEq/L (ref 3.5–5.1)
Sodium: 125 mEq/L — ABNORMAL LOW (ref 136–145)
Total Protein: 5.8 g/dL — ABNORMAL LOW (ref 6.4–8.3)

## 2013-08-12 MED ORDER — PROCHLORPERAZINE MALEATE 10 MG PO TABS: 10.0000 mg | ORAL_TABLET | Freq: Four times a day (QID) | ORAL | Status: AC | PRN

## 2013-08-12 MED ORDER — CIPROFLOXACIN HCL 500 MG PO TABS: 500.0000 mg | ORAL_TABLET | Freq: Two times a day (BID) | ORAL | Status: DC

## 2013-08-12 MED ORDER — ALLOPURINOL 100 MG PO TABS: 100.0000 mg | ORAL_TABLET | Freq: Two times a day (BID) | ORAL | Status: AC

## 2013-08-12 NOTE — Progress Notes (Signed)
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  PRIOR THERAPY: 1) Left suboccipital craniectomy for posterior resection of cerebellar tumor using microdissection on 07/07/2013. 2) status post palliative radiotherapy to the surgical resection cavity in the brain under the care of Dr. Tammi Klippel. 2) status post palliative radiotherapy to the right upper lobe lung mass for relief of SVC symptoms under the care of Dr. Tammi Klippel completed 08/06/2013.  CURRENT THERAPY: Systemic chemotherapy with carboplatin for AUC of 5 on day 1 and etoposide 100 mg/M2 on days 1, 2 and 3 with Neulasta support on day 4.   INTERVAL HISTORY: Rachael Wall 73 y.o. female returns to the clinic today for new patient visit accompanied by her daughter. She is a very pleasant 26 years old white female who was diagnosed with metastatic high-grade neuroendocrine carcinoma in March of 2015. Staging workup at that time showed metastatic disease involving the lung, mediastinal lymph nodes, liver, abdomen metastasis, pancreatic as well as bone and brain metastasis. She also has symptoms of SVC syndrome. The patient underwent left suboccipital craniotomy for resection of cerebellar tumor followed by palliative radiotherapy to the surgical cavity in addition to palliative radiotherapy to the right upper lobe lung mass for relief of the SVC symptoms. She is here today for evaluation and discussion of her treatment options. She still complaining of back pain as well as generalized fatigue and weakness. She had a recent PET scan performed on 08/08/2013 and she is here for discussion of her scan results and her treatment options.  FAMILY HISTORY: Brother died of lung cancer.  SOCIAL  HISTORY: The patient is single and has 3 children. She lives with her daughter Rachael Wall in [place]. She has a history of smoking but quit 2 months ago. No history of alcohol or drug abuse.   MEDICAL HISTORY: Past Medical History  Diagnosis Date  . Diverticulitis     ALLERGIES:  has No Known Allergies.  MEDICATIONS:  Current Outpatient Prescriptions  Medication Sig Dispense Refill  . budesonide-formoterol (SYMBICORT) 160-4.5 MCG/ACT inhaler Inhale 2 puffs into the lungs See admin instructions. Every 9 hours.      Marland Kitchen dexamethasone (DECADRON) 4 MG tablet Take 1 tablet (4 mg total) by mouth as directed. Take 4 mg q12hr, 5 days, then 2 mg q12hrs, 5 days, then 2 mg once daily 5 days, then stop.  60 tablet  2  . dexamethasone (DECADRON) 4 MG tablet Take 0.5 tablets (2 mg total) by mouth 2 (two) times daily.  90 tablet  0  . feeding supplement, ENSURE COMPLETE, (ENSURE COMPLETE) LIQD Take 237 mLs by mouth 2 (two) times daily between meals.  10 Bottle  0  . lactose free nutrition (BOOST PLUS) LIQD Take 237 mLs by mouth 3 (three) times daily between meals.  10 Can  0  . pantoprazole (PROTONIX) 40 MG tablet Take 40 mg by mouth 2 (two) times daily.      . protein supplement (RESOURCE BENEPROTEIN) POWD Take 6 g by mouth 3 (three) times daily with meals.  10 Can  0  . sucralfate (CARAFATE) 1 G tablet Take 1 tablet (1 g total) by mouth 4 (four) times daily -  with meals and at bedtime. 5 minutes before meal for esophagitis  90 tablet  3  . Wound Cleansers (RADIAPLEX EX) Apply topically.      . furosemide (LASIX) 20 MG tablet Take 1 tablet (20 mg total) by mouth daily. Every other day, if needed for ankle swelling.  20 tablet  2  . ondansetron (ZOFRAN) 4 MG tablet Take 1 tablet (4 mg total) by mouth every 6 (six) hours as needed for nausea.  20 tablet  0  . oxyCODONE-acetaminophen (PERCOCET/ROXICET) 5-325 MG per tablet Take 1-2 tablets by mouth every 6 (six) hours as needed for severe pain.  120  tablet  0   No current facility-administered medications for this visit.    SURGICAL HISTORY:  Past Surgical History  Procedure Laterality Date  . Suboccipital craniectomy cervical laminectomy N/A 07/07/2013    Procedure: Left suboccipital craniectomy for resection of tumor ;  Surgeon: Ophelia Charter, MD;  Location: [facility];  Service: Neurosurgery;  Laterality: N/A;    REVIEW OF SYSTEMS:  Constitutional: positive for anorexia, fatigue and weight loss Eyes: negative Ears, nose, mouth, throat, and face: negative Respiratory: positive for cough and dyspnea on exertion Cardiovascular: negative Gastrointestinal: negative Genitourinary:negative Integument/breast: negative Hematologic/lymphatic: negative Musculoskeletal:positive for back pain Neurological: negative Behavioral/Psych: negative Endocrine: negative Allergic/Immunologic: negative   PHYSICAL EXAMINATION: General appearance: alert, cooperative, fatigued and no distress Head: Normocephalic, without obvious abnormality, atraumatic Neck: no adenopathy, no JVD, supple, symmetrical, trachea midline and thyroid not enlarged, symmetric, no tenderness/mass/nodules Lymph nodes: Cervical, supraclavicular, and axillary nodes normal. Resp: clear to auscultation bilaterally Back: symmetric, no curvature. ROM normal. No CVA tenderness. Cardio: regular rate and rhythm, S1, S2 normal, no murmur, click, rub or gallop GI: soft, non-tender; bowel sounds normal; no masses,  no organomegaly Extremities: extremities normal, atraumatic, no cyanosis or edema Neurologic: Alert and oriented X 3, normal strength and tone. Normal symmetric reflexes. Normal coordination and gait  ECOG PERFORMANCE STATUS: 1 - Symptomatic but completely ambulatory  Blood pressure 88/59, pulse 88, temperature 0 F (-17.8 C), resp. rate 18, height 4\' 11"  (1.499 m), weight 89 lb 8 oz (40.597 kg), SpO2 91.00%.  LABORATORY DATA: Lab Results  Component Value Date     WBC 6.1 08/12/2013   HGB 12.4 08/12/2013   HCT 35.4 08/12/2013   MCV 82.9 08/12/2013   PLT 125* 08/12/2013      Chemistry      Component Value Date/Time   NA 129* 07/10/2013 0245   K 4.2 07/10/2013 0245   CL 93* 07/10/2013 0245   CO2 25 07/10/2013 0245   BUN 15 07/10/2013 0245   CREATININE 0.50 07/10/2013 0245      Component Value Date/Time   CALCIUM 8.9 07/10/2013 0245   ALKPHOS 260* 07/02/2013 1541   AST 85* 07/02/2013 1541   ALT 27 07/02/2013 1541   BILITOT 0.6 07/02/2013 1541       RADIOGRAPHIC STUDIES: Nm Pet Image Initial (pi) Skull Base To Thigh  08/08/2013   CLINICAL DATA:  Initial Treatment strategy for Lung cancer.  EXAM: NUCLEAR MEDICINE PET SKULL BASE TO THIGH  TECHNIQUE: Six mCi F-18 FDG was injected intravenously. Full-ring PET imaging was performed from the skull base to thigh after the radiotracer. CT data was obtained and used for attenuation correction and anatomic localization.  FASTING BLOOD GLUCOSE:  Value: 74 mg/dl  COMPARISON:  None.  FINDINGS: NECK  Increased FDG uptake is associated with posterior cervical lymph nodes. Index lymph node on the left has an SUV max equal to 4.7. Hypermetabolic bilateral supraclavicular lymph nodes are also present. Left  posterior supraclavicular lymph node has an SUV max equal to 12.1.  CHEST  Multi lobulated mass in the right lower lobe is identified compatible with primary lung neoplasm. This measures approximately 2.3 cm and has an SUV max equal to 12.9. Subpleural mass within the posterior right lower lobe measure 5.1 and has an SUV max equal to 16. Multiple smaller hypermetabolic nodules are scattered within the right lower lobe and likely indicate transbronchial spread of tumor. Hypermetabolic bilateral mediastinal lymph node metastasis are identified. Index right paratracheal lymph node measures 3.4 cm and has an SUV max equal to 4.4.  ABDOMEN/PELVIS  Large, multi focal liver metastasis are identified. Index mass is in the inferior right  hepatic lobe measuring 8.4 cm. The SUV max is equal to 16. Multiple hypermetabolic soft tissue nodules are scattered throughout the abdominal and pelvic cavity. Index nodule within the left pericolic gutter measures 2.1 cm and has an SUV max equal to 16.3. Right pelvic sidewall nodule has an SUV max equal to 5.8. Hypermetabolic upper abdominal lymph nodes are identified. Gastrohepatic ligament lymph node measures 1.2 cm and has an SUV max equal to 18.8. Multiple hypermetabolic lesions are identified within the tail of pancreas. Index lesion has an SUV max equal to 10.2 advanced calcified atherosclerotic disease affects the abdominal aorta. There is no aneurysm.  SKELETON  Widespread metastatic disease is identified within the subcutaneous soft tissues of the body wall. Index nodule within the right ventral chest wall has an SUV max equal to 6.3. Within the right buttock region has an SUV max equal to 10.1. Hypermetabolic bone metastasis are identified. Lesion within the proximal right humerus has an SUV max equal to 4.6, image 17. Lesion within the posterior aspect of the right second rib has an SUV max equal to 8.1.  IMPRESSION: 1. Findings are consistent with widespread metastatic primary bronchogenic carcinoma. The primary lung neoplasm is likely in the right lower lobe. 2. Hypermetabolic, bilateral mediastinal lymph node metastasis. 3. Extensive liver metastasis 4. Multi focal bone metastasis 5. Extensive soft tissue metastasis to the body wall. 6. Multi focal hypermetabolic lesions throughout the abdominal and pelvic cavity. 7. Suspect pancreas metastasis.   Electronically Signed   By: Kerby Moors M.D.   On: 08/08/2013 15:30    ASSESSMENT AND PLAN: This is a very pleasant 73 years old white female recently diagnosed with metastatic poorly differentiated high-grade neuroendocrine carcinoma, large cell type with significant disease progression in the chest, abdomen, liver, bone, pancreas and brain. She  underwent resection of a cerebellar brain lesion followed by palliative radiotherapy to the resection cavity in addition to palliative radiotherapy to the right upper lobe hilar mass. I have a lengthy discussion with the patient and her daughter today about her current disease stage, prognosis and treatment options. I gave the patient the option of palliative care and hospice referral versus consideration of systemic chemotherapy with a small cell regimen because of the poorly differentiated neuroendocrine carcinoma. The patient is interested in systemic chemotherapy. I recommended for her regimen consisting of carboplatin for AUC of 5 on day 1 and etoposide 100 mg/M2 on days 1, 2 and 3 with Neulasta support on day 4. I discussed with the patient adverse effect of the chemotherapy including but not limited to alopecia, myelosuppression, nausea and vomiting, peripheral neuropathy, liver or renal dysfunction. I will arrange for the patient to have a chemotherapy education class before starting the first cycle of her treatment. I will Escribe Compazine 10 mg by mouth every  6 hours as needed for nausea in addition to allopurinol 100 mg by mouth twice a day for tumor lysis prophylaxis. She is expected to start the first cycle of her treatment early next week. The patient would come back for followup visit in 2 weeks for reevaluation and management any adverse effect of her treatment. She was advised to call immediately if she has any concerning symptoms in the interval.  The patient voices understanding of current disease status and treatment options and is in agreement with the current care plan.  All questions were answered. The patient knows to call the clinic with any problems, questions or concerns. We can certainly see the patient much sooner if necessary.  I spent 35 minutes counseling the patient face to face. The total time spent in the appointment was 45 minutes.  Disclaimer: This note was  dictated with voice recognition software. Similar sounding words can inadvertently be transcribed and may not be corrected upon review.

## 2013-08-12 NOTE — Progress Notes (Signed)
[facility] Note:  Call patient with the result. i called her Pharmacy with RX for cipro 500 mg bid X 7 days. ______

## 2013-08-12 NOTE — Progress Notes (Signed)
Checked in new pt with no financial concerns. °

## 2013-08-12 NOTE — Telephone Encounter (Signed)
gv dn printd appt sched and avs for pt for April and May....sed added tx.

## 2013-08-13 ENCOUNTER — Telehealth: Payer: Self-pay | Admitting: *Deleted

## 2013-08-13 NOTE — Telephone Encounter (Signed)
Message copied by Norma Fredrickson on Wed Aug 13, 2013  9:50 AM ------      Message from: Britt Bottom      Created: Wed Aug 13, 2013  8:59 AM                   ----- Message -----         From: Curt Bears, MD         Sent: 08/12/2013   6:52 PM           To: Carlton Adam, PA-C, #            Call patient with the result. i called her Pharmacy with RX for cipro 500 mg bid X 7 days. ------

## 2013-08-13 NOTE — Telephone Encounter (Signed)
Left message for patient to call back for results.  

## 2013-08-14 ENCOUNTER — Telehealth: Payer: Self-pay | Admitting: *Deleted

## 2013-08-14 NOTE — Telephone Encounter (Signed)
Message copied by Norma Fredrickson on Thu Aug 14, 2013 12:47 PM ------      Message from: Britt Bottom      Created: Wed Aug 13, 2013  8:59 AM                   ----- Message -----         From: Curt Bears, MD         Sent: 08/12/2013   6:52 PM           To: Carlton Adam, PA-C, #            Call patient with the result. i called her Pharmacy with RX for cipro 500 mg bid X 7 days. ------

## 2013-08-14 NOTE — Telephone Encounter (Signed)
Called patient with lab results and informed a prescription has been sent to her pharmacy.  Per Dr. Julien Nordmann.  Patient verbalized understanding.

## 2013-08-16 LAB — URINE CULTURE

## 2013-08-18 ENCOUNTER — Other Ambulatory Visit: Payer: Commercial Managed Care - HMO

## 2013-08-18 NOTE — Progress Notes (Signed)
  Radiation Oncology         [phone] ________________________________  Name: Rachael Wall  MRN: [id number]  Date: 07/21/2013  DOB: [birth year]  VIRTUAL SIMULATION NOTE  NARRATIVE:  The patient underwent simulation today for ongoing radiation therapy.  The existing CT study set was employed for the purpose of virtual treatment planning.  The target and avoidance structures were reviewed and in some cases modified.  Treatment planning then occurred.  The radiation boost prescription was entered and confirmed.  A total of 2 complex treatment devices were fabricated in the form of multi-leaf collimators to shape radiation around the targets while maximally excluding nearby normal structures. I have requested : Isodose Plan.   PLAN:  This modified radiation beam arrangement is intended to add treatment to the posterior fossa following brain met resection to an additional 30 Gy in 10 fractions while the patient continues chest radiation.  ------------------------------------------------  Sheral Apley Tammi Klippel, M.D.

## 2013-08-19 ENCOUNTER — Other Ambulatory Visit: Payer: Self-pay | Admitting: Internal Medicine

## 2013-08-19 ENCOUNTER — Other Ambulatory Visit (HOSPITAL_BASED_OUTPATIENT_CLINIC_OR_DEPARTMENT_OTHER): Payer: Commercial Managed Care - HMO

## 2013-08-19 ENCOUNTER — Ambulatory Visit (HOSPITAL_BASED_OUTPATIENT_CLINIC_OR_DEPARTMENT_OTHER): Payer: Commercial Managed Care - HMO

## 2013-08-19 VITALS — BP 96/68 | HR 133 | Resp 17

## 2013-08-19 DIAGNOSIS — C3491 Malignant neoplasm of unspecified part of right bronchus or lung: Secondary | ICD-10-CM

## 2013-08-19 DIAGNOSIS — C787 Secondary malignant neoplasm of liver and intrahepatic bile duct: Secondary | ICD-10-CM

## 2013-08-19 DIAGNOSIS — C7800 Secondary malignant neoplasm of unspecified lung: Secondary | ICD-10-CM

## 2013-08-19 DIAGNOSIS — N390 Urinary tract infection, site not specified: Secondary | ICD-10-CM

## 2013-08-19 DIAGNOSIS — C3490 Malignant neoplasm of unspecified part of unspecified bronchus or lung: Secondary | ICD-10-CM

## 2013-08-19 DIAGNOSIS — Z5111 Encounter for antineoplastic chemotherapy: Secondary | ICD-10-CM

## 2013-08-19 LAB — CBC WITH DIFFERENTIAL/PLATELET
BASO%: 0 % (ref 0.0–2.0)
Basophils Absolute: 0 10*3/uL (ref 0.0–0.1)
EOS ABS: 0 10*3/uL (ref 0.0–0.5)
EOS%: 0.6 % (ref 0.0–7.0)
HCT: 30.9 % — ABNORMAL LOW (ref 34.8–46.6)
HGB: 10.8 g/dL — ABNORMAL LOW (ref 11.6–15.9)
LYMPH%: 3.9 % — AB (ref 14.0–49.7)
MCH: 29.1 pg (ref 25.1–34.0)
MCHC: 35 g/dL (ref 31.5–36.0)
MCV: 83.3 fL (ref 79.5–101.0)
MONO#: 0.6 10*3/uL (ref 0.1–0.9)
MONO%: 9.1 % (ref 0.0–14.0)
NEUT%: 86.4 % — ABNORMAL HIGH (ref 38.4–76.8)
NEUTROS ABS: 5.6 10*3/uL (ref 1.5–6.5)
NRBC: 0 % (ref 0–0)
Platelets: 210 10*3/uL (ref 145–400)
RBC: 3.71 10*6/uL (ref 3.70–5.45)
RDW: 17.6 % — ABNORMAL HIGH (ref 11.2–14.5)
WBC: 6.5 10*3/uL (ref 3.9–10.3)
lymph#: 0.3 10*3/uL — ABNORMAL LOW (ref 0.9–3.3)

## 2013-08-19 LAB — COMPREHENSIVE METABOLIC PANEL (CC13)
ALT: 69 U/L — ABNORMAL HIGH (ref 0–55)
ANION GAP: 10 meq/L (ref 3–11)
AST: 58 U/L — ABNORMAL HIGH (ref 5–34)
Albumin: 2.5 g/dL — ABNORMAL LOW (ref 3.5–5.0)
Alkaline Phosphatase: 757 U/L — ABNORMAL HIGH (ref 40–150)
BUN: 19.8 mg/dL (ref 7.0–26.0)
CALCIUM: 9.5 mg/dL (ref 8.4–10.4)
CO2: 18 meq/L — AB (ref 22–29)
CREATININE: 0.7 mg/dL (ref 0.6–1.1)
Chloride: 101 mEq/L (ref 98–109)
Glucose: 105 mg/dl (ref 70–140)
Potassium: 4 mEq/L (ref 3.5–5.1)
Sodium: 129 mEq/L — ABNORMAL LOW (ref 136–145)
Total Bilirubin: 0.96 mg/dL (ref 0.20–1.20)
Total Protein: 5.7 g/dL — ABNORMAL LOW (ref 6.4–8.3)

## 2013-08-19 LAB — URIC ACID (CC13): URIC ACID, SERUM: 6.2 mg/dL (ref 2.6–7.4)

## 2013-08-19 MED ORDER — ONDANSETRON 16 MG/50ML IVPB (CHCC)
INTRAVENOUS | Status: AC
  Filled 2013-08-19: qty 16

## 2013-08-19 MED ORDER — DEXAMETHASONE SODIUM PHOSPHATE 20 MG/5ML IJ SOLN
INTRAMUSCULAR | Status: AC
  Filled 2013-08-19: qty 5

## 2013-08-19 MED ORDER — SODIUM CHLORIDE 0.9 % IV SOLN
100.0000 mg/m2 | Freq: Once | INTRAVENOUS | Status: DC
  Filled 2013-08-19: qty 6.5

## 2013-08-19 MED ORDER — ETOPOSIDE CHEMO INJECTION 1 GM/50ML
80.0000 mg/m2 | Freq: Once | INTRAVENOUS | Status: AC
  Administered 2013-08-19: 100 mg via INTRAVENOUS
  Filled 2013-08-19: qty 5

## 2013-08-19 MED ORDER — SODIUM CHLORIDE 0.9 % IV SOLN
283.0000 mg | Freq: Once | INTRAVENOUS | Status: AC
  Administered 2013-08-19: 280 mg via INTRAVENOUS
  Filled 2013-08-19: qty 28

## 2013-08-19 MED ORDER — DEXAMETHASONE SODIUM PHOSPHATE 20 MG/5ML IJ SOLN
20.0000 mg | Freq: Once | INTRAMUSCULAR | Status: AC
  Administered 2013-08-19: 20 mg via INTRAVENOUS

## 2013-08-19 MED ORDER — ONDANSETRON 16 MG/50ML IVPB (CHCC)
16.0000 mg | Freq: Once | INTRAVENOUS | Status: AC
  Administered 2013-08-19: 16 mg via INTRAVENOUS

## 2013-08-19 MED ORDER — SODIUM CHLORIDE 0.9 % IV SOLN
Freq: Once | INTRAVENOUS | Status: AC
  Administered 2013-08-19: 10:00:00 via INTRAVENOUS

## 2013-08-19 NOTE — Patient Instructions (Signed)
[facility] Discharge Instructions for Patients Receiving Chemotherapy  Today you received the following chemotherapy agents: carboplatin, etoposide  To help prevent nausea and vomiting after your treatment, we encourage you to take your nausea medication.  Take it as often as prescribed.     If you develop nausea and vomiting that is not controlled by your nausea medication, call the clinic. If it is after clinic hours your family physician or the after hours number for the clinic or go to the Emergency Department.   BELOW ARE SYMPTOMS THAT SHOULD BE REPORTED IMMEDIATELY:  *FEVER GREATER THAN 100.5 F  *CHILLS WITH OR WITHOUT FEVER  NAUSEA AND VOMITING THAT IS NOT CONTROLLED WITH YOUR NAUSEA MEDICATION  *UNUSUAL SHORTNESS OF BREATH  *UNUSUAL BRUISING OR BLEEDING  TENDERNESS IN MOUTH AND THROAT WITH OR WITHOUT PRESENCE OF ULCERS  *URINARY PROBLEMS  *BOWEL PROBLEMS  UNUSUAL RASH Items with * indicate a potential emergency and should be followed up as soon as possible.  Feel free to call the clinic you have any questions or concerns. The clinic phone number is [phone].   I have been informed and understand all the instructions given to me. I know to contact the clinic, my physician, or go to the Emergency Department if any problems should occur. I do not have any questions at this time, but understand that I may call the clinic during office hours   should I have any questions or need assistance in obtaining follow up care.    __________________________________________  _____________  __________ Signature of Patient or Authorized Representative            Date                   Time    __________________________________________ Nurse's Signature    Carboplatin injection What is this medicine? CARBOPLATIN (KAR boe pla tin) is a chemotherapy drug. It targets fast dividing cells, like cancer cells, and causes these cells to die. This medicine is used  to treat ovarian cancer and many other cancers. This medicine may be used for other purposes; ask your health care provider or pharmacist if you have questions. COMMON BRAND NAME(S): Paraplatin What should I tell my health care provider before I take this medicine? They need to know if you have any of these conditions: -blood disorders -hearing problems -kidney disease -recent or ongoing radiation therapy -an unusual or allergic reaction to carboplatin, cisplatin, other chemotherapy, other medicines, foods, dyes, or preservatives -pregnant or trying to get pregnant -breast-feeding How should I use this medicine? This drug is usually given as an infusion into a vein. It is administered in a hospital or clinic by a specially trained health care professional. Talk to your pediatrician regarding the use of this medicine in children. Special care may be needed. Overdosage: If you think you have taken too much of this medicine contact a poison control center or emergency room at once. NOTE: This medicine is only for you. Do not share this medicine with others. What if I miss a dose? It is important not to miss a dose. Call your doctor or health care professional if you are unable to keep an appointment. What may interact with this medicine? -medicines for seizures -medicines to increase blood counts like filgrastim, pegfilgrastim, sargramostim -some antibiotics like amikacin, gentamicin, neomycin, streptomycin, tobramycin -vaccines Talk to your doctor or health care professional before taking any of these medicines: -acetaminophen -aspirin -ibuprofen -ketoprofen -naproxen This list may not describe  all possible interactions. Give your health care provider a list of all the medicines, herbs, non-prescription drugs, or dietary supplements you use. Also tell them if you smoke, drink alcohol, or use illegal drugs. Some items may interact with your medicine. What should I watch for while using  this medicine? Your condition will be monitored carefully while you are receiving this medicine. You will need important blood work done while you are taking this medicine. This drug may make you feel generally unwell. This is not uncommon, as chemotherapy can affect healthy cells as well as cancer cells. Report any side effects. Continue your course of treatment even though you feel ill unless your doctor tells you to stop. In some cases, you may be given additional medicines to help with side effects. Follow all directions for their use. Call your doctor or health care professional for advice if you get a fever, chills or sore throat, or other symptoms of a cold or flu. Do not treat yourself. This drug decreases your body's ability to fight infections. Try to avoid being around people who are sick. This medicine may increase your risk to bruise or bleed. Call your doctor or health care professional if you notice any unusual bleeding. Be careful brushing and flossing your teeth or using a toothpick because you may get an infection or bleed more easily. If you have any dental work done, tell your dentist you are receiving this medicine. Avoid taking products that contain aspirin, acetaminophen, ibuprofen, naproxen, or ketoprofen unless instructed by your doctor. These medicines may hide a fever. Do not become pregnant while taking this medicine. Women should inform their doctor if they wish to become pregnant or think they might be pregnant. There is a potential for serious side effects to an unborn child. Talk to your health care professional or pharmacist for more information. Do not breast-feed an infant while taking this medicine. What side effects may I notice from receiving this medicine? Side effects that you should report to your doctor or health care professional as soon as possible: -allergic reactions like skin rash, itching or hives, swelling of the face, lips, or tongue -signs of infection -  fever or chills, cough, sore throat, pain or difficulty passing urine -signs of decreased platelets or bleeding - bruising, pinpoint red spots on the skin, black, tarry stools, nosebleeds -signs of decreased red blood cells - unusually weak or tired, fainting spells, lightheadedness -breathing problems -changes in hearing -changes in vision -chest pain -high blood pressure -low blood counts - This drug may decrease the number of white blood cells, red blood cells and platelets. You may be at increased risk for infections and bleeding. -nausea and vomiting -pain, swelling, redness or irritation at the injection site -pain, tingling, numbness in the hands or feet -problems with balance, talking, walking -trouble passing urine or change in the amount of urine Side effects that usually do not require medical attention (report to your doctor or health care professional if they continue or are bothersome): -hair loss -loss of appetite -metallic taste in the mouth or changes in taste This list may not describe all possible side effects. Call your doctor for medical advice about side effects. You may report side effects to FDA at 1-800-FDA-1088. Where should I keep my medicine? This drug is given in a hospital or clinic and will not be stored at home. NOTE: This sheet is a summary. It may not cover all possible information. If you have questions about this medicine, talk  to your doctor, pharmacist, or health care provider.  2014, Elsevier/Gold Standard. (2007-07-16 14:38:05)   Etoposide, VP-16 injection What is this medicine? ETOPOSIDE, VP-16 (e toe POE side) is a chemotherapy drug. It is used to treat testicular cancer, lung cancer, and other cancers. This medicine may be used for other purposes; ask your health care provider or pharmacist if you have questions. COMMON BRAND NAME(S): Etopophos, Toposar, VePesid What should I tell my health care provider before I take this medicine? They need  to know if you have any of these conditions: -infection -kidney disease -low blood counts, like low white cell, platelet, or red cell counts -an unusual or allergic reaction to etoposide, other chemotherapeutic agents, other medicines, foods, dyes, or preservatives -pregnant or trying to get pregnant -breast-feeding How should I use this medicine? This medicine is for infusion into a vein. It is administered in a hospital or clinic by a specially trained health care professional. Talk to your pediatrician regarding the use of this medicine in children. Special care may be needed. Overdosage: If you think you have taken too much of this medicine contact a poison control center or emergency room at once. NOTE: This medicine is only for you. Do not share this medicine with others. What if I miss a dose? It is important not to miss your dose. Call your doctor or health care professional if you are unable to keep an appointment. What may interact with this medicine? -cyclosporine -medicines to increase blood counts like filgrastim, pegfilgrastim, sargramostim -vaccines This list may not describe all possible interactions. Give your health care provider a list of all the medicines, herbs, non-prescription drugs, or dietary supplements you use. Also tell them if you smoke, drink alcohol, or use illegal drugs. Some items may interact with your medicine. What should I watch for while using this medicine? Visit your doctor for checks on your progress. This drug may make you feel generally unwell. This is not uncommon, as chemotherapy can affect healthy cells as well as cancer cells. Report any side effects. Continue your course of treatment even though you feel ill unless your doctor tells you to stop. In some cases, you may be given additional medicines to help with side effects. Follow all directions for their use. Call your doctor or health care professional for advice if you get a fever, chills or  sore throat, or other symptoms of a cold or flu. Do not treat yourself. This drug decreases your body's ability to fight infections. Try to avoid being around people who are sick. This medicine may increase your risk to bruise or bleed. Call your doctor or health care professional if you notice any unusual bleeding. Be careful brushing and flossing your teeth or using a toothpick because you may get an infection or bleed more easily. If you have any dental work done, tell your dentist you are receiving this medicine. Avoid taking products that contain aspirin, acetaminophen, ibuprofen, naproxen, or ketoprofen unless instructed by your doctor. These medicines may hide a fever. Do not become pregnant while taking this medicine. Women should inform their doctor if they wish to become pregnant or think they might be pregnant. There is a potential for serious side effects to an unborn child. Talk to your health care professional or pharmacist for more information. Do not breast-feed an infant while taking this medicine. What side effects may I notice from receiving this medicine? Side effects that you should report to your doctor or health care professional as soon  as possible: -allergic reactions like skin rash, itching or hives, swelling of the face, lips, or tongue -low blood counts - this medicine may decrease the number of white blood cells, red blood cells and platelets. You may be at increased risk for infections and bleeding. -signs of infection - fever or chills, cough, sore throat, pain or difficulty passing urine -signs of decreased platelets or bleeding - bruising, pinpoint red spots on the skin, black, tarry stools, blood in the urine -signs of decreased red blood cells - unusually weak or tired, fainting spells, lightheadedness -breathing problems -changes in vision -mouth or throat sores or ulcers -pain, redness, swelling or irritation at the injection site -pain, tingling, numbness in the  hands or feet -redness, blistering, peeling or loosening of the skin, including inside the mouth -seizures -vomiting Side effects that usually do not require medical attention (report to your doctor or health care professional if they continue or are bothersome): -diarrhea -hair loss -loss of appetite -nausea -stomach pain This list may not describe all possible side effects. Call your doctor for medical advice about side effects. You may report side effects to FDA at 1-800-FDA-1088. Where should I keep my medicine? This drug is given in a hospital or clinic and will not be stored at home. NOTE: This sheet is a summary. It may not cover all possible information. If you have questions about this medicine, talk to your doctor, pharmacist, or health care provider.  2014, Elsevier/Gold Standard. (2007-08-12 17:24:12)

## 2013-08-19 NOTE — Progress Notes (Signed)
OK to treat with CMET lab values of today per Dr. Julien Nordmann.

## 2013-08-19 NOTE — Progress Notes (Signed)
Per Dr Julien Nordmann it is okay to treat pt with chemo today and cmet from today . Pharmacy consulted regarding dose reduction .

## 2013-08-20 ENCOUNTER — Ambulatory Visit (HOSPITAL_BASED_OUTPATIENT_CLINIC_OR_DEPARTMENT_OTHER): Payer: Commercial Managed Care - HMO

## 2013-08-20 ENCOUNTER — Other Ambulatory Visit: Payer: Self-pay | Admitting: *Deleted

## 2013-08-20 VITALS — BP 91/59 | HR 118 | Temp 97.6°F

## 2013-08-20 DIAGNOSIS — Z5111 Encounter for antineoplastic chemotherapy: Secondary | ICD-10-CM

## 2013-08-20 DIAGNOSIS — C7800 Secondary malignant neoplasm of unspecified lung: Secondary | ICD-10-CM

## 2013-08-20 DIAGNOSIS — C787 Secondary malignant neoplasm of liver and intrahepatic bile duct: Secondary | ICD-10-CM

## 2013-08-20 DIAGNOSIS — C3490 Malignant neoplasm of unspecified part of unspecified bronchus or lung: Secondary | ICD-10-CM

## 2013-08-20 DIAGNOSIS — C3491 Malignant neoplasm of unspecified part of right bronchus or lung: Secondary | ICD-10-CM

## 2013-08-20 MED ORDER — PROCHLORPERAZINE MALEATE 10 MG PO TABS
ORAL_TABLET | ORAL | Status: AC
  Filled 2013-08-20: qty 1

## 2013-08-20 MED ORDER — PROCHLORPERAZINE MALEATE 10 MG PO TABS
10.0000 mg | ORAL_TABLET | Freq: Once | ORAL | Status: AC
  Administered 2013-08-20: 10 mg via ORAL

## 2013-08-20 MED ORDER — SODIUM CHLORIDE 0.9 % IV SOLN
Freq: Once | INTRAVENOUS | Status: AC
  Administered 2013-08-20: 09:00:00 via INTRAVENOUS

## 2013-08-20 MED ORDER — ETOPOSIDE CHEMO INJECTION 1 GM/50ML
80.0000 mg/m2 | Freq: Once | INTRAVENOUS | Status: AC
  Administered 2013-08-20: 100 mg via INTRAVENOUS
  Filled 2013-08-20: qty 5

## 2013-08-20 NOTE — Patient Instructions (Signed)
[facility] Discharge Instructions for Patients Receiving Chemotherapy  Today you received the following chemotherapy agents etoposide.    To help prevent nausea and vomiting after your treatment, we encourage you to take your nausea medication as directed.     If you develop nausea and vomiting that is not controlled by your nausea medication, call the clinic.   BELOW ARE SYMPTOMS THAT SHOULD BE REPORTED IMMEDIATELY:  *FEVER GREATER THAN 100.5 F  *CHILLS WITH OR WITHOUT FEVER  NAUSEA AND VOMITING THAT IS NOT CONTROLLED WITH YOUR NAUSEA MEDICATION  *UNUSUAL SHORTNESS OF BREATH  *UNUSUAL BRUISING OR BLEEDING  TENDERNESS IN MOUTH AND THROAT WITH OR WITHOUT PRESENCE OF ULCERS  *URINARY PROBLEMS  *BOWEL PROBLEMS  UNUSUAL RASH Items with * indicate a potential emergency and should be followed up as soon as possible.  Feel free to call the clinic you have any questions or concerns. The clinic phone number is [phone].

## 2013-08-21 ENCOUNTER — Ambulatory Visit (HOSPITAL_BASED_OUTPATIENT_CLINIC_OR_DEPARTMENT_OTHER): Payer: Commercial Managed Care - HMO

## 2013-08-21 VITALS — BP 105/65 | HR 74 | Temp 94.5°F | Resp 18

## 2013-08-21 DIAGNOSIS — C50919 Malignant neoplasm of unspecified site of unspecified female breast: Secondary | ICD-10-CM

## 2013-08-21 DIAGNOSIS — Z5111 Encounter for antineoplastic chemotherapy: Secondary | ICD-10-CM

## 2013-08-21 DIAGNOSIS — C3491 Malignant neoplasm of unspecified part of right bronchus or lung: Secondary | ICD-10-CM

## 2013-08-21 DIAGNOSIS — C787 Secondary malignant neoplasm of liver and intrahepatic bile duct: Secondary | ICD-10-CM

## 2013-08-21 DIAGNOSIS — C779 Secondary and unspecified malignant neoplasm of lymph node, unspecified: Secondary | ICD-10-CM

## 2013-08-21 DIAGNOSIS — C7970 Secondary malignant neoplasm of unspecified adrenal gland: Secondary | ICD-10-CM

## 2013-08-21 DIAGNOSIS — C7800 Secondary malignant neoplasm of unspecified lung: Secondary | ICD-10-CM

## 2013-08-21 DIAGNOSIS — C3490 Malignant neoplasm of unspecified part of unspecified bronchus or lung: Secondary | ICD-10-CM

## 2013-08-21 MED ORDER — PROCHLORPERAZINE MALEATE 10 MG PO TABS
10.0000 mg | ORAL_TABLET | Freq: Once | ORAL | Status: AC
  Administered 2013-08-21: 10 mg via ORAL

## 2013-08-21 MED ORDER — SODIUM CHLORIDE 0.9 % IV SOLN
80.0000 mg/m2 | Freq: Once | INTRAVENOUS | Status: AC
  Administered 2013-08-21: 100 mg via INTRAVENOUS
  Filled 2013-08-21: qty 5

## 2013-08-21 MED ORDER — PROCHLORPERAZINE MALEATE 10 MG PO TABS
ORAL_TABLET | ORAL | Status: AC
  Filled 2013-08-21: qty 1

## 2013-08-21 MED ORDER — SODIUM CHLORIDE 0.9 % IV SOLN
Freq: Once | INTRAVENOUS | Status: AC
  Administered 2013-08-21: 09:00:00 via INTRAVENOUS

## 2013-08-21 NOTE — Patient Instructions (Signed)
[facility] Discharge Instructions for Patients Receiving Chemotherapy  Today you received the following chemotherapy agents:  Etoposide  To help prevent nausea and vomiting after your treatment, we encourage you to take your nausea medication as ordered per MD.   If you develop nausea and vomiting that is not controlled by your nausea medication, call the clinic.   BELOW ARE SYMPTOMS THAT SHOULD BE REPORTED IMMEDIATELY:  *FEVER GREATER THAN 100.5 F  *CHILLS WITH OR WITHOUT FEVER  NAUSEA AND VOMITING THAT IS NOT CONTROLLED WITH YOUR NAUSEA MEDICATION  *UNUSUAL SHORTNESS OF BREATH  *UNUSUAL BRUISING OR BLEEDING  TENDERNESS IN MOUTH AND THROAT WITH OR WITHOUT PRESENCE OF ULCERS  *URINARY PROBLEMS  *BOWEL PROBLEMS  UNUSUAL RASH Items with * indicate a potential emergency and should be followed up as soon as possible.  Feel free to call the clinic you have any questions or concerns. The clinic phone number is [phone].

## 2013-08-22 ENCOUNTER — Ambulatory Visit (HOSPITAL_BASED_OUTPATIENT_CLINIC_OR_DEPARTMENT_OTHER): Payer: Commercial Managed Care - HMO

## 2013-08-22 ENCOUNTER — Telehealth: Payer: Self-pay | Admitting: *Deleted

## 2013-08-22 VITALS — BP 94/57 | HR 17 | Temp 95.7°F

## 2013-08-22 DIAGNOSIS — C3491 Malignant neoplasm of unspecified part of right bronchus or lung: Secondary | ICD-10-CM

## 2013-08-22 DIAGNOSIS — C7800 Secondary malignant neoplasm of unspecified lung: Secondary | ICD-10-CM

## 2013-08-22 DIAGNOSIS — C3490 Malignant neoplasm of unspecified part of unspecified bronchus or lung: Secondary | ICD-10-CM

## 2013-08-22 DIAGNOSIS — Z5189 Encounter for other specified aftercare: Secondary | ICD-10-CM

## 2013-08-22 MED ORDER — PEGFILGRASTIM INJECTION 6 MG/0.6ML
6.0000 mg | Freq: Once | SUBCUTANEOUS | Status: AC
  Administered 2013-08-22: 6 mg via SUBCUTANEOUS
  Filled 2013-08-22: qty 0.6

## 2013-08-22 NOTE — Patient Instructions (Signed)

## 2013-08-22 NOTE — Telephone Encounter (Signed)
Mrs Ocanas here for Neulasta injection following 1st carbo/vp chemo treatment, accompanied by daughter.  States that she is just tired.  No nausea, vomiting or diarrhea.  Is drinking some fluids and knows that she needs to drink more.  All questions answered.  Knows to call if she has any problems or concerns.

## 2013-08-26 ENCOUNTER — Encounter: Payer: Self-pay | Admitting: Internal Medicine

## 2013-08-26 ENCOUNTER — Non-Acute Institutional Stay (HOSPITAL_COMMUNITY)
Admission: AD | Admit: 2013-08-26 | Discharge: 2013-08-26 | Disposition: A | Discharge status: Home / Self Care | Payer: Commercial Managed Care - HMO | Source: Ambulatory Visit | Attending: Internal Medicine | Admitting: Internal Medicine

## 2013-08-26 ENCOUNTER — Ambulatory Visit (HOSPITAL_BASED_OUTPATIENT_CLINIC_OR_DEPARTMENT_OTHER): Payer: Commercial Managed Care - HMO | Admitting: Internal Medicine

## 2013-08-26 ENCOUNTER — Telehealth: Payer: Self-pay | Admitting: Internal Medicine

## 2013-08-26 ENCOUNTER — Other Ambulatory Visit (HOSPITAL_BASED_OUTPATIENT_CLINIC_OR_DEPARTMENT_OTHER): Payer: Commercial Managed Care - HMO

## 2013-08-26 ENCOUNTER — Telehealth: Payer: Self-pay | Admitting: *Deleted

## 2013-08-26 ENCOUNTER — Ambulatory Visit: Payer: Medicare HMO | Admitting: Physician Assistant

## 2013-08-26 VITALS — BP 84/55 | HR 139 | Temp 98.3°F | Resp 16 | Ht 59.0 in | Wt 86.2 lb

## 2013-08-26 DIAGNOSIS — C7A1 Malignant poorly differentiated neuroendocrine tumors: Secondary | ICD-10-CM

## 2013-08-26 DIAGNOSIS — R000 Tachycardia, unspecified: Secondary | ICD-10-CM

## 2013-08-26 DIAGNOSIS — R5383 Other fatigue: Secondary | ICD-10-CM

## 2013-08-26 DIAGNOSIS — C787 Secondary malignant neoplasm of liver and intrahepatic bile duct: Secondary | ICD-10-CM

## 2013-08-26 DIAGNOSIS — E860 Dehydration: Secondary | ICD-10-CM

## 2013-08-26 DIAGNOSIS — N390 Urinary tract infection, site not specified: Secondary | ICD-10-CM

## 2013-08-26 DIAGNOSIS — C7B8 Other secondary neuroendocrine tumors: Secondary | ICD-10-CM

## 2013-08-26 DIAGNOSIS — R5381 Other malaise: Secondary | ICD-10-CM

## 2013-08-26 DIAGNOSIS — R0989 Other specified symptoms and signs involving the circulatory and respiratory systems: Secondary | ICD-10-CM

## 2013-08-26 DIAGNOSIS — D709 Neutropenia, unspecified: Secondary | ICD-10-CM

## 2013-08-26 DIAGNOSIS — C3491 Malignant neoplasm of unspecified part of right bronchus or lung: Secondary | ICD-10-CM

## 2013-08-26 DIAGNOSIS — R0609 Other forms of dyspnea: Secondary | ICD-10-CM

## 2013-08-26 LAB — COMPREHENSIVE METABOLIC PANEL (CC13)
ALT: 45 U/L (ref 0–55)
ANION GAP: 10 meq/L (ref 3–11)
AST: 50 U/L — ABNORMAL HIGH (ref 5–34)
Albumin: 2.2 g/dL — ABNORMAL LOW (ref 3.5–5.0)
Alkaline Phosphatase: 568 U/L — ABNORMAL HIGH (ref 40–150)
BUN: 20.9 mg/dL (ref 7.0–26.0)
CALCIUM: 8.9 mg/dL (ref 8.4–10.4)
CO2: 25 meq/L (ref 22–29)
Chloride: 99 mEq/L (ref 98–109)
Creatinine: 0.7 mg/dL (ref 0.6–1.1)
Glucose: 110 mg/dl (ref 70–140)
Potassium: 2.9 mEq/L — CL (ref 3.5–5.1)
SODIUM: 134 meq/L — AB (ref 136–145)
TOTAL PROTEIN: 5.5 g/dL — AB (ref 6.4–8.3)
Total Bilirubin: 1.09 mg/dL (ref 0.20–1.20)

## 2013-08-26 LAB — CBC WITH DIFFERENTIAL/PLATELET
HEMATOCRIT: 27.6 % — AB (ref 34.8–46.6)
HGB: 9.6 g/dL — ABNORMAL LOW (ref 11.6–15.9)
MCH: 28.8 pg (ref 25.1–34.0)
MCHC: 34.8 g/dL (ref 31.5–36.0)
MCV: 82.9 fL (ref 79.5–101.0)
Platelets: 73 10*3/uL — ABNORMAL LOW (ref 145–400)
RBC: 3.33 10*6/uL — ABNORMAL LOW (ref 3.70–5.45)
RDW: 17 % — ABNORMAL HIGH (ref 11.2–14.5)
WBC: <0.2 10*3/uL — CL (ref 3.9–10.3)

## 2013-08-26 LAB — TECHNOLOGIST REVIEW

## 2013-08-26 MED ORDER — LEVOFLOXACIN 500 MG PO TABS
500.0000 mg | ORAL_TABLET | Freq: Every day | ORAL | Status: AC
Start: 2013-08-26 — End: ?

## 2013-08-26 MED ORDER — POTASSIUM CHLORIDE ER 20 MEQ PO TBCR: 40.0000 meq | EXTENDED_RELEASE_TABLET | Freq: Every day | ORAL | Status: DC

## 2013-08-26 MED ORDER — FLUCONAZOLE 200 MG PO TABS: 200.0000 mg | ORAL_TABLET | Freq: Every day | ORAL | Status: AC

## 2013-08-26 MED ORDER — SODIUM CHLORIDE 0.9 % IV SOLN
INTRAVENOUS | Status: DC
  Administered 2013-08-26: 12:00:00 via INTRAVENOUS

## 2013-08-26 NOTE — Telephone Encounter (Signed)
Moved appt  to MD from [place] , [place] sick, per Dr. Julien Nordmann

## 2013-08-26 NOTE — Progress Notes (Signed)
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  PRIOR THERAPY: 1) Left suboccipital craniectomy for posterior resection of cerebellar tumor using microdissection on 07/07/2013. 2) status post palliative radiotherapy to the surgical resection cavity in the brain under the care of Dr. Tammi Klippel. 2) status post palliative radiotherapy to the right upper lobe lung mass for relief of SVC symptoms under the care of Dr. Tammi Klippel completed 08/06/2013.  CURRENT THERAPY: Systemic chemotherapy with carboplatin for AUC of 5 on day 1 and etoposide 100 mg/M2 on days 1, 2 and 3 with Neulasta support on day 4.   INTERVAL HISTORY: Rachael Wall 73 y.o. female returns to the clinic today for new patient visit accompanied by her daughter, Apolonio Schneiders. The patient started the first cycle of systemic chemotherapy with carboplatin and etoposide last week. She has been complaining of increasing fatigue and weakness for several days after her chemotherapy which improves starting yesterday. She denied having any significant chest pain but continues to have shortness breath with exertion. The patient denied having any significant fever or chills, no nausea or vomiting. She has poor by mouth intake and is dehydrated today with increased heart rate. She also has lack of taste and poor appetite with evidence of oral thrush. She is here today for evaluation and management of her condition.  FAMILY HISTORY: Brother died of lung cancer.  SOCIAL HISTORY: The patient is single and has 3 children. She lives with her daughter Apolonio [place]iders in [place]. She has a history of smoking but quit 2 months ago. No history of alcohol or drug abuse.   MEDICAL HISTORY: Past Medical  History  Diagnosis Date  . Diverticulitis     ALLERGIES:  has No Known Allergies.  MEDICATIONS:  Current Outpatient Prescriptions  Medication Sig Dispense Refill  . allopurinol (ZYLOPRIM) 100 MG tablet Take 1 tablet (100 mg total) by mouth 2 (two) times daily.  60 tablet  1  . budesonide-formoterol (SYMBICORT) 160-4.5 MCG/ACT inhaler Inhale 2 puffs into the lungs See admin instructions. Every 9 hours.      . feeding supplement, ENSURE COMPLETE, (ENSURE COMPLETE) LIQD Take 237 mLs by mouth 2 (two) times daily between meals.  10 Bottle  0  . furosemide (LASIX) 20 MG tablet Take 1 tablet (20 mg total) by mouth daily. Every other day, if needed for ankle swelling.  20 tablet  2  . lactose free nutrition (BOOST PLUS) LIQD Take 237 mLs by mouth 3 (three) times daily between meals.  10 Can  0  . ondansetron (ZOFRAN) 4 MG tablet Take 1 tablet (4 mg total) by mouth every 6 (six) hours as needed for nausea.  20 tablet  0  . oxyCODONE-acetaminophen (PERCOCET/ROXICET) 5-325 MG per tablet Take 1-2 tablets by mouth every 6 (six) hours as needed for severe pain.  120 tablet  0  . pantoprazole (PROTONIX) 40 MG tablet Take 40 mg by mouth 2 (two) times daily.      . prochlorperazine (COMPAZINE) 10 MG tablet Take 1 tablet (10 mg total) by mouth every 6 (six) hours as needed for nausea or vomiting.  60 tablet  0  . protein supplement (RESOURCE BENEPROTEIN) POWD Take 6 g by mouth 3 (three) times daily with meals.  10 Can  0  .  sucralfate (CARAFATE) 1 G tablet Take 1 tablet (1 g total) by mouth 4 (four) times daily -  with meals and at bedtime. 5 minutes before meal for esophagitis  90 tablet  3  . Wound Cleansers (RADIAPLEX EX) Apply topically.       No current facility-administered medications for this visit.    SURGICAL HISTORY:  Past Surgical History  Procedure Laterality Date  . Suboccipital craniectomy cervical laminectomy N/A 07/07/2013    Procedure: Left suboccipital craniectomy for resection of tumor  ;  Surgeon: Ophelia Charter, MD;  Location: [facility];  Service: Neurosurgery;  Laterality: N/A;    REVIEW OF SYSTEMS:  Constitutional: positive for anorexia, fatigue and weight loss Eyes: negative Ears, nose, mouth, throat, and face: positive for sore mouth Respiratory: positive for cough and dyspnea on exertion Cardiovascular: negative Gastrointestinal: negative Genitourinary:negative Integument/breast: negative Hematologic/lymphatic: negative Musculoskeletal:positive for back pain Neurological: negative Behavioral/Psych: negative Endocrine: negative Allergic/Immunologic: negative   PHYSICAL EXAMINATION: General appearance: alert, cooperative, fatigued and no distress Head: Normocephalic, without obvious abnormality, atraumatic, Oral thrush Neck: no adenopathy, no JVD, supple, symmetrical, trachea midline and thyroid not enlarged, symmetric, no tenderness/mass/nodules Lymph nodes: Cervical, supraclavicular, and axillary nodes normal. Resp: clear to auscultation bilaterally Back: symmetric, no curvature. ROM normal. No CVA tenderness. Cardio: regular rate and rhythm, S1, S2 normal, no murmur, click, rub or gallop GI: soft, non-tender; bowel sounds normal; no masses,  no organomegaly Extremities: extremities normal, atraumatic, no cyanosis or edema Neurologic: Alert and oriented X 3, normal strength and tone. Normal symmetric reflexes. Normal coordination and gait  ECOG PERFORMANCE STATUS: 2 - Symptomatic, <50% confined to bed  Blood pressure 84/55, pulse 139, temperature 98.3 F (36.8 C), temperature source Axillary, resp. rate 16, height 4\' 11"  (1.499 m), weight 86 lb 3.2 oz (39.1 kg), SpO2 98.00%.  LABORATORY DATA: Lab Results  Component Value Date   WBC < 0.2* 08/26/2013   HGB 9.6* 08/26/2013   HCT 27.6* 08/26/2013   MCV 82.9 08/26/2013   PLT 73* 08/26/2013      Chemistry      Component Value Date/Time   NA 129* 08/19/2013 0832   NA 129* 07/10/2013 0245   K 4.0 08/19/2013  0832   K 4.2 07/10/2013 0245   CL 93* 07/10/2013 0245   CO2 18* 08/19/2013 0832   CO2 25 07/10/2013 0245   BUN 19.8 08/19/2013 0832   BUN 15 07/10/2013 0245   CREATININE 0.7 08/19/2013 0832   CREATININE 0.50 07/10/2013 0245      Component Value Date/Time   CALCIUM 9.5 08/19/2013 0832   CALCIUM 8.9 07/10/2013 0245   ALKPHOS 757* 08/19/2013 0832   ALKPHOS 260* 07/02/2013 1541   AST 58* 08/19/2013 0832   AST 85* 07/02/2013 1541   ALT 69* 08/19/2013 0832   ALT 27 07/02/2013 1541   BILITOT 0.96 08/19/2013 0832   BILITOT 0.6 07/02/2013 1541       RADIOGRAPHIC STUDIES:  ASSESSMENT AND PLAN: This is a very pleasant 73 years old white female recently diagnosed with metastatic poorly differentiated high-grade neuroendocrine carcinoma, large cell type with significant disease progression in the chest, abdomen, liver, bone, pancreas and brain. She underwent resection of a cerebellar brain lesion followed by palliative radiotherapy to the resection cavity in addition to palliative radiotherapy to the right upper lobe hilar mass. The patient is currently on systemic chemotherapy with carboplatin and etoposide status post 1 cycle. She has increasing fatigue and weakness after the first cycle of her treatment  but this is improving in the last few days. She has significant neutropenia with no fever today. The patient received Neulasta injection after her chemotherapy. I will cover her with empiric antibiotics in the form of Levaquin 500 mg by mouth daily for 7 days. I also ordered Diflucan 200 mg by mouth daily for 10 days for the oral thrush. The patient was also dehydrated today with tachycardia secondary to poor by mouth intake. I will arrange for her to receive 1 L of normal saline IV today. The patient would come back for followup visit in 2 weeks for reevaluation and management any adverse effect of her treatment before starting cycle #2. She was advised to call immediately if she has any concerning symptoms in  the interval.  The patient voices understanding of current disease status and treatment options and is in agreement with the current care plan.  All questions were answered. The patient knows to call the clinic with any problems, questions or concerns. We can certainly see the patient much sooner if necessary.  I spent 15 minutes counseling the patient face to face. The total time spent in the appointment was 25 minutes.  Disclaimer: This note was dictated with voice recognition software. Similar sounding words can inadvertently be transcribed and may not be corrected upon review.

## 2013-08-26 NOTE — Telephone Encounter (Signed)
Per staff message and POF I have scheduled appts.  JMW  

## 2013-08-26 NOTE — Telephone Encounter (Signed)
Gave pt appt for lab,md and chemo for May and June 2015 °

## 2013-08-26 NOTE — Procedures (Signed)
[facility]  Procedure Note  Rachael Wall FUX:[id number] DOB: Aug 12, 1940 DOA: 08/26/2013   Ordering Provider:  Mayme Genta  Associated Diagnosis: Dehydration  Procedure Note: Infused 1 Liter NS with no complications   Condition During Procedure: Tolerated well   Condition at Discharge: Patient discharged home. No complaints, patient in no apparent distress. Patient left day hospital with belongings via wheelchair escorted by daughter.    Wendie Simmer, RN  [facility]

## 2013-08-26 NOTE — Telephone Encounter (Signed)
Pt in the clinic being evaluated by Dr Julien Nordmann.  Pt's daughter is requesting for O2 saturations to be checked to see if pt needs oxygen at home.  Resting O2 on room air was 98% today in the clinic.  When attempted to get a ambulating room air O2 saturation pt requested a walker.  The walker we have at [facility] does not have wheels.  Pt is unable to walk with a walker with no wheels on it.  Called pt's [facility] nurse, Malachy Mood.  She will check her O2 saturation while ambulating at home, and if needed will have a respiratory therapy consult to have O2 ordered for patient.  Informed pt's daughter.  K 2.9, per Dr Vista Mink, 68meq x 10 days.  Informed pt's daughter as well.  SLJ

## 2013-08-26 NOTE — Discharge Instructions (Signed)
Dehydration, Adult Dehydration is when you lose more fluids from the body than you take in. Vital organs like the kidneys, brain, and heart cannot function without a proper amount of fluids and salt. Any loss of fluids from the body can cause dehydration.  CAUSES   Vomiting.  Diarrhea.  Excessive sweating.  Excessive urine output.  Fever. SYMPTOMS  Mild dehydration  Thirst.  Dry lips.  Slightly dry mouth. Moderate dehydration  Very dry mouth.  Sunken eyes.  Skin does not bounce back quickly when lightly pinched and released.  Dark urine and decreased urine production.  Decreased tear production.  Headache. Severe dehydration  Very dry mouth.  Extreme thirst.  Rapid, weak pulse (more than 100 beats per minute at rest).  Cold hands and feet.  Not able to sweat in spite of heat and temperature.  Rapid breathing.  Blue lips.  Confusion and lethargy.  Difficulty being awakened.  Minimal urine production.  No tears. DIAGNOSIS  Your caregiver will diagnose dehydration based on your symptoms and your exam. Blood and urine tests will help confirm the diagnosis. The diagnostic evaluation should also identify the cause of dehydration. TREATMENT  Treatment of mild or moderate dehydration can often be done at home by increasing the amount of fluids that you drink. It is best to drink small amounts of fluid more often. Drinking too much at one time can make vomiting worse. Refer to the home care instructions below. Severe dehydration needs to be treated at the hospital where you will probably be given intravenous (IV) fluids that contain water and electrolytes. HOME CARE INSTRUCTIONS   Ask your caregiver about specific rehydration instructions.  Drink enough fluids to keep your urine clear or pale yellow.  Drink small amounts frequently if you have nausea and vomiting.  Eat as you normally do.  Avoid:  Foods or drinks high in sugar.  Carbonated  drinks.  Juice.  Extremely hot or cold fluids.  Drinks with caffeine.  Fatty, greasy foods.  Alcohol.  Tobacco.  Overeating.  Gelatin desserts.  Wash your hands well to avoid spreading bacteria and viruses.  Only take over-the-counter or prescription medicines for pain, discomfort, or fever as directed by your caregiver.  Ask your caregiver if you should continue all prescribed and over-the-counter medicines.  Keep all follow-up appointments with your caregiver. SEEK MEDICAL CARE IF:  You have abdominal pain and it increases or stays in one area (localizes).  You have a rash, stiff neck, or severe headache.  You are irritable, sleepy, or difficult to awaken.  You are weak, dizzy, or extremely thirsty. SEEK IMMEDIATE MEDICAL CARE IF:   You are unable to keep fluids down or you get worse despite treatment.  You have frequent episodes of vomiting or diarrhea.  You have blood or green matter (bile) in your vomit.  You have blood in your stool or your stool looks black and tarry.  You have not urinated in 6 to 8 hours, or you have only urinated a small amount of very dark urine.  You have a fever.  You faint. MAKE SURE YOU:   Understand these instructions.  Will watch your condition.  Will get help right away if you are not doing well or get worse. Document Released: 04/10/2005 Document Revised: 07/03/2011 Document Reviewed: 11/28/2010 [facility] Patient Information 2014 [place], [place].  Dehydration, Elderly Dehydration is when you lose more fluids from the body than you take in. Vital organs such as the kidneys, brain, and heart cannot function  without a proper amount of fluids and salt. Any loss of fluids from the body can cause dehydration.  Older adults are at a higher risk of dehydration than younger adults. As we age, our bodies are less able to conserve water and do not respond to temperature changes as well. Also, older adults do not become thirsty as  easily or quickly. Because of this, older adults often do not realize they need to increase fluids to avoid dehydration.  CAUSES   Vomiting.  Diarrhea.  Excessive sweating.  Excessive urination.  Fever.  Certain medicines, such as blood pressure medicines called diuretics.  Poorly controlled blood sugars. SIGNS AND SYMPTOMS  Mild dehydration:  Thirst.  Dry lips.  Slightly dry mouth. Moderate dehydration:  Very dry mouth.  Sunken eyes.  Skin does not bounce back quickly when lightly pinched and released.  Dark urine and decreased urine production.  Decreased tear production.  Headache. Severe dehydration:  Very dry mouth.  Extreme thirst.  Rapid, weak pulse (more than 100 beats per minute at rest).  Cold hands and feet.  Not able to sweat in spite of heat.  Rapid breathing.  Blue lips.  Confusion and lethargy.  Difficulty being awakened.  Minimal urine production.  No tears. DIAGNOSIS  Your health care provider will diagnose dehydration based on your symptoms and your exam. Blood and urine tests will help confirm the diagnosis. The diagnostic evaluation should also identify the cause of dehydration. TREATMENT  Treatment of mild or moderate dehydration can often be done at home by increasing the amount of fluids that you drink. It is best to drink small amounts of fluid more often. Drinking too much at one time can make vomiting worse. Severe dehydration needs to be treated at the hospital. You may be given IV fluids that contain water and electrolytes. HOME CARE INSTRUCTIONS   Ask your health care provider about specific rehydration instructions.  Drink enough fluids to keep your urine clear or pale yellow.  Drink small amounts frequently if you have nausea and vomiting.  Eat as you normally do.  Avoid:  Foods or drinks high in sugar.  Carbonated drinks.  Juice.  Extremely hot or cold fluids.  Drinks with caffeine.  Fatty, greasy  foods.  Alcohol.  Tobacco.  Overeating.  Gelatin desserts.  Wash your hands well to avoid spreading bacteria and viruses.  Only take over-the-counter or prescription medicines for pain, discomfort, or fever as directed by your health care provider.  Ask your health care provider if you should continue all prescribed and over-the-counter medicines.  Keep all follow-up appointments with your health care provider. SEEK MEDICAL CARE IF:  You have abdominal pain, and it increases or stays in one area (localizes).  You have a rash, stiff neck, or severe headache.  You are irritable, sleepy, or difficult to awaken.  You are weak, dizzy, or extremely thirsty. SEEK IMMEDIATE MEDICAL CARE IF:   You are unable to keep fluids down, or you get worse despite treatment.  You have frequent episodes of vomiting or diarrhea.  You have blood or green matter (bile) in your vomit.  You have blood in your stool, or your stool looks black and tarry.  You have not urinated in 6 8 hours, or you have only urinated a small amount of very dark urine.  You have a fever.  You faint. MAKE SURE YOU:   Understand these instructions.  Will watch your condition.  Will get help right away if you are  not doing well or get worse. Document Released: 07/01/2003 Document Revised: 01/29/2013 Document Reviewed: 12/16/2012 [facility] Patient Information 2014 [place].

## 2013-08-27 ENCOUNTER — Telehealth: Payer: Self-pay | Admitting: *Deleted

## 2013-08-27 ENCOUNTER — Telehealth: Payer: Self-pay | Admitting: Internal Medicine

## 2013-08-27 ENCOUNTER — Other Ambulatory Visit: Payer: Self-pay | Admitting: *Deleted

## 2013-08-27 DIAGNOSIS — C7800 Secondary malignant neoplasm of unspecified lung: Secondary | ICD-10-CM

## 2013-08-27 NOTE — Telephone Encounter (Signed)
Malachy Mood called stating that she assessed pt at home, O2 saturation 94% while ambulating.  Her HR increased from 111 at rest to 137 after walking for about 10 steps.  Per Dr Vista Mink, pt needs IVF tomorrow and Friday due to dehydration.  Coordinated with [facility] to give IVF on Friday 08/29/13, however the fluids won't be delivered until 08/29/13 so she will get fluids 08/28/13 at [facility].  Pt's daughter verbalized understanding.  SLJ

## 2013-08-27 NOTE — Telephone Encounter (Signed)
Talked to pt's daughter, pt will be here tomorrow to to get appt calendar for May and June 2015

## 2013-08-28 ENCOUNTER — Ambulatory Visit (HOSPITAL_BASED_OUTPATIENT_CLINIC_OR_DEPARTMENT_OTHER): Payer: Commercial Managed Care - HMO

## 2013-08-28 VITALS — BP 109/61 | HR 119 | Temp 98.2°F | Resp 18

## 2013-08-28 DIAGNOSIS — C7800 Secondary malignant neoplasm of unspecified lung: Secondary | ICD-10-CM

## 2013-08-28 DIAGNOSIS — E860 Dehydration: Secondary | ICD-10-CM

## 2013-08-28 MED ORDER — SODIUM CHLORIDE 0.9 % IV SOLN
Freq: Once | INTRAVENOUS | Status: AC
  Administered 2013-08-28: 15:00:00 via INTRAVENOUS

## 2013-08-28 NOTE — Patient Instructions (Signed)
Dehydration, Adult Dehydration is when you lose more fluids from the body than you take in. Vital organs like the kidneys, brain, and heart cannot function without a proper amount of fluids and salt. Any loss of fluids from the body can cause dehydration.  CAUSES   Vomiting.  Diarrhea.  Excessive sweating.  Excessive urine output.  Fever. SYMPTOMS  Mild dehydration  Thirst.  Dry lips.  Slightly dry mouth. Moderate dehydration  Very dry mouth.  Sunken eyes.  Skin does not bounce back quickly when lightly pinched and released.  Dark urine and decreased urine production.  Decreased tear production.  Headache. Severe dehydration  Very dry mouth.  Extreme thirst.  Rapid, weak pulse (more than 100 beats per minute at rest).  Cold hands and feet.  Not able to sweat in spite of heat and temperature.  Rapid breathing.  Blue lips.  Confusion and lethargy.  Difficulty being awakened.  Minimal urine production.  No tears. DIAGNOSIS  Your caregiver will diagnose dehydration based on your symptoms and your exam. Blood and urine tests will help confirm the diagnosis. The diagnostic evaluation should also identify the cause of dehydration. TREATMENT  Treatment of mild or moderate dehydration can often be done at home by increasing the amount of fluids that you drink. It is best to drink small amounts of fluid more often. Drinking too much at one time can make vomiting worse. Refer to the home care instructions below. Severe dehydration needs to be treated at the hospital where you will probably be given intravenous (IV) fluids that contain water and electrolytes. HOME CARE INSTRUCTIONS   Ask your caregiver about specific rehydration instructions.  Drink enough fluids to keep your urine clear or pale yellow.  Drink small amounts frequently if you have nausea and vomiting.  Eat as you normally do.  Avoid:  Foods or drinks high in sugar.  Carbonated  drinks.  Juice.  Extremely hot or cold fluids.  Drinks with caffeine.  Fatty, greasy foods.  Alcohol.  Tobacco.  Overeating.  Gelatin desserts.  Wash your hands well to avoid spreading bacteria and viruses.  Only take over-the-counter or prescription medicines for pain, discomfort, or fever as directed by your caregiver.  Ask your caregiver if you should continue all prescribed and over-the-counter medicines.  Keep all follow-up appointments with your caregiver. SEEK MEDICAL CARE IF:  You have abdominal pain and it increases or stays in one area (localizes).  You have a rash, stiff neck, or severe headache.  You are irritable, sleepy, or difficult to awaken.  You are weak, dizzy, or extremely thirsty. SEEK IMMEDIATE MEDICAL CARE IF:   You are unable to keep fluids down or you get worse despite treatment.  You have frequent episodes of vomiting or diarrhea.  You have blood or green matter (bile) in your vomit.  You have blood in your stool or your stool looks black and tarry.  You have not urinated in 6 to 8 hours, or you have only urinated a small amount of very dark urine.  You have a fever.  You faint. MAKE SURE YOU:   Understand these instructions.  Will watch your condition.  Will get help right away if you are not doing well or get worse. Document Released: 04/10/2005 Document Revised: 07/03/2011 Document Reviewed: 11/28/2010 [employer] Patient Information 2014 [employer].  

## 2013-08-30 ENCOUNTER — Encounter (HOSPITAL_COMMUNITY): Payer: Self-pay | Admitting: Emergency Medicine

## 2013-08-30 ENCOUNTER — Inpatient Hospital Stay (HOSPITAL_COMMUNITY)
Admission: EM | Admit: 2013-08-30 | Discharge: 2013-09-22 | DRG: 808 | Disposition: E | Payer: Medicare PPO | Attending: Internal Medicine | Admitting: Internal Medicine

## 2013-08-30 ENCOUNTER — Other Ambulatory Visit: Payer: Self-pay

## 2013-08-30 DIAGNOSIS — D709 Neutropenia, unspecified: Secondary | ICD-10-CM | POA: Diagnosis present

## 2013-08-30 DIAGNOSIS — R000 Tachycardia, unspecified: Secondary | ICD-10-CM

## 2013-08-30 DIAGNOSIS — I248 Other forms of acute ischemic heart disease: Secondary | ICD-10-CM | POA: Diagnosis present

## 2013-08-30 DIAGNOSIS — C787 Secondary malignant neoplasm of liver and intrahepatic bile duct: Secondary | ICD-10-CM | POA: Diagnosis present

## 2013-08-30 DIAGNOSIS — R918 Other nonspecific abnormal finding of lung field: Secondary | ICD-10-CM

## 2013-08-30 DIAGNOSIS — R040 Epistaxis: Secondary | ICD-10-CM | POA: Diagnosis present

## 2013-08-30 DIAGNOSIS — I5021 Acute systolic (congestive) heart failure: Secondary | ICD-10-CM | POA: Diagnosis present

## 2013-08-30 DIAGNOSIS — Z801 Family history of malignant neoplasm of trachea, bronchus and lung: Secondary | ICD-10-CM

## 2013-08-30 DIAGNOSIS — C7931 Secondary malignant neoplasm of brain: Secondary | ICD-10-CM | POA: Diagnosis present

## 2013-08-30 DIAGNOSIS — I509 Heart failure, unspecified: Secondary | ICD-10-CM | POA: Diagnosis present

## 2013-08-30 DIAGNOSIS — Z681 Body mass index (BMI) 19 or less, adult: Secondary | ICD-10-CM

## 2013-08-30 DIAGNOSIS — L89309 Pressure ulcer of unspecified buttock, unspecified stage: Secondary | ICD-10-CM | POA: Diagnosis present

## 2013-08-30 DIAGNOSIS — I2489 Other forms of acute ischemic heart disease: Secondary | ICD-10-CM | POA: Diagnosis present

## 2013-08-30 DIAGNOSIS — Z87891 Personal history of nicotine dependence: Secondary | ICD-10-CM

## 2013-08-30 DIAGNOSIS — J189 Pneumonia, unspecified organism: Secondary | ICD-10-CM | POA: Diagnosis present

## 2013-08-30 DIAGNOSIS — J9600 Acute respiratory failure, unspecified whether with hypoxia or hypercapnia: Secondary | ICD-10-CM

## 2013-08-30 DIAGNOSIS — R799 Abnormal finding of blood chemistry, unspecified: Secondary | ICD-10-CM | POA: Diagnosis present

## 2013-08-30 DIAGNOSIS — L8992 Pressure ulcer of unspecified site, stage 2: Secondary | ICD-10-CM | POA: Diagnosis present

## 2013-08-30 DIAGNOSIS — I5023 Acute on chronic systolic (congestive) heart failure: Secondary | ICD-10-CM | POA: Diagnosis present

## 2013-08-30 DIAGNOSIS — B370 Candidal stomatitis: Secondary | ICD-10-CM | POA: Diagnosis present

## 2013-08-30 DIAGNOSIS — K5900 Constipation, unspecified: Secondary | ICD-10-CM | POA: Diagnosis present

## 2013-08-30 DIAGNOSIS — J439 Emphysema, unspecified: Secondary | ICD-10-CM | POA: Diagnosis present

## 2013-08-30 DIAGNOSIS — E43 Unspecified severe protein-calorie malnutrition: Secondary | ICD-10-CM | POA: Diagnosis present

## 2013-08-30 DIAGNOSIS — R627 Adult failure to thrive: Secondary | ICD-10-CM | POA: Diagnosis present

## 2013-08-30 DIAGNOSIS — I959 Hypotension, unspecified: Secondary | ICD-10-CM | POA: Diagnosis present

## 2013-08-30 DIAGNOSIS — E870 Hyperosmolality and hypernatremia: Secondary | ICD-10-CM | POA: Diagnosis not present

## 2013-08-30 DIAGNOSIS — I4891 Unspecified atrial fibrillation: Secondary | ICD-10-CM | POA: Diagnosis present

## 2013-08-30 DIAGNOSIS — I498 Other specified cardiac arrhythmias: Secondary | ICD-10-CM | POA: Diagnosis not present

## 2013-08-30 DIAGNOSIS — C3490 Malignant neoplasm of unspecified part of unspecified bronchus or lung: Secondary | ICD-10-CM | POA: Diagnosis present

## 2013-08-30 DIAGNOSIS — C7949 Secondary malignant neoplasm of other parts of nervous system: Secondary | ICD-10-CM

## 2013-08-30 DIAGNOSIS — R1310 Dysphagia, unspecified: Secondary | ICD-10-CM | POA: Diagnosis present

## 2013-08-30 DIAGNOSIS — R748 Abnormal levels of other serum enzymes: Secondary | ICD-10-CM | POA: Diagnosis present

## 2013-08-30 DIAGNOSIS — I480 Paroxysmal atrial fibrillation: Secondary | ICD-10-CM | POA: Diagnosis not present

## 2013-08-30 DIAGNOSIS — Z515 Encounter for palliative care: Secondary | ICD-10-CM

## 2013-08-30 DIAGNOSIS — E876 Hypokalemia: Secondary | ICD-10-CM | POA: Diagnosis present

## 2013-08-30 DIAGNOSIS — C7951 Secondary malignant neoplasm of bone: Secondary | ICD-10-CM | POA: Diagnosis present

## 2013-08-30 DIAGNOSIS — IMO0002 Reserved for concepts with insufficient information to code with codable children: Secondary | ICD-10-CM

## 2013-08-30 DIAGNOSIS — D61818 Other pancytopenia: Secondary | ICD-10-CM | POA: Diagnosis present

## 2013-08-30 DIAGNOSIS — Z66 Do not resuscitate: Secondary | ICD-10-CM | POA: Diagnosis not present

## 2013-08-30 DIAGNOSIS — E871 Hypo-osmolality and hyponatremia: Secondary | ICD-10-CM | POA: Diagnosis present

## 2013-08-30 DIAGNOSIS — E875 Hyperkalemia: Secondary | ICD-10-CM | POA: Diagnosis present

## 2013-08-30 DIAGNOSIS — T451X5A Adverse effect of antineoplastic and immunosuppressive drugs, initial encounter: Principal | ICD-10-CM | POA: Diagnosis present

## 2013-08-30 DIAGNOSIS — D61810 Antineoplastic chemotherapy induced pancytopenia: Principal | ICD-10-CM | POA: Diagnosis present

## 2013-08-30 DIAGNOSIS — C7889 Secondary malignant neoplasm of other digestive organs: Secondary | ICD-10-CM | POA: Diagnosis present

## 2013-08-30 DIAGNOSIS — C7952 Secondary malignant neoplasm of bone marrow: Secondary | ICD-10-CM

## 2013-08-30 DIAGNOSIS — Z79899 Other long term (current) drug therapy: Secondary | ICD-10-CM

## 2013-08-30 DIAGNOSIS — I428 Other cardiomyopathies: Secondary | ICD-10-CM | POA: Diagnosis present

## 2013-08-30 DIAGNOSIS — C3491 Malignant neoplasm of unspecified part of right bronchus or lung: Secondary | ICD-10-CM

## 2013-08-30 DIAGNOSIS — C7800 Secondary malignant neoplasm of unspecified lung: Secondary | ICD-10-CM | POA: Diagnosis present

## 2013-08-30 DIAGNOSIS — J438 Other emphysema: Secondary | ICD-10-CM | POA: Diagnosis present

## 2013-08-30 HISTORY — DX: Other malignant neuroendocrine tumors: C7A.8

## 2013-08-30 HISTORY — DX: Other secondary neuroendocrine tumors: C7B.8

## 2013-08-30 LAB — URINALYSIS, ROUTINE W REFLEX MICROSCOPIC
Bilirubin Urine: NEGATIVE
GLUCOSE, UA: NEGATIVE mg/dL
Hgb urine dipstick: NEGATIVE
Ketones, ur: NEGATIVE mg/dL
Leukocytes, UA: NEGATIVE
NITRITE: NEGATIVE
Protein, ur: 30 mg/dL — AB
Specific Gravity, Urine: 1.012 (ref 1.005–1.030)
Urobilinogen, UA: 1 mg/dL (ref 0.0–1.0)
pH: 6 (ref 5.0–8.0)

## 2013-08-30 LAB — CBC
HCT: 21 % — ABNORMAL LOW (ref 36.0–46.0)
Hemoglobin: 7.6 g/dL — ABNORMAL LOW (ref 12.0–15.0)
MCH: 29.9 pg (ref 26.0–34.0)
MCHC: 36.2 g/dL — ABNORMAL HIGH (ref 30.0–36.0)
MCV: 82.7 fL (ref 78.0–100.0)
PLATELETS: 8 10*3/uL — AB (ref 150–400)
RBC: 2.54 MIL/uL — ABNORMAL LOW (ref 3.87–5.11)
RDW: 15.8 % — ABNORMAL HIGH (ref 11.5–15.5)
WBC: 0.2 10*3/uL — CL (ref 4.0–10.5)

## 2013-08-30 LAB — COMPREHENSIVE METABOLIC PANEL
ALT: 24 U/L (ref 0–35)
AST: 22 U/L (ref 0–37)
Albumin: 2 g/dL — ABNORMAL LOW (ref 3.5–5.2)
Alkaline Phosphatase: 348 U/L — ABNORMAL HIGH (ref 39–117)
BUN: 11 mg/dL (ref 6–23)
CALCIUM: 7.8 mg/dL — AB (ref 8.4–10.5)
CO2: 24 meq/L (ref 19–32)
CREATININE: 0.56 mg/dL (ref 0.50–1.10)
Chloride: 90 mEq/L — ABNORMAL LOW (ref 96–112)
GFR, EST NON AFRICAN AMERICAN: 90 mL/min — AB (ref >90)
Glucose, Bld: 79 mg/dL (ref 70–99)
Potassium: 2.3 mEq/L — CL (ref 3.7–5.3)
Sodium: 130 mEq/L — ABNORMAL LOW (ref 137–147)
Total Bilirubin: 1 mg/dL (ref 0.3–1.2)
Total Protein: 5.5 g/dL — ABNORMAL LOW (ref 6.0–8.3)

## 2013-08-30 LAB — URINE MICROSCOPIC-ADD ON

## 2013-08-30 LAB — ABO/RH: ABO/RH(D): B POS

## 2013-08-30 LAB — LIPASE, BLOOD: LIPASE: 22 U/L (ref 11–59)

## 2013-08-30 MED ORDER — ENSURE COMPLETE PO LIQD
237.0000 mL | Freq: Two times a day (BID) | ORAL | Status: DC
  Administered 2013-09-01: 237 mL via ORAL

## 2013-08-30 MED ORDER — BENEPROTEIN PO POWD
1.0000 | Freq: Three times a day (TID) | ORAL | Status: DC
  Administered 2013-08-31 – 2013-09-02 (×3): 6 g via ORAL
  Filled 2013-08-30 (×2): qty 227

## 2013-08-30 MED ORDER — PANTOPRAZOLE SODIUM 40 MG PO TBEC
40.0000 mg | DELAYED_RELEASE_TABLET | Freq: Two times a day (BID) | ORAL | Status: DC
  Administered 2013-08-30 – 2013-09-09 (×19): 40 mg via ORAL
  Filled 2013-08-30 (×27): qty 1

## 2013-08-30 MED ORDER — POTASSIUM CHLORIDE 10 MEQ/100ML IV SOLN
10.0000 meq | Freq: Once | INTRAVENOUS | Status: AC
  Administered 2013-08-30: 10 meq via INTRAVENOUS
  Filled 2013-08-30: qty 100

## 2013-08-30 MED ORDER — POTASSIUM CHLORIDE CRYS ER 20 MEQ PO TBCR
40.0000 meq | EXTENDED_RELEASE_TABLET | Freq: Once | ORAL | Status: DC
  Filled 2013-08-30: qty 2

## 2013-08-30 MED ORDER — BIOTENE DRY MOUTH MT LIQD
15.0000 mL | Freq: Two times a day (BID) | OROMUCOSAL | Status: DC
  Administered 2013-08-30 – 2013-09-08 (×16): 15 mL via OROMUCOSAL

## 2013-08-30 MED ORDER — POTASSIUM CHLORIDE IN NACL 40-0.9 MEQ/L-% IV SOLN
INTRAVENOUS | Status: DC
  Administered 2013-08-30 – 2013-09-02 (×8): via INTRAVENOUS
  Filled 2013-08-30 (×11): qty 1000

## 2013-08-30 MED ORDER — POTASSIUM CHLORIDE 10 MEQ/100ML IV SOLN
10.0000 meq | INTRAVENOUS | Status: AC
  Administered 2013-08-30 – 2013-08-31 (×4): 10 meq via INTRAVENOUS
  Filled 2013-08-30 (×5): qty 100

## 2013-08-30 MED ORDER — FLUCONAZOLE 40 MG/ML PO SUSR: 200.0000 mg | Freq: Every day | ORAL | Status: DC

## 2013-08-30 MED ORDER — BOOST PLUS PO LIQD
237.0000 mL | Freq: Three times a day (TID) | ORAL | Status: DC
  Administered 2013-08-30 – 2013-09-02 (×6): 237 mL via ORAL
  Filled 2013-08-30 (×28): qty 237

## 2013-08-30 MED ORDER — ONDANSETRON HCL 4 MG/2ML IJ SOLN
4.0000 mg | Freq: Four times a day (QID) | INTRAMUSCULAR | Status: DC | PRN
  Administered 2013-09-02: 4 mg via INTRAVENOUS
  Filled 2013-08-30: qty 2

## 2013-08-30 MED ORDER — ONDANSETRON HCL 4 MG PO TABS: 4.0000 mg | ORAL_TABLET | ORAL | Status: DC | PRN

## 2013-08-30 MED ORDER — ONDANSETRON HCL 4 MG/2ML IJ SOLN
4.0000 mg | Freq: Once | INTRAMUSCULAR | Status: AC
  Administered 2013-08-30: 4 mg via INTRAVENOUS
  Filled 2013-08-30: qty 2

## 2013-08-30 MED ORDER — ACETAMINOPHEN 500 MG PO TABS: 500.0000 mg | ORAL_TABLET | Freq: Four times a day (QID) | ORAL | Status: DC | PRN

## 2013-08-30 MED ORDER — BUDESONIDE-FORMOTEROL FUMARATE 160-4.5 MCG/ACT IN AERO
2.0000 | INHALATION_SPRAY | Freq: Two times a day (BID) | RESPIRATORY_TRACT | Status: DC
  Administered 2013-08-30 – 2013-09-10 (×21): 2 via RESPIRATORY_TRACT
  Filled 2013-08-30: qty 6

## 2013-08-30 MED ORDER — ALUM & MAG HYDROXIDE-SIMETH 200-200-20 MG/5ML PO SUSP: 30.0000 mL | Freq: Four times a day (QID) | ORAL | Status: DC | PRN

## 2013-08-30 MED ORDER — PROCHLORPERAZINE MALEATE 10 MG PO TABS
10.0000 mg | ORAL_TABLET | Freq: Four times a day (QID) | ORAL | Status: DC | PRN
  Filled 2013-08-30: qty 1

## 2013-08-30 MED ORDER — ALLOPURINOL 100 MG PO TABS
100.0000 mg | ORAL_TABLET | Freq: Two times a day (BID) | ORAL | Status: DC
  Administered 2013-08-30 – 2013-09-09 (×20): 100 mg via ORAL
  Filled 2013-08-30 (×27): qty 1

## 2013-08-30 MED ORDER — FLUCONAZOLE 200 MG PO TABS
200.0000 mg | ORAL_TABLET | Freq: Every day | ORAL | Status: DC
  Administered 2013-08-30 – 2013-09-08 (×10): 200 mg via ORAL
  Filled 2013-08-30 (×10): qty 1

## 2013-08-30 MED ORDER — SUCRALFATE 1 G PO TABS
1.0000 g | ORAL_TABLET | Freq: Three times a day (TID) | ORAL | Status: DC
  Administered 2013-08-30 – 2013-09-09 (×28): 1 g via ORAL
  Filled 2013-08-30 (×55): qty 1

## 2013-08-30 MED ORDER — PANTOPRAZOLE SODIUM 40 MG PO PACK: 40.0000 mg | PACK | Freq: Two times a day (BID) | ORAL | Status: DC

## 2013-08-30 MED ORDER — SODIUM CHLORIDE 0.9 % IV BOLUS (SEPSIS)
500.0000 mL | Freq: Once | INTRAVENOUS | Status: AC
  Administered 2013-08-30: 500 mL via INTRAVENOUS

## 2013-08-30 MED ORDER — POLYETHYLENE GLYCOL 3350 17 G PO PACK
17.0000 g | PACK | Freq: Every day | ORAL | Status: DC
  Administered 2013-08-30 – 2013-09-06 (×6): 17 g via ORAL
  Filled 2013-08-30 (×14): qty 1

## 2013-08-30 MED ORDER — RESOURCE INSTANT PROTEIN PO PWD PACKET
1.0000 | Freq: Three times a day (TID) | ORAL | Status: DC
  Filled 2013-08-30 (×2): qty 6

## 2013-08-30 MED ORDER — POTASSIUM CHLORIDE 10 MEQ/100ML IV SOLN
10.0000 meq | INTRAVENOUS | Status: AC
  Administered 2013-08-30 (×3): 10 meq via INTRAVENOUS
  Filled 2013-08-30 (×2): qty 100

## 2013-08-30 MED ORDER — LEVOFLOXACIN 500 MG PO TABS: 500.0000 mg | ORAL_TABLET | Freq: Every day | ORAL | Status: DC

## 2013-08-30 NOTE — ED Notes (Signed)
Per pt, has had trouble swallowing, nausea and not feeling well since Wednesday.  Pt has been getting chemo.  Last chemo was last Thursday.

## 2013-08-30 NOTE — ED Provider Notes (Signed)
CSN: [id number]     Arrival date & time 09/05/2013  1222 History   First MD Initiated Contact with Patient 09/11/2013 1235     Chief Complaint  Patient presents with  . Nausea  . Dysphagia     (Consider location/radiation/quality/duration/timing/severity/associated sxs/prior Treatment) HPI Pt presents with c/o nausea as well as gagging/emesis when trying to eat and drink. She has recently diagnosed cancer with brain mets and was started on chemo.  Last chemo one week ago. She was seen at cancer center for IV fluids but has continued to feel weak and has not been able to take much PO.  No fever, no abdominal pain .  No change in bowel movements. No difficulty breathing.  There are no other associated systemic symptoms, there are no other alleviating or modifying factors.   Past Medical History  Diagnosis Date  . Diverticulitis   . 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 Suboccipital craniectomy cervical laminectomy N/A 07/07/2013    Procedure: Left suboccipital craniectomy for resection of tumor ;  Surgeon: Ophelia Charter, MD;  Location: [facility];  Service: Neurosurgery;  Laterality: N/A;   History reviewed. No pertinent family history. History  Substance Use Topics  . Smoking status: Former Smoker    Quit date: 02/22/2013  . Smokeless tobacco: Not on file  . Alcohol Use: No   OB History   Grav Para Term Preterm Abortions TAB SAB Ect Mult Living                 Review of Systems ROS reviewed and all otherwise negative except for mentioned in HPI    Allergies  Review of patient's allergies indicates no known allergies.  Home Medications   Prior to Admission medications   Medication Sig Start Date End Date Taking? Authorizing Provider  acetaminophen (TYLENOL) 500 MG tablet Take 500 mg by mouth every 6 (six) hours as needed for mild pain.   Yes Historical Provider, MD  allopurinol (ZYLOPRIM)  100 MG tablet Take 1 tablet (100 mg total) by mouth 2 (two) times daily. 08/12/13  Yes Curt Bears, MD  budesonide-formoterol [facility]) 160-4.5 MCG/ACT inhaler Inhale 2 puffs into the lungs 2 (two) times daily.    Yes Historical Provider, MD  dexamethasone (DECADRON) 4 MG tablet Take 4 mg by mouth daily.  08/03/13  Yes Historical Provider, MD  feeding supplement, ENSURE COMPLETE, (ENSURE COMPLETE) LIQD Take 237 mLs by mouth 2 (two) times daily between meals. 07/11/13  Yes Robbie Lis, MD  fluconazole (DIFLUCAN) 200 MG tablet Take 1 tablet (200 mg total) by mouth daily. 08/26/13  Yes Curt Bears, MD  furosemide (LASIX) 20 MG tablet Take 1 tablet (20 mg total) by mouth daily. Every other day, if needed for ankle swelling. 07/28/13  Yes Lora Paula, MD  KLOR-CON M20 20 MEQ tablet Take 40 mEq by mouth daily.  08/26/13  Yes Historical Provider, MD  lactose free nutrition (BOOST PLUS) LIQD Take 237 mLs by mouth 3 (three) times daily between meals. 07/11/13  Yes Robbie Lis, MD  levofloxacin (LEVAQUIN) 500 MG tablet Take 1 tablet (500 mg total) by mouth daily. 08/26/13  Yes Curt Bears, MD  loratadine (CLARITIN) 10 MG tablet Take 10 mg by mouth daily. For 5 days during chemo   Yes Historical Provider, MD  ondansetron (ZOFRAN) 4 MG tablet Take 4 mg by mouth 2 (two) times daily.  Yes Historical Provider, MD  oxyCODONE-acetaminophen (PERCOCET/ROXICET) 5-325 MG per tablet Take 1-2 tablets by mouth every 6 (six) hours as needed for severe pain. 08/04/13  Yes Lora Paula, MD  pantoprazole (PROTONIX) 40 MG tablet Take 40 mg by mouth 2 (two) times daily.   Yes Historical Provider, MD  pegfilgrastim (NEULASTA) 6 MG/0.6ML injection Inject 6 mg into the skin once.   Yes Historical Provider, MD  PRESCRIPTION MEDICATION etoposide (VEPESID) 100 mg in sodium chloride 0.9 % 250 mL chemo infusion 80 mg/m2  1.3 m2 (Treatment Plan Actual)  Once 08/21/2013   Yes Historical Provider, MD  PRESCRIPTION MEDICATION  CARBOplatin (PARAPLATIN) 280 mg in sodium chloride 0.9 % 100 mL chemo infusion 280 mg  Once 08/19/2013   Yes Historical Provider, MD  prochlorperazine (COMPAZINE) 10 MG tablet Take 1 tablet (10 mg total) by mouth every 6 (six) hours as needed for nausea or vomiting. 08/12/13  Yes Curt Bears, MD  protein supplement (RESOURCE BENEPROTEIN) POWD Take 6 g by mouth 3 (three) times daily with meals. 07/11/13  Yes Robbie Lis, MD  sucralfate (CARAFATE) 1 G tablet Take 1 tablet (1 g total) by mouth 4 (four) times daily -  with meals and at bedtime. 5 minutes before meal for esophagitis 07/18/13  Yes Lora Paula, MD  Wound Cleansers (RADIAPLEX EX) Apply 1 application topically 2 (two) times daily as needed (For back and chest).    Yes Historical Provider, MD   BP 102/56  Pulse 110  Temp(Src) 97.5 F (36.4 C) (Oral)  Resp 24  Ht 4\' 11"  (1.499 m)  Wt 92 lb 2.4 oz (41.8 kg)  BMI 18.60 kg/m2  SpO2 99% Vitals reviewed Physical Exam Physical Examination: General appearance - alert, cachetic appearing, and in no distress Mental status - alert, oriented to person, place, and time Eyes - no conjunctival injection, no scleral icterus Mouth - mucous membranes moist, pharynx normal without lesions Chest - clear to auscultation, no wheezes, rales or rhonchi, symmetric air entry Heart - normal rate, regular rhythm, normal S1, S2, no murmurs, rubs, clicks or gallops Abdomen - soft, nontender, nondistended, no masses or organomegaly Extremities - peripheral pulses normal, no pedal edema, no clubbing or cyanosis Skin - normal coloration and turgor, no rashes  ED Course  Procedures (including critical care time) Labs Review Labs Reviewed  CBC - Abnormal; Notable for the following:    WBC 0.2 (*)    RBC 2.54 (*)    Hemoglobin 7.6 (*)    HCT 21.0 (*)    MCHC 36.2 (*)    RDW 15.8 (*)    Platelets 8 (*)    All other components within normal limits  COMPREHENSIVE METABOLIC PANEL - Abnormal; Notable  for the following:    Sodium 130 (*)    Potassium 2.3 (*)    Chloride 90 (*)    Calcium 7.8 (*)    Total Protein 5.5 (*)    Albumin 2.0 (*)    Alkaline Phosphatase 348 (*)    GFR calc non Af Amer 90 (*)    All other components within normal limits  URINALYSIS, ROUTINE W REFLEX MICROSCOPIC - Abnormal; Notable for the following:    Protein, ur 30 (*)    All other components within normal limits  BASIC METABOLIC PANEL - Abnormal; Notable for the following:    Sodium 129 (*)    CO2 18 (*)    Creatinine, Ser 0.47 (*)    Calcium 7.1 (*)  All other components within normal limits  CBC - Abnormal; Notable for the following:    WBC 0.2 (*)    RBC 2.11 (*)    Hemoglobin 6.1 (*)    HCT 17.5 (*)    RDW 16.3 (*)    Platelets <5 (*)    All other components within normal limits  CBC - Abnormal; Notable for the following:    WBC 0.4 (*)    RBC 3.13 (*)    Hemoglobin 9.5 (*)    HCT 26.3 (*)    MCHC 36.1 (*)    Platelets 75 (*)    All other components within normal limits  DIFFERENTIAL - Abnormal; Notable for the following:    Neutro Abs 0.2 (*)    Lymphs Abs 0.2 (*)    Monocytes Absolute 0.0 (*)    All other components within normal limits  BASIC METABOLIC PANEL - Abnormal; Notable for the following:    Sodium 132 (*)    Potassium 3.6 (*)    Chloride 94 (*)    CO2 18 (*)    Glucose, Bld 206 (*)    Calcium 7.6 (*)    GFR calc non Af Amer 88 (*)    All other components within normal limits  IRON AND TIBC - Abnormal; Notable for the following:    Iron 137 (*)    TIBC 165 (*)    Saturation Ratios 83 (*)    UIBC 28 (*)    All other components within normal limits  FERRITIN - Abnormal; Notable for the following:    Ferritin 4395 (*)    All other components within normal limits  CBC WITH DIFFERENTIAL - Abnormal; Notable for the following:    WBC 0.3 (*)    RBC 3.41 (*)    Hemoglobin 10.1 (*)    HCT 28.7 (*)    Platelets 93 (*)    Monocytes Relative 13 (*)    Neutro Abs 0.2  (*)    Lymphs Abs 0.1 (*)    Monocytes Absolute 0.0 (*)    All other components within normal limits  COMPREHENSIVE METABOLIC PANEL - Abnormal; Notable for the following:    Sodium 131 (*)    CO2 15 (*)    Glucose, Bld 159 (*)    Calcium 7.7 (*)    Total Protein 5.5 (*)    Albumin 2.2 (*)    AST 40 (*)    Alkaline Phosphatase 350 (*)    Total Bilirubin 2.6 (*)    All other components within normal limits  TROPONIN I - Abnormal; Notable for the following:    Troponin I 0.97 (*)    All other components within normal limits  MAGNESIUM - Abnormal; Notable for the following:    Magnesium 0.8 (*)    All other components within normal limits  TROPONIN I - Abnormal; Notable for the following:    Troponin I 0.58 (*)    All other components within normal limits  TROPONIN I - Abnormal; Notable for the following:    Troponin I 0.62 (*)    All other components within normal limits  DIFFERENTIAL - Abnormal; Notable for the following:    Monocytes Relative 18 (*)    Neutro Abs 0.6 (*)    Lymphs Abs 0.3 (*)    All other components within normal limits  CBC - Abnormal; Notable for the following:    WBC 1.1 (*)    RBC 3.59 (*)    Hemoglobin 10.8 (*)  HCT 30.5 (*)    RDW 15.9 (*)    Platelets 75 (*)    All other components within normal limits  DIFFERENTIAL - Abnormal; Notable for the following:    Monocytes Relative 15 (*)    Basophils Relative 3 (*)    Neutro Abs 1.1 (*)    Lymphs Abs 0.4 (*)    All other components within normal limits  CBC - Abnormal; Notable for the following:    WBC 1.9 (*)    RBC 3.32 (*)    Hemoglobin 9.9 (*)    HCT 28.9 (*)    RDW 17.3 (*)    Platelets 44 (*)    All other components within normal limits  BASIC METABOLIC PANEL - Abnormal; Notable for the following:    Potassium 6.4 (*)    CO2 17 (*)    Calcium 8.2 (*)    GFR calc non Af Amer 71 (*)    GFR calc Af Amer 82 (*)    All other components within normal limits  PRO B NATRIURETIC PEPTIDE -  Abnormal; Notable for the following:    Pro B Natriuretic peptide (BNP) 50074.0 (*)    All other components within normal limits  BASIC METABOLIC PANEL - Abnormal; Notable for the following:    Potassium 5.9 (*)    GFR calc non Af Amer 63 (*)    GFR calc Af Amer 73 (*)    All other components within normal limits  MRSA PCR SCREENING  LIPASE, BLOOD  URINE MICROSCOPIC-ADD ON  OCCULT BLOOD X 1 CARD TO LAB, STOOL  DIFFERENTIAL  TYPE AND SCREEN  ABO/RH  PREPARE RBC (CROSSMATCH)  PREPARE PLATELET PHERESIS  PREPARE RBC (CROSSMATCH)  PREPARE PLATELET PHERESIS    Imaging Review No results found.   EKG Interpretation   Date/Time:  Saturday Aug 30 2013 14:00:06 EDT Ventricular Rate:  107 PR Interval:  156 QRS Duration: 91 QT Interval:  357 QTC Calculation: 476 R Axis:     Text Interpretation:  Sinus tachycardia Atrial premature complex Probable  left atrial enlargement RSR' in V1 or V2, right VCD or RVH ED PHYSICIAN  INTERPRETATION AVAILABLE IN [facility] Confirmed by TEST, Record  ([id number]) on 09/01/2013 7:23:49 AM      MDM   Final diagnoses:  Hyponatremia  Metastatic lung carcinoma  Pancytopenia  Protein-calorie malnutrition, severe  Hypokalemia  Non-small cell cancer of right lung  Lung mass  Acute respiratory failure  Acute systolic CHF (congestive heart failure)  PAF (paroxysmal atrial fibrillation)  Sinus tachycardia    Pt presenting with c/o nausea and fatigue associated with difficulty eating and drinking. Pt found in ED workup to be hyponatremic, hypokalemic with dehydration, she appears cachectic on exam. Pt treated with IV potassium, not able to swallow large potassium pills po, also given NS infusion.  Admitted to triad for further management.      Threasa Beards, MD 09/03/13 [phone]

## 2013-08-30 NOTE — H&P (Signed)
[facility] History and Physical  Alejandra Hunt JEH:[id number] DOB: 1940/09/14 DOA: 09/07/2013  Referring physician: Alfonzo Beers PCP: [name], MD  Specialists: Dr Julien Nordmann  Chief Complaint: weakness  HPI: Rachael Wall is a 73 y.o. 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 She has had a craniotomy and started chemo last week. She has had a poor appetite and has had to be given IVF multiple times over the past week. She comes in now for severe weakness and is found to be hypokalemic and pancytopenic. She will be admitted for K replacement, blood transfusion and IVF.   General: The patient denies anorexia, fever, weight loss- has had some nose bleeds over the past few days and has some blood trickling out her nose now Cardiac: Denies chest pain, syncope, palpitations,+  pedal edema yesterday- took a one time dose of Lasix Respiratory: Denies dyspnea on exertion, cough, shortness of breath, wheezing  GI: Denies severe  indigestion/heartburn, abdominal pain, nausea, + vomiting- one episode yesterday and some loose stools GU: Denies hematuria, incontinence, dysuria  Musculoskeletal: Denies muscle pain Skin: Denies suspicious skin lesions Neurologic: Denies focal weakness or numbness, change in vision  Past Medical History  Diagnosis Date  . Diverticulitis   . 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 Suboccipital craniectomy cervical laminectomy N/A 07/07/2013    Procedure: Left suboccipital craniectomy for resection of tumor ;  Surgeon: Ophelia Charter, MD;  Location: [place] [facility];  Service: Neurosurgery;  Laterality: N/A;   Social History:  reports that she quit smoking about 6 months ago. She does not have any smokeless tobacco history on file. She reports that she does not drink alcohol or use illicit drugs. Lives at home with her  daughter Needs help ADLs  No Known Allergies  History reviewed. No pertinent family history.    Prior to Admission medications   Medication Sig Start Date End Date Taking? Authorizing Provider  acetaminophen (TYLENOL) 500 MG tablet Take 500 mg by mouth every 6 (six) hours as needed for mild pain.   Yes Historical Provider, MD  allopurinol (ZYLOPRIM) 100 MG tablet Take 1 tablet (100 mg total) by mouth 2 (two) times daily. 08/12/13  Yes Curt Bears, MD  budesonide-formoterol [facility]) 160-4.5 MCG/ACT inhaler Inhale 2 puffs into the lungs 2 (two) times daily.    Yes Historical Provider, MD  feeding supplement, ENSURE COMPLETE, (ENSURE COMPLETE) LIQD Take 237 mLs by mouth 2 (two) times daily between meals. 07/11/13  Yes Robbie Lis, MD  fluconazole (DIFLUCAN) 200 MG tablet Take 1 tablet (200 mg total) by mouth daily. 08/26/13  Yes Curt Bears, MD  furosemide (LASIX) 20 MG tablet Take 1 tablet (20 mg total) by mouth daily. Every other day, if needed for ankle swelling. 07/28/13  Yes Lora Paula, MD  KLOR-CON M20 20 MEQ tablet Take 40 mEq by mouth daily.  08/26/13  Yes Historical Provider, MD  lactose free nutrition (BOOST PLUS) LIQD Take 237 mLs by mouth 3 (three) times daily between meals. 07/11/13  Yes Robbie Lis, MD  levofloxacin (LEVAQUIN) 500 MG tablet Take 1 tablet (500 mg total) by mouth daily. 08/26/13  Yes Curt Bears, MD  oxyCODONE-acetaminophen (PERCOCET/ROXICET) 5-325 MG per tablet Take 1/4 tablets by mouth every 6 (six) hours as needed for severe pain. 08/04/13  Yes Lora Paula, MD  pantoprazole (PROTONIX) 40 MG  tablet Take 40 mg by mouth 2 (two) times daily.   Yes Historical Provider, MD  pegfilgrastim (NEULASTA) 6 MG/0.6ML injection Inject 6 mg into the skin once.   Yes Historical Provider, MD  PRESCRIPTION MEDICATION etoposide (VEPESID) 100 mg in sodium chloride 0.9 % 250 mL chemo infusion 80 mg/m2  1.3 m2 (Treatment Plan Actual)  Once 08/21/2013   Yes Historical  Provider, MD  PRESCRIPTION MEDICATION CARBOplatin (PARAPLATIN) 280 mg in sodium chloride 0.9 % 100 mL chemo infusion 280 mg  Once 08/19/2013   Yes Historical Provider, MD  prochlorperazine (COMPAZINE) 10 MG tablet Take 1 tablet (10 mg total) by mouth every 6 (six) hours as needed for nausea or vomiting. 08/12/13  Yes Curt Bears, MD  protein supplement (RESOURCE BENEPROTEIN) POWD Take 6 g by mouth 3 (three) times daily with meals. 07/11/13  Yes Robbie Lis, MD  sucralfate (CARAFATE) 1 G tablet Take 1 tablet (1 g total) by mouth 4 (four) times daily -  with meals and at bedtime. 5 minutes before meal for esophagitis 07/18/13  Yes Lora Paula, MD  Wound Cleansers (RADIAPLEX EX) Apply 1 application topically 2 (two) times daily as needed (For back and chest).    Yes Historical Provider, MD     Physical Exam: Filed Vitals:   08/28/2013 1234 09/07/2013 1454  BP: 113/65 123/71  Pulse: 133   Temp: 98 F (36.7 C)   TempSrc: Oral   Resp: 18 30  SpO2: 97% 95%     General: AAO x 3, cachectic, pale female sitting up in bed.  HEENT: Normocephalic and Atraumatic, Mucous membranes pink                PERRLA; EOM intact; No scleral icterus,                 Nares: Patent, Oropharynx: Clear, Fair Dentition                 Neck: FROM, no cervical lymphadenopathy, thyromegaly, carotid bruit or JVD;  Breasts: deferred CHEST WALL: No tenderness  CHEST: Normal respiration, clear to auscultation bilaterally  HEART: Regular rate and rhythm; no murmurs rubs or gallops - tachycardic with HR 107 at rest BACK: No kyphosis or scoliosis; no CVA tenderness  ABDOMEN: Positive Bowel Sounds, soft, non-tender; no masses, no organomegaly Rectal Exam: deferred EXTREMITIES: No cyanosis, clubbing, or edema Genitalia: not examined  SKIN:  no rash or ulceration  CNS: Alert and Oriented x 4, Nonfocal exam, CN 2-12 intact  Labs on Admission:  Basic Metabolic Panel:  Recent Labs Lab 08/26/13 0907 09/14/2013 1252   NA 134* 130*  K 2.9* 2.3*  CL  --  90*  CO2 25 24  GLUCOSE 110 79  BUN 20.9 11  CREATININE 0.7 0.56  CALCIUM 8.9 7.8*   Liver Function Tests:  Recent Labs Lab 08/26/13 0907 08/29/2013 1252  AST 50* 22  ALT 45 24  ALKPHOS 568* 348*  BILITOT 1.09 1.0  PROT 5.5* 5.5*  ALBUMIN 2.2* 2.0*    Recent Labs Lab 09/15/2013 1252  LIPASE 22   No results found for this basename: AMMONIA,  in the last 168 hours CBC:  Recent Labs Lab 08/26/13 0907 09/13/2013 1252  WBC < 0.2* 0.2*  HGB 9.6* 7.6*  HCT 27.6* 21.0*  MCV 82.9 82.7  PLT 73* 8*   Cardiac Enzymes: No results found for this basename: CKTOTAL, CKMB, CKMBINDEX, TROPONINI,  in the last 168 hours  BNP (last 3 results) No  results found for this basename: PROBNP,  in the last 8760 hours CBG: No results found for this basename: GLUCAP,  in the last 168 hours  Radiological Exams on Admission: No results found.  EKG: Independently reviewed. Sinus tachycardia  Assessment/Plan Principal Problem:   Pancytopenia - will give 2 U PRBC for the anemia - she has received Neulasta already - follow Platelets -currently has a mild nose bleed-if excessive bleeding, will need a platelet transfusion  Active Problems:   Hyponatremia - cont NS IV - check Urine sodium and Urine osm    Hypokalemia - follow on tele and replace agressively - check Mg+ levels    Protein-calorie malnutrition, severe - encouraged to eat and drink despite not having an appetite    Non-small cell cancer of right lung - completed first round of chemo last week - will place  Dr Julien Nordmann as Optometrist in [facility]    Consulted: have placed Dr Inda Merlin as a Optometrist   Code Status: Full code- pt does not want to remain on life support if there she has a poor prognosis  Family Communication: daughter in room  Disposition Plan: to be determined   Time spent: 77 min  Debbe Odea, MD [facility]  If 7PM-7AM, please contact  night-coverage [email or web address] 09/04/2013, 4:56 PM

## 2013-08-31 DIAGNOSIS — D61810 Antineoplastic chemotherapy induced pancytopenia: Principal | ICD-10-CM

## 2013-08-31 DIAGNOSIS — E860 Dehydration: Secondary | ICD-10-CM

## 2013-08-31 DIAGNOSIS — T451X5A Adverse effect of antineoplastic and immunosuppressive drugs, initial encounter: Principal | ICD-10-CM

## 2013-08-31 DIAGNOSIS — C3490 Malignant neoplasm of unspecified part of unspecified bronchus or lung: Secondary | ICD-10-CM

## 2013-08-31 DIAGNOSIS — C7B8 Other secondary neuroendocrine tumors: Secondary | ICD-10-CM

## 2013-08-31 DIAGNOSIS — E876 Hypokalemia: Secondary | ICD-10-CM

## 2013-08-31 DIAGNOSIS — C787 Secondary malignant neoplasm of liver and intrahepatic bile duct: Secondary | ICD-10-CM

## 2013-08-31 DIAGNOSIS — C7A090 Malignant carcinoid tumor of the bronchus and lung: Secondary | ICD-10-CM

## 2013-08-31 LAB — PREPARE RBC (CROSSMATCH)

## 2013-08-31 LAB — CBC
HCT: 17.5 % — ABNORMAL LOW (ref 36.0–46.0)
Hemoglobin: 6.1 g/dL — CL (ref 12.0–15.0)
MCH: 28.9 pg (ref 26.0–34.0)
MCHC: 34.9 g/dL (ref 30.0–36.0)
MCV: 82.9 fL (ref 78.0–100.0)
RBC: 2.11 MIL/uL — ABNORMAL LOW (ref 3.87–5.11)
RDW: 16.3 % — AB (ref 11.5–15.5)
WBC: 0.2 10*3/uL — CL (ref 4.0–10.5)

## 2013-08-31 MED ORDER — FUROSEMIDE 10 MG/ML IJ SOLN
20.0000 mg | Freq: Once | INTRAMUSCULAR | Status: AC
  Administered 2013-08-31: 20 mg via INTRAVENOUS
  Filled 2013-08-31: qty 2

## 2013-08-31 MED ORDER — LORAZEPAM 0.5 MG PO TABS
0.5000 mg | ORAL_TABLET | Freq: Once | ORAL | Status: AC
  Administered 2013-08-31: 0.5 mg via ORAL
  Filled 2013-08-31: qty 1

## 2013-08-31 MED ORDER — METHYLPREDNISOLONE SODIUM SUCC 40 MG IJ SOLR
30.0000 mg | Freq: Once | INTRAMUSCULAR | Status: DC
  Filled 2013-08-31: qty 0.75

## 2013-08-31 MED ORDER — ACETAMINOPHEN 325 MG PO TABS
650.0000 mg | ORAL_TABLET | Freq: Once | ORAL | Status: AC
  Administered 2013-08-31: 650 mg via ORAL
  Filled 2013-08-31: qty 2

## 2013-08-31 MED ORDER — LEVOFLOXACIN 500 MG PO TABS
500.0000 mg | ORAL_TABLET | Freq: Every day | ORAL | Status: AC
  Administered 2013-08-31 – 2013-09-06 (×7): 500 mg via ORAL
  Filled 2013-08-31 (×7): qty 1

## 2013-08-31 MED ORDER — FUROSEMIDE 10 MG/ML IJ SOLN: 20.0000 mg | Freq: Once | INTRAMUSCULAR | Status: DC

## 2013-08-31 MED ORDER — METHYLPREDNISOLONE SODIUM SUCC 40 MG IJ SOLR
30.0000 mg | Freq: Once | INTRAMUSCULAR | Status: AC
  Administered 2013-08-31: 30 mg via INTRAVENOUS
  Filled 2013-08-31: qty 0.75

## 2013-08-31 MED ORDER — LEVALBUTEROL HCL 0.63 MG/3ML IN NEBU
0.6300 mg | INHALATION_SOLUTION | Freq: Four times a day (QID) | RESPIRATORY_TRACT | Status: DC | PRN
  Administered 2013-08-31 – 2013-09-07 (×3): 0.63 mg via RESPIRATORY_TRACT
  Filled 2013-08-31 (×3): qty 3

## 2013-08-31 MED ORDER — ACETAMINOPHEN 325 MG PO TABS: 650.0000 mg | ORAL_TABLET | Freq: Once | ORAL | Status: DC

## 2013-08-31 NOTE — Consult Note (Signed)
[place] [phone]   [phone]  CONSULT NOTE  REFERRING PHYSICIAN: Dr. 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   HPI:  Rachael Wall is a 72 years old pleasant female with recently diagnosed metastatic poorly differentiated last large cell high-grade neuro  endocrine carcinoma involving the chest, abdomen, liver, bone, pancreas and brain. She underwent left suboccipital craniectomy for posterior resection of cerebellar tumor using microdissection on 07/07/2013.  she also received palliative radiotherapy to the surgical resection cavity in the brainand right upper lobe lung mass for relief of SVC symptoms under the care of Dr. Tammi Klippel.   she has received Neulasta injection  after cycle 1 chemotherapy with  carboplatinum and etoposide.   she was evaluated by Dr. Julien Nordmann, on 08/26/2013 in the clinic and received IV fluids for dehydration and  empiric antibiotic treatment with Levaquin and Diflucan was prescribed. She is admitted to the [facility] long hospital yesterday with weakness , pancytopenia and electrolyte imbalance. At present she is in the process of receiving platelet and packed RBCs transfusion. She feels very tired and complains of decrease in appetite and mild nausea. Also complains of constipation no blood in the stool and no blood in the urine. Denies any fever or, shortness of breath, chest pain, headaches or blurred vision   Past Medical History  Diagnosis Date  . Diverticulitis   . 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 Suboccipital craniectomy cervical laminectomy N/A 07/07/2013    Procedure: Left suboccipital craniectomy for resection of tumor ;  Surgeon: Ophelia Charter, MD;  Location: [place] [facility];  Service:  Neurosurgery;  Laterality: N/A;    History reviewed. No pertinent family history.  Social History History  Substance Use Topics  . Smoking status: Former Smoker    Quit date: 02/22/2013  . Smokeless tobacco: Not on file  . Alcohol Use: No    No Known Allergies  Current Facility-Administered Medications  Medication Dose Route Frequency Provider Last Rate Last Dose  . 0.9 % NaCl with KCl 40 mEq / L  infusion   Intravenous Continuous Debbe Odea, MD 100 mL/hr at 08/31/13 1539    . acetaminophen (TYLENOL) tablet 500 mg  500 mg Oral Q6H PRN Debbe Odea, MD      . allopurinol (ZYLOPRIM) tablet 100 mg  100 mg Oral BID Debbe Odea, MD   100 mg at 08/31/13 1022  . alum & mag hydroxide-simeth (MAALOX/MYLANTA) 200-200-20 MG/5ML suspension 30 mL  30 mL Oral Q6H PRN Debbe Odea, MD      . antiseptic oral rinse (BIOTENE) solution 15 mL  15 mL Mouth Rinse q12n4p Debbe Odea, MD   15 mL at 08/31/13 1606  . budesonide-formoterol (SYMBICORT) 160-4.5 MCG/ACT inhaler 2 puff  2 puff Inhalation BID Debbe Odea, MD   2 puff at 08/31/13 0746  . feeding supplement (ENSURE COMPLETE) (ENSURE COMPLETE) liquid 237 mL  237 mL Oral BID BM Saima Rizwan, MD      . fluconazole (DIFLUCAN) tablet 200 mg  200 mg Oral Daily Debbe Odea, MD   200 mg at 08/31/13 1024  . lactose free nutrition (BOOST PLUS) liquid 237 mL  237 mL Oral TID BM Debbe Odea, MD   237 mL at 08/31/13 1021  . levalbuterol (XOPENEX) nebulizer solution 0.63  mg  0.63 mg Nebulization Q6H PRN Dianne Dun, NP   0.63 mg at 08/31/13 1530  . levofloxacin (LEVAQUIN) tablet 500 mg  500 mg Oral Daily Erline Hau, MD   500 mg at 08/31/13 1026  . ondansetron (ZOFRAN) tablet 4 mg  4 mg Oral Q4H PRN Debbe Odea, MD       Or  . ondansetron (ZOFRAN) injection 4 mg  4 mg Intravenous Q6H PRN Debbe Odea, MD      . pantoprazole (PROTONIX) EC tablet 40 mg  40 mg Oral BID Debbe Odea, MD   40 mg at 08/31/13 1021  . polyethylene glycol  (MIRALAX / GLYCOLAX) packet 17 g  17 g Oral Daily Dianne Dun, NP   17 g at 08/31/13 1022  . potassium chloride SA (K-DUR,KLOR-CON) CR tablet 40 mEq  40 mEq Oral Once Threasa Beards, MD      . prochlorperazine (COMPAZINE) tablet 10 mg  10 mg Oral Q6H PRN Debbe Odea, MD      . protein supplement (RESOURCE BENEPROTEIN) powder 6 g  1 scoop Oral TID WC Debbe Odea, MD   6 g at 08/31/13 0900  . sucralfate (CARAFATE) tablet 1 g  1 g Oral TID WC & HS Debbe Odea, MD   1 g at 09/11/2013 2108    Review of Systems  Pertinent symptoms as mentioned in history of present illness  Physical Exam  GENERAL:alert and oriented x3,  no distress, well nourished and well developed SKIN:  Purpura noted on the chest HEAD: Normocephalic, atraumatic EYES: PERRLA, EOMI, Conjunctiva are pink and non-injected, sclera clear EARS: External ears normal OROPHARYNX:no erythema, lips, buccal mucosa, and tongue normal and mucous membranes are moist  NECK: supple, no adenopathy, no JVD, no stridor, non-tender LYMPH:  no palpable lymphadenopathy, no hepatosplenomegaly LUNGS: clear to auscultation , coarse sounds heard HEART: regular rate & rhythm ABDOMEN: soft, obese and normal bowel sounds .EXTREMITIES:no edema, no clubbing and no cyanosis  NEURO: alert & oriented x 3 with fluent speech, no focal motor/sensory deficits, gait normal   LABORATORY DATA: Lab Results  Component Value Date   WBC 0.2* 08/31/2013   HGB 6.1* 08/31/2013   HCT 17.5* 08/31/2013   MCV 82.9 08/31/2013   PLT <5* 08/31/2013    @LASTCHEM @  RADIOGRAPHIC STUDIES: Nm Pet Image Initial (pi) Skull Base To Thigh  08/08/2013   CLINICAL DATA:  Initial Treatment strategy for Lung cancer.  EXAM: NUCLEAR MEDICINE PET SKULL BASE TO THIGH  TECHNIQUE: Six mCi F-18 FDG was injected intravenously. Full-ring PET imaging was performed from the skull base to thigh after the radiotracer. CT data was obtained and used for attenuation correction and  anatomic localization.  FASTING BLOOD GLUCOSE:  Value: 74 mg/dl  COMPARISON:  None.  FINDINGS: NECK  Increased FDG uptake is associated with posterior cervical lymph nodes. Index lymph node on the left has an SUV max equal to 4.7. Hypermetabolic bilateral supraclavicular lymph nodes are also present. Left posterior supraclavicular lymph node has an SUV max equal to 12.1.  CHEST  Multi lobulated mass in the right lower lobe is identified compatible with primary lung neoplasm. This measures approximately 2.3 cm and has an SUV max equal to 12.9. Subpleural mass within the posterior right lower lobe measure 5.1 and has an SUV max equal to 16. Multiple smaller hypermetabolic nodules are scattered within the right lower lobe and likely indicate transbronchial spread of tumor. Hypermetabolic bilateral mediastinal lymph node metastasis are identified.  Index right paratracheal lymph node measures 3.4 cm and has an SUV max equal to 4.4.  ABDOMEN/PELVIS  Large, multi focal liver metastasis are identified. Index mass is in the inferior right hepatic lobe measuring 8.4 cm. The SUV max is equal to 16. Multiple hypermetabolic soft tissue nodules are scattered throughout the abdominal and pelvic cavity. Index nodule within the left pericolic gutter measures 2.1 cm and has an SUV max equal to 16.3. Right pelvic sidewall nodule has an SUV max equal to 5.8. Hypermetabolic upper abdominal lymph nodes are identified. Gastrohepatic ligament lymph node measures 1.2 cm and has an SUV max equal to 18.8. Multiple hypermetabolic lesions are identified within the tail of pancreas. Index lesion has an SUV max equal to 10.2 advanced calcified atherosclerotic disease affects the abdominal aorta. There is no aneurysm.  SKELETON  Widespread metastatic disease is identified within the subcutaneous soft tissues of the body wall. Index nodule within the right ventral chest wall has an SUV max equal to 6.3. Within the right buttock region has an SUV  max equal to 10.1. Hypermetabolic bone metastasis are identified. Lesion within the proximal right humerus has an SUV max equal to 4.6, image 17. Lesion within the posterior aspect of the right second rib has an SUV max to 8.1.  IMPRESSION: 1. Findings are consistent with widespread metastatic primary bronchogenic carcinoma. The primary lung neoplasm is likely in the right lower lobe. 2. Hypermetabolic, bilateral mediastinal lymph node metastasis. 3. Extensive liver metastasis 4. Multi focal bone metastasis 5. Extensive soft tissue metastasis to the body wall. 6. Multi focal hypermetabolic lesions throughout the abdominal and pelvic cavity. 7. Suspect pancreas metastasis.   Electronically Signed   By: Kerby Moors M.D.   On: 08/08/2013 15:30     ASSESSMENT/PLAN: Recently diagnosed metastatic poorly differentiated last large cell high-grade neuroendocrine carcinoma involving the chest, abdomen, liver, bone, pancreas and brain.status post left suboccipital craniectomy followed by palliative radiotherapy to the surgical resection cavity in the brain and right upper lobe lung mass for relief of SVC symptoms. She also received cycle 1 chemotherapy with carboplatinum and etoposide followed by Neulasta therapy.  Now she is admitted to [facility] long hospital with severe pancytopenia most likely secondary to chemotherapy induced and electrolyte imbalance Continue 2 units of platelet transfusion and 2 units of packed RBC transfusions . Monitor posttransfusion CBC. Maintain hemoglobin of 8 g/dL and platelets count of more than 20000(nose bleeds). Rpt CBCs once every 12 hours.  F/u stool occult and iron indices ordered. Continue neutropenic precautions Continue empiric antibiotic and antifungal treatment. Electrolyte correction is in progress  Thank you so much for allowing me to participate in the care of Rachael Wall. We will continue to follow up the patient with you and assist in her care.   [facility]  08/31/2013, 6:38 PM

## 2013-08-31 NOTE — Progress Notes (Signed)
Pt c/o SOB. VS obtain respirations 34, HR 128, O2 sat 96% on RA, and all other VS WNL. Pt noted to have some expiratory wheezes upon auscultation. Pt also stated she is experiencing anxiety. Pt placed on 2L O2 for comfort. Fredirick Maudlin, NP notified and order rec'd for PRN nebulizer treatments and a one time dose of PO ativan. Will continue to monitor.

## 2013-08-31 NOTE — Progress Notes (Addendum)
CRITICAL VALUE ALERT  Critical value received:  Platelets <5   Date of notification:  08/31/2013  Time of notification:  0605  Critical value read back:yes  Nurse who received alert:  Candis Shine  MD notified (1st page):  Fredirick Maudlin, NP  Time of first page:  0615  MD notified (2nd page):  Time of second page:  Responding MD:  Fredirick Maudlin, NP  Time MD responded:  7858

## 2013-08-31 NOTE — Progress Notes (Signed)
CRITICAL VALUE ALERT  Critical value received:  Hbg 6.1   Date of notification:  08/31/2013  Time of notification:  0605  Critical value read back:yes  Nurse who received alert:  Candis Shine   MD notified (1st page):  Fredirick Maudlin  Time of first page:  0615  MD notified (2nd page):  Time of second page:  Responding MD:  Fredirick Maudlin  Time MD responded:  3790

## 2013-08-31 NOTE — Progress Notes (Signed)
CRITICAL VALUE ALERT  Critical value received: WBC 0.2  Date of notification:  08/31/2013   Time of notification:  0605   Critical value read back:yes  Nurse who received alert:  Dreama Saa   MD notified (1st page):  Fredirick Maudlin, NP   Time of first page:  0615   MD notified (2nd page):  Time of second page:  Responding MD:  Fredirick Maudlin, NP  Time MD responded:  2229

## 2013-08-31 NOTE — Progress Notes (Signed)
[facility] PROGRESS NOTE  Rachael Wall NLG:[id number] DOB: Feb 26, 1941 DOA: 09/09/2013 PCP: [name], MD  Assessment/Plan: Severe Pancytopenia, Chemotherapy-Induced -5/10: WBC 0.2, Hb 6.1, Plt <5. -Will receive 2 units of PRBCs and 2 units of pheresed platelets today. -Continue neutropenic precautions. -Has been started on levaquin for prophylaxis (thankfully not febrile). -Have discussed with heme-onc on call, who will see patient in consultation today. -Nosebleed seems to have ceased for now. -Received Neulasta after he last chemo, per heme-onc no need to repeat.  [place] Carcinoma on Lung -Continue OP management with Oncology. (Dr. Julien Nordmann).  Hypokalemia -Repleted  Hyponatremia -Continue IVF.  Severe Protein-Caloric Malnutrition -Nutrition consult. -Continue to encourage PO intake.  Code Status: DNR Family Communication: Patient only  Disposition Plan: To be determined   Consultants:  Heme/Onc   Antibiotics:  Levaquin   Subjective: No complaints other than extreme fatigue.  Objective: Filed Vitals:   08/31/13 1115 08/31/13 1215 08/31/13 1250 08/31/13 1315  BP: 112/97 137/77 140/78 147/81  Pulse: 107 109 110 105  Temp: 99.3 F (37.4 C) 98.2 F (36.8 C) 97.8 F (36.6 C) 97.9 F (36.6 C)  TempSrc: Oral Oral Oral Oral  Resp: 24 24 24 24   Height:      Weight:      SpO2: 100% 100% 99% 96%    Intake/Output Summary (Last 24 hours) at 08/31/13 1406 Last data filed at 08/31/13 1215  Gross per 24 hour  Intake 1580.83 ml  Output      0 ml  Net 1580.83 ml   Filed Weights   09/20/2013 1727  Weight: 41.8 kg (92 lb 2.4 oz)    Exam:   General:  AA Ox3  Cardiovascular: RRR   Respiratory: CTA B  Abdomen: S/NT/ND/+BS  Extremities: no C/C/E   Neurologic:  Non-focal  Data Reviewed: Basic Metabolic Panel:  Recent Labs Lab 08/26/13 0907 08/27/2013 1252 08/31/13 0510  NA 134* 130* 129*  K 2.9* 2.3* 4.1  CL  --  90* 98  CO2 25  24 18*  GLUCOSE 110 79 76  BUN 20.9 11 8   CREATININE 0.7 0.56 0.47*  CALCIUM 8.9 7.8* 7.1*   Liver Function Tests:  Recent Labs Lab 08/26/13 0907 08/24/2013 1252  AST 50* 22  ALT 45 24  ALKPHOS 568* 348*  BILITOT 1.09 1.0  PROT 5.5* 5.5*  ALBUMIN 2.2* 2.0*    Recent Labs Lab 09/01/2013 1252  LIPASE 22   No results found for this basename: AMMONIA,  in the last 168 hours CBC:  Recent Labs Lab 08/26/13 0907 08/25/2013 1252 08/31/13 0510  WBC < 0.2* 0.2* 0.2*  HGB 9.6* 7.6* 6.1*  HCT 27.6* 21.0* 17.5*  MCV 82.9 82.7 82.9  PLT 73* 8* <5*   Cardiac Enzymes: No results found for this basename: CKTOTAL, CKMB, CKMBINDEX, TROPONINI,  in the last 168 hours BNP (last 3 results) No results found for this basename: PROBNP,  in the last 8760 hours CBG: No results found for this basename: GLUCAP,  in the last 168 hours  Recent Results (from the past 240 hour(s))  TECHNOLOGIST REVIEW     Status: None   Collection Time    08/26/13  9:07 AM      Result Value Ref Range Status   Technologist Review few lymphs, oc mono   Final     Studies: No results found.  Scheduled Meds: . allopurinol  100 mg Oral BID  . antiseptic oral rinse  15 mL Mouth Rinse q12n4p  .  budesonide-formoterol  2 puff Inhalation BID  . feeding supplement (ENSURE COMPLETE)  237 mL Oral BID BM  . fluconazole  200 mg Oral Daily  . lactose free nutrition  237 mL Oral TID BM  . levofloxacin  500 mg Oral Daily  . pantoprazole  40 mg Oral BID  . polyethylene glycol  17 g Oral Daily  . potassium chloride  40 mEq Oral Once  . protein supplement  1 scoop Oral TID WC  . sucralfate  1 g Oral TID WC & HS   Continuous Infusions: . 0.9 % NaCl with KCl 40 mEq / L 100 mL/hr at 08/31/13 6433    Principal Problem:   Pancytopenia Active Problems:   Hyponatremia   Protein-calorie malnutrition, severe   Non-small cell cancer of right lung   Hypokalemia    Time spent: 35 minutes. Greater than 50% of this time was  spent in direct contact with the patient coordinating care.    [facility] Pager [phone]  If 7PM-7AM, please contact night-coverage at [email or web address], password [facility] 08/31/2013, 2:06 PM  LOS: 1 day

## 2013-09-01 ENCOUNTER — Inpatient Hospital Stay (HOSPITAL_COMMUNITY): Payer: Medicare PPO

## 2013-09-01 DIAGNOSIS — J9600 Acute respiratory failure, unspecified whether with hypoxia or hypercapnia: Secondary | ICD-10-CM

## 2013-09-01 DIAGNOSIS — R630 Anorexia: Secondary | ICD-10-CM

## 2013-09-01 DIAGNOSIS — R222 Localized swelling, mass and lump, trunk: Secondary | ICD-10-CM

## 2013-09-01 DIAGNOSIS — C7B8 Other secondary neuroendocrine tumors: Secondary | ICD-10-CM

## 2013-09-01 DIAGNOSIS — B370 Candidal stomatitis: Secondary | ICD-10-CM

## 2013-09-01 DIAGNOSIS — D702 Other drug-induced agranulocytosis: Secondary | ICD-10-CM

## 2013-09-01 DIAGNOSIS — R5081 Fever presenting with conditions classified elsewhere: Secondary | ICD-10-CM

## 2013-09-01 LAB — PREPARE PLATELET PHERESIS
UNIT DIVISION: 0
Unit division: 0

## 2013-09-01 LAB — CBC WITH DIFFERENTIAL/PLATELET
Basophils Absolute: 0 10*3/uL (ref 0.0–0.1)
Basophils Relative: 0 % (ref 0–1)
Eosinophils Absolute: 0 10*3/uL (ref 0.0–0.7)
Eosinophils Relative: 0 % (ref 0–5)
HCT: 28.7 % — ABNORMAL LOW (ref 36.0–46.0)
Hemoglobin: 10.1 g/dL — ABNORMAL LOW (ref 12.0–15.0)
Lymphocytes Relative: 35 % (ref 12–46)
Lymphs Abs: 0.1 10*3/uL — ABNORMAL LOW (ref 0.7–4.0)
MCH: 29.6 pg (ref 26.0–34.0)
MCHC: 35.2 g/dL (ref 30.0–36.0)
MCV: 84.2 fL (ref 78.0–100.0)
Monocytes Absolute: 0 10*3/uL — ABNORMAL LOW (ref 0.1–1.0)
Monocytes Relative: 13 % — ABNORMAL HIGH (ref 3–12)
Neutro Abs: 0.2 10*3/uL — ABNORMAL LOW (ref 1.7–7.7)
Neutrophils Relative %: 52 % (ref 43–77)
Platelets: 93 10*3/uL — ABNORMAL LOW (ref 150–400)
RBC: 3.41 MIL/uL — ABNORMAL LOW (ref 3.87–5.11)
RDW: 15.1 % (ref 11.5–15.5)
WBC: 0.3 10*3/uL — CL (ref 4.0–10.5)

## 2013-09-01 LAB — BASIC METABOLIC PANEL
BUN: 8 mg/dL (ref 6–23)
BUN: 12 mg/dL (ref 6–23)
CALCIUM: 7.6 mg/dL — AB (ref 8.4–10.5)
CO2: 18 meq/L — AB (ref 19–32)
CO2: 18 mEq/L — ABNORMAL LOW (ref 19–32)
CREATININE: 0.47 mg/dL — AB (ref 0.50–1.10)
Calcium: 7.1 mg/dL — ABNORMAL LOW (ref 8.4–10.5)
Chloride: 98 mEq/L (ref 96–112)
Chloride: 94 mEq/L — ABNORMAL LOW (ref 96–112)
Creatinine, Ser: 0.6 mg/dL (ref 0.50–1.10)
GFR calc Af Amer: >90 mL/min (ref >90)
GFR calc non Af Amer: >90 mL/min (ref >90)
GFR calc non Af Amer: 88 mL/min — ABNORMAL LOW (ref >90)
GLUCOSE: 76 mg/dL (ref 70–99)
Glucose, Bld: 206 mg/dL — ABNORMAL HIGH (ref 70–99)
Potassium: 4.1 mEq/L (ref 3.7–5.3)
Potassium: 3.6 mEq/L — ABNORMAL LOW (ref 3.7–5.3)
Sodium: 129 mEq/L — ABNORMAL LOW (ref 137–147)
Sodium: 132 mEq/L — ABNORMAL LOW (ref 137–147)

## 2013-09-01 LAB — IRON AND TIBC
IRON: 137 ug/dL — AB (ref 42–135)
Saturation Ratios: 83 % — ABNORMAL HIGH (ref 20–55)
TIBC: 165 ug/dL — ABNORMAL LOW (ref 250–470)
UIBC: 28 ug/dL — ABNORMAL LOW (ref 125–400)

## 2013-09-01 LAB — CBC
HCT: 26.3 % — ABNORMAL LOW (ref 36.0–46.0)
Hemoglobin: 9.5 g/dL — ABNORMAL LOW (ref 12.0–15.0)
MCH: 30.4 pg (ref 26.0–34.0)
MCHC: 36.1 g/dL — ABNORMAL HIGH (ref 30.0–36.0)
MCV: 84 fL (ref 78.0–100.0)
Platelets: 75 10*3/uL — ABNORMAL LOW (ref 150–400)
RBC: 3.13 MIL/uL — ABNORMAL LOW (ref 3.87–5.11)
RDW: 15.3 % (ref 11.5–15.5)
WBC: 0.4 10*3/uL — CL (ref 4.0–10.5)

## 2013-09-01 LAB — DIFFERENTIAL
BASOS ABS: 0 10*3/uL (ref 0.0–0.1)
Basophils Relative: 0 % (ref 0–1)
Eosinophils Absolute: 0 10*3/uL (ref 0.0–0.7)
Eosinophils Relative: 0 % (ref 0–5)
LYMPHS ABS: 0.2 10*3/uL — AB (ref 0.7–4.0)
Lymphocytes Relative: 46 % (ref 12–46)
MONO ABS: 0 10*3/uL — AB (ref 0.1–1.0)
Monocytes Relative: 8 % (ref 3–12)
NEUTROS PCT: 46 % (ref 43–77)
Neutro Abs: 0.2 10*3/uL — ABNORMAL LOW (ref 1.7–7.7)

## 2013-09-01 LAB — COMPREHENSIVE METABOLIC PANEL
ALT: 27 U/L (ref 0–35)
AST: 40 U/L — ABNORMAL HIGH (ref 0–37)
Albumin: 2.2 g/dL — ABNORMAL LOW (ref 3.5–5.2)
Alkaline Phosphatase: 350 U/L — ABNORMAL HIGH (ref 39–117)
BUN: 11 mg/dL (ref 6–23)
CO2: 15 mEq/L — ABNORMAL LOW (ref 19–32)
Calcium: 7.7 mg/dL — ABNORMAL LOW (ref 8.4–10.5)
Chloride: 97 mEq/L (ref 96–112)
Creatinine, Ser: 0.54 mg/dL (ref 0.50–1.10)
GFR calc Af Amer: >90 mL/min (ref >90)
GFR calc non Af Amer: >90 mL/min (ref >90)
Glucose, Bld: 159 mg/dL — ABNORMAL HIGH (ref 70–99)
Potassium: 4.1 mEq/L (ref 3.7–5.3)
Sodium: 131 mEq/L — ABNORMAL LOW (ref 137–147)
Total Bilirubin: 2.6 mg/dL — ABNORMAL HIGH (ref 0.3–1.2)
Total Protein: 5.5 g/dL — ABNORMAL LOW (ref 6.0–8.3)

## 2013-09-01 LAB — MRSA PCR SCREENING: MRSA BY PCR: NEGATIVE

## 2013-09-01 LAB — TROPONIN I
Troponin I: 0.97 ng/mL (ref <0.30)
Troponin I: 0.58 ng/mL (ref <0.30)
Troponin I: 0.62 ng/mL (ref <0.30)

## 2013-09-01 LAB — MAGNESIUM: Magnesium: 0.8 mg/dL — CL (ref 1.5–2.5)

## 2013-09-01 LAB — FERRITIN: FERRITIN: 4395 ng/mL — AB (ref 10–291)

## 2013-09-01 MED ORDER — AMIODARONE HCL IN DEXTROSE 360-4.14 MG/200ML-% IV SOLN
60.0000 mg/h | INTRAVENOUS | Status: AC
  Administered 2013-09-01: 60 mg/h via INTRAVENOUS
  Filled 2013-09-01: qty 200

## 2013-09-01 MED ORDER — ADULT MULTIVITAMIN W/MINERALS CH
1.0000 | ORAL_TABLET | Freq: Every day | ORAL | Status: DC
  Administered 2013-09-01 – 2013-09-06 (×6): 1 via ORAL
  Filled 2013-09-01 (×8): qty 1

## 2013-09-01 MED ORDER — TBO-FILGRASTIM 300 MCG/0.5ML ~~LOC~~ SOSY
300.0000 ug | PREFILLED_SYRINGE | Freq: Every morning | SUBCUTANEOUS | Status: DC
  Administered 2013-09-02: 300 ug via SUBCUTANEOUS
  Filled 2013-09-01 (×4): qty 0.5

## 2013-09-01 MED ORDER — DRONABINOL 2.5 MG PO CAPS
2.5000 mg | ORAL_CAPSULE | Freq: Two times a day (BID) | ORAL | Status: DC
  Administered 2013-09-02 – 2013-09-07 (×9): 2.5 mg via ORAL
  Filled 2013-09-01 (×10): qty 1

## 2013-09-01 MED ORDER — AMIODARONE HCL IN DEXTROSE 360-4.14 MG/200ML-% IV SOLN
30.0000 mg/h | INTRAVENOUS | Status: DC
  Filled 2013-09-01 (×8): qty 200

## 2013-09-01 MED ORDER — AMIODARONE LOAD VIA INFUSION
150.0000 mg | Freq: Once | INTRAVENOUS | Status: AC
  Administered 2013-09-01: 150 mg via INTRAVENOUS
  Filled 2013-09-01 (×2): qty 83.34

## 2013-09-01 MED ORDER — MAGNESIUM SULFATE 4000MG/100ML IJ SOLN
4.0000 g | Freq: Once | INTRAMUSCULAR | Status: AC
  Administered 2013-09-01: 4 g via INTRAVENOUS
  Filled 2013-09-01: qty 100

## 2013-09-01 MED ORDER — LORAZEPAM 2 MG/ML IJ SOLN
0.5000 mg | Freq: Once | INTRAMUSCULAR | Status: AC
  Administered 2013-09-01: 0.5 mg via INTRAVENOUS
  Filled 2013-09-01: qty 1

## 2013-09-01 NOTE — Progress Notes (Signed)
[place]  Telephone:([phone]) [place] Physician:  Dr. Julien Nordmann Reason for Consult: Lung Cancer  Subjective:  She continues to be weak, her shortness of breath is unchanged. She had  an episode of atrial fibrillation with rapid ventricular rate overnight, requiring admission to the SD U, at which time PCCM was involved to stabilize her symptoms. Of note, on 08/31/2013 the patient has received 2 units of packed RBCs and 2 units of platelets and hemoglobin of 6.1 and platelets of less than 5 with improvement of her counts. It is no acute bleeding issues. Her appetite continues to be poor.  MEDICATIONS: Scheduled Meds: . allopurinol  100 mg Oral BID  . antiseptic oral rinse  15 mL Mouth Rinse q12n4p  . budesonide-formoterol  2 puff Inhalation BID  . fluconazole  200 mg Oral Daily  . lactose free nutrition  237 mL Oral TID BM  . levofloxacin  500 mg Oral Daily  . multivitamin with minerals  1 tablet Oral Daily  . pantoprazole  40 mg Oral BID  . polyethylene glycol  17 g Oral Daily  . potassium chloride  40 mEq Oral Once  . protein supplement  1 scoop Oral TID WC  . sucralfate  1 g Oral TID WC & HS   Continuous Infusions: . 0.9 % NaCl with KCl 40 mEq / L 100 mL/hr at 09/01/13 1036  . amiodarone 30 mg/hr (09/01/13 0745)   PRN Meds:.acetaminophen, alum & mag hydroxide-simeth, levalbuterol, ondansetron (ZOFRAN) IV, ondansetron, prochlorperazine ALLERGIES: No Known Allergies   PHYSICAL EXAMINATION:   Filed Vitals:   09/01/13 0700  BP: 116/68  Pulse: 103  Temp:   Resp: 23   Filed Weights   09/16/2013 1727  Weight: 92 lb 2.4 oz (41.84 kg)    73 year old in no acute distress, frail, ill appearing HEENT: temporal wasting, atraumatic.Sclera anicteric.Oral cavity without thrush or lesions. Neck: supple.No thyromegaly,no cervical or supraclavicular adenopathy  Lungs: basilar crackles present. No wheezing,rhonchi or  rales. Cardiac:regular rate and rhythm,no murmur,rubs or gallops Abdomen: soft nontender,bowel sounds x4. Nohepatosplenomegaly Extremities:no clubbing cyanosis or edema. No petechial rash. Several areas of bruising at the venipuncture sites Neuro:No focal or motor deficits  LABORATORY/RADIOLOGY DATA:   Recent Labs Lab 08/26/13 0907 09/03/2013 1252 08/31/13 0510 09/01/13 0103 09/01/13 0414  WBC < 0.2* 0.2* 0.2* 0.3* 0.4*  HGB 9.6* 7.6* 6.1* 10.1* 9.5*  HCT 27.6* 21.0* 17.5* 28.7* 26.3*  PLT 73* 8* <5* 93* 75*  MCV 82.9 82.7 82.9 84.2 84.0  MCH 28.8 29.9 28.9 29.6 30.4  MCHC 34.8 36.2* 34.9 35.2 36.1*  RDW 17.0* 15.8* 16.3* 15.1 15.3  LYMPHSABS  --   --   --  0.1* 0.2*  MONOABS  --   --   --  0.0* 0.0*  EOSABS  --   --   --  0.0 0.0  BASOSABS  --   --   --  0.0 0.0    CMP    Recent Labs Lab 08/26/13 0907 09/20/2013 1252 08/31/13 0510 09/01/13 0103 09/01/13 0414  NA 134* 130* 129* 131* 132*  K 2.9* 2.3* 4.1 4.1 3.6*  CL  --  90* 98 97 94*  CO2 25 24 18* 15* 18*  GLUCOSE 110 79 76 159* 206*  BUN 20.9 11 8 11 12   CREATININE 0.7 0.56 0.47* 0.54 0.60  CALCIUM 8.9 7.8* 7.1* 7.7* 7.6*  MG  --   --   --  0.8*  --  AST 50* 22  --  40*  --   ALT 45 24  --  27  --   ALKPHOS 568* 348*  --  350*  --   BILITOT 1.09 1.0  --  2.6*  --         Component Value Date/Time   BILITOT 2.6* 09/01/2013 0103   BILITOT 1.09 08/26/2013 0907      Urinalysis    Component Value Date/Time   COLORURINE YELLOW 08/22/2013 1538   APPEARANCEUR CLEAR 09/14/2013 1538   LABSPEC 1.012 09/20/2013 1538   LABSPEC 1.025 08/12/2013 1542   PHURINE 6.0 08/29/2013 1538   GLUCOSEU NEGATIVE 08/26/2013 1538   GLUCOSEU Negative 08/12/2013 [place] 09/03/2013 1538   BILIRUBINUR NEGATIVE 09/05/2013 1538   [place] 08/22/2013 1538   PROTEINUR 30* 08/29/2013 1538   UROBILINOGEN 1.0 09/13/2013 1538   UROBILINOGEN 0.2 08/12/2013 1542   NITRITE NEGATIVE 09/01/2013 1538   LEUKOCYTESUR NEGATIVE 08/29/2013  1538    Drugs of Abuse  No results found for this basename: labopia, cocainscrnur, labbenz, amphetmu, thcu, labbarb     Liver Function Tests:  Recent Labs Lab 08/26/13 0907 08/28/2013 1252 09/01/13 0103  AST 50* 22 40*  ALT 45 24 27  ALKPHOS 568* 348* 350*  BILITOT 1.09 1.0 2.6*  PROT 5.5* 5.5* 5.5*  ALBUMIN 2.2* 2.0* 2.2*    Recent Labs Lab 08/28/2013 1252  LIPASE 22    Cardiac Enzymes:  Recent Labs Lab 09/01/13 0103 09/01/13 0745  TROPONINI 0.97* 0.58*     Radiology Studies:   Chest Port 1 View  09/01/2013   CLINICAL DATA:  New onset atrial fibrillation and crackles.  EXAM: PORTABLE CHEST - 1 VIEW  COMPARISON:  None.  FINDINGS: Normal heart size and pulmonary vascularity. Probable emphysematous changes and fibrosis in the lungs with peribronchial thickening suggesting chronic bronchitis. No focal airspace disease identified. No pneumothorax.  IMPRESSION: Emphysematous changes, chronic bronchitic changes comment fibrosis. No evidence of active infiltration.   Electronically Signed   By: Lucienne Capers M.D.   On: 09/01/2013 01:23       ASSESSMENT AND PLAN:  1. 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 She also received cycle 1 chemotherapy with carboplatinum and etoposide 4/28 to  followed by Neulasta therapy on 5/1. She was due for  Cycle 2 chemo on 5/17.  2.Severe pancytopenia most likely secondary to chemotherapy induced and electrolyte imbalance  Received  2 units of platelet transfusion and 2 units of packed RBC transfusions on 5/10. Posttransfusion CBC shows improvement. Maintain hemoglobin of 8 g/dL and platelets count of more than 20000 (nose bleeds).  Monitor closely Stool occult blood and iron indices pending. Continue neutropenic precautions  Continue empiric antibiotic and antifungal treatment. Electrolyte correction is in progress  3. New onset of A Fib/RVR secondary to metastatic lung disease in the setting of anemia. Appreciate CCM involvement.  4. DNR  Appreciate all the dear provided to this nice patient by the admitting team and the multiple specialties  **Disclaimer: This note was dictated with voice recognition software. Similar sounding words can inadvertently be transcribed and this note may contain transcription errors which may not have been corrected upon publication of note.Rondel Jumbo, PA-C 09/01/2013, 8:17 AM  ADDENDUM:  Hematology/Oncology Attending:  The patient is seen and examined. I agree with the above note. 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 She was started recently on systemic chemotherapy with carboplatin and etoposide status post 1 cycle. Her treatment is complicated with significant pancytopenia as well as lack of appetite and dehydration. The patient received IV fluids several time on outpatient basis but she was getting weaker. She was brought by her daughter to the emergency department and she was found to be febrile to the neck and pancytopenic. She received PRBCs as well as platelet transfusion. She was also started on IV hydration and feeling a little bit better. She continues to have significant neutropenia even with the fact that the patient receive Neulasta injection after her last cycle of the chemotherapy. I have a lengthy discussion with the patient and her daughter who was at the bedside today. I would consider  the patient for a few doses of Neupogen during her admission to improve her neutrophil count faster in the next few days. The patient has oral thrush and she will continue on Diflucan 200 mg by mouth daily. She also complain of poor appetite and I discussed with the patient and her daughter starting her on a small dose of Marinol 2.5 mg by mouth twice a day. Thank you for taking good care of Ms. Mapel, I will continue to follow up the patient with you and assist in her management.

## 2013-09-01 NOTE — Progress Notes (Signed)
[facility] PROGRESS NOTE  Bently Morath YYQ:[id number] DOB: 04-01-1941 DOA: 09/16/2013 PCP: [name], MD  Assessment/Plan: Severe Pancytopenia, Chemotherapy-Induced -5/10: WBC 0.2, Hb 6.1, Plt <5. 5/11: WBC 0.4, Hb 9.5, Plt 75 after transfusion of 2 units of PRBCs and 2 units of pheresed platelets. -Continue neutropenic precautions. -Has been started on levaquin for prophylaxis (thankfully not febrile). -Nosebleed seems to have ceased for now. -Received Neulasta after he last chemo, per heme-onc no need to repeat. -Appreciate onc following for recommendations.  New Onset A Fib with RVR -Required transfer to ICU and amiodarone drip overnight. -Has converted back to NSR ; amio drip has been weaned off. -Likely 2/2 anemia and acute illness. -Will transfer back to the floor today.  Elevated Troponin -Denies CP/SOB. -Likely related to a fib with RVR. -Check 2D ECHO. -If EF ok and no WMA, no further work up; if abnormal will request cards consultation.  [place] Carcinoma on Lung -Continue OP management with Oncology. (Dr. Julien Nordmann).  Hypokalemia -Repleted  Hyponatremia -Continue IVF.  Severe Protein-Caloric Malnutrition -Nutrition consult. -Continue to encourage PO intake.  Code Status: DNR Family Communication: Patient only  Disposition Plan: To be determined   Consultants:  Heme/Onc   Antibiotics:  Levaquin   Subjective: No complaints other than extreme fatigue.  Objective: Filed Vitals:   09/01/13 1004 09/01/13 1100 09/01/13 1200 09/01/13 1500  BP:  114/76 106/72 114/78  Pulse:  103 100 109  Temp:    97.6 F (36.4 C)  TempSrc:    Oral  Resp:  27 20 18   Height:      Weight:      SpO2: 99% 99% 99% 98%    Intake/Output Summary (Last 24 hours) at 09/01/13 1702 Last data filed at 09/01/13 1200  Gross per 24 hour  Intake 1559.83 ml  Output      0 ml  Net 1559.83 ml   Filed Weights   08/26/2013 1727  Weight: 41.8 kg (92 lb 2.4 oz)     Exam:   General:  AA Ox3  Cardiovascular: RRR   Respiratory: CTA B  Abdomen: S/NT/ND/+BS  Extremities: no C/C/E   Neurologic:  Non-focal  Data Reviewed: Basic Metabolic Panel:  Recent Labs Lab 08/26/13 0907 09/05/2013 1252 08/31/13 0510 09/01/13 0103 09/01/13 0414  NA 134* 130* 129* 131* 132*  K 2.9* 2.3* 4.1 4.1 3.6*  CL  --  90* 98 97 94*  CO2 25 24 18* 15* 18*  GLUCOSE 110 79 76 159* 206*  BUN 20.9 11 8 11 12   CREATININE 0.7 0.56 0.47* 0.54 0.60  CALCIUM 8.9 7.8* 7.1* 7.7* 7.6*  MG  --   --   --  0.8*  --    Liver Function Tests:  Recent Labs Lab 08/26/13 0907 09/07/2013 1252 09/01/13 0103  AST 50* 22 40*  ALT 45 24 27  ALKPHOS 568* 348* 350*  BILITOT 1.09 1.0 2.6*  PROT 5.5* 5.5* 5.5*  ALBUMIN 2.2* 2.0* 2.2*    Recent Labs Lab 09/20/2013 1252  LIPASE 22   No results found for this basename: AMMONIA,  in the last 168 hours CBC:  Recent Labs Lab 08/26/13 0907 09/14/2013 1252 08/31/13 0510 09/01/13 0103 09/01/13 0414  WBC < 0.2* 0.2* 0.2* 0.3* 0.4*  NEUTROABS  --   --   --  0.2* 0.2*  HGB 9.6* 7.6* 6.1* 10.1* 9.5*  HCT 27.6* 21.0* 17.5* 28.7* 26.3*  MCV 82.9 82.7 82.9 84.2 84.0  PLT 73* 8* <5* 93* 75*  Cardiac Enzymes:  Recent Labs Lab 09/01/13 0103 09/01/13 0745 09/01/13 1411  TROPONINI 0.97* 0.58* 0.62*   BNP (last 3 results) No results found for this basename: PROBNP,  in the last 8760 hours CBG: No results found for this basename: GLUCAP,  in the last 168 hours  Recent Results (from the past 240 hour(s))  TECHNOLOGIST REVIEW     Status: None   Collection Time    08/26/13  9:07 AM      Result Value Ref Range Status   Technologist Review few lymphs, oc mono   Final  MRSA PCR SCREENING     Status: None   Collection Time    09/01/13  2:09 AM      Result Value Ref Range Status   MRSA by PCR NEGATIVE  NEGATIVE Final   Comment:            The GeneXpert MRSA Assay (FDA     approved for NASAL specimens     only), is one  component of a     comprehensive MRSA colonization     surveillance program. It is not     intended to diagnose MRSA     infection nor to guide or     monitor treatment for     MRSA infections.     Studies: Dg Chest Port 1 View  09/01/2013   CLINICAL DATA:  New onset atrial fibrillation and crackles.  EXAM: PORTABLE CHEST - 1 VIEW  COMPARISON:  None.  FINDINGS: Normal heart size and pulmonary vascularity. Probable emphysematous changes and fibrosis in the lungs with peribronchial thickening suggesting chronic bronchitis. No focal airspace disease identified. No pneumothorax.  IMPRESSION: Emphysematous changes, chronic bronchitic changes comment fibrosis. No evidence of active infiltration.   Electronically Signed   By: Lucienne Capers M.D.   On: 09/01/2013 01:23    Scheduled Meds: . allopurinol  100 mg Oral BID  . antiseptic oral rinse  15 mL Mouth Rinse q12n4p  . budesonide-formoterol  2 puff Inhalation BID  . fluconazole  200 mg Oral Daily  . lactose free nutrition  237 mL Oral TID BM  . levofloxacin  500 mg Oral Daily  . multivitamin with minerals  1 tablet Oral Daily  . pantoprazole  40 mg Oral BID  . polyethylene glycol  17 g Oral Daily  . potassium chloride  40 mEq Oral Once  . protein supplement  1 scoop Oral TID WC  . sucralfate  1 g Oral TID WC & HS   Continuous Infusions: . 0.9 % NaCl with KCl 40 mEq / L 100 mL/hr at 09/01/13 1036  . amiodarone 30 mg/hr (09/01/13 0745)    Principal Problem:   Pancytopenia Active Problems:   Hyponatremia   Protein-calorie malnutrition, severe   Non-small cell cancer of right lung   Hypokalemia    Time spent: 35 minutes. Greater than 50% of this time was spent in direct contact with the patient coordinating care.    [facility] Pager [phone]  If 7PM-7AM, please contact night-coverage at [email or web address], password [facility] 09/01/2013, 5:02 PM  LOS: 2 days

## 2013-09-01 NOTE — Progress Notes (Signed)
Pt rhythm noted to change from ST (110s-120s) to SVT, and then Afib with RVR on the telemetry monitor, HR reaching 215, but maintaining between 150-180s. Pt alert and oriented, with c/o pain in abd, back, and her chest, which she describes as "thumping". VS and EKG obtained. Lamar Blinks, NP notified and [facility] called to pt's bedside to assess. Dr. Posey Pronto also to pt bedside to assess. Pt transferred to stepdown  RM 1231, where she will continue to be monitored, and report was given to Rapid Response RN. Unable to contact daughter, Karle Barr [phone]). Pt informed attempt to contact family was unsuccessful.

## 2013-09-01 NOTE — Progress Notes (Signed)
Event: Notified by RN at 0050 that pt converted from ST to A-Fib w/ RVR w/ current rate in the 150-180's w/ burst to low 200's. Pt c/o sensation that her heart is racing and some increased SOB.  EKG has been obtained but is not crossing over to EPIC. Stat PCXR, CBC, C-Met and Trop ordered. NP to bedside.  Subjective: Pt denies CP but admits to increased SOB and awareness that her heart is racing. Denies knowledge of any previous h/o A-Fib. Objective: Rachael Wall is a 73 y.o. 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 She has had a craniotomy and started chemo last week. She has had a poor appetite and has had to be given IVF multiple times over the past week. She presented to [facility] on 09/18/2013 w/ c/o severe weakness and was found to be hypokalemic and pancytopenic. Today Rachael Wall has received 2 units of PRBC's and 2 units of platelets for Hb of 6.1 and platelets of< 5. At bedside pt noted lying in bed in no acute distress. She is tachypneic (32-36 w/ 02 sats of 100% on 2L [place]). Skin is w/d, color WNL's. BBS w/ faint crackles to LLL and diminished over RLL.  12-Lead EKG reveals A-Fib w/ RVR w/ a rate of 156. SBP > 100, HR-178 and pt is afebrile. PCXR is w/o acute findings. CBC, C-Met, Mag and troponin pending. Assessment/Plan: 1. A-Fib w/ RVR: (New onset) Likely related to pt's underlying lung disease and acute illness. Will transfer to SDU and start Amiodarone given soft BP.  Will cycle Troponin's and follow repeat labs. Discussed pt w/ Dr Posey Pronto who is in agreement w/ current plan and has also seen pt at bedside and reviewed EKG. Will continue to monitor closely in SDU.  Rachael Columbia, NP-C [facility] Pager [phone]

## 2013-09-01 NOTE — Progress Notes (Signed)
Agree with the previous RN's assessment. Will continue to monitor patient. Rachael Wall

## 2013-09-01 NOTE — Progress Notes (Signed)
[facility]  Patient Status: Active (receiving services up to time of hospitalization)  [facility] is providing the following services: RN and PT  If patient discharges after hours, please call [phone].   Kristen Hayworth 09/01/2013, 10:02 AM

## 2013-09-01 NOTE — Progress Notes (Signed)
INITIAL NUTRITION ASSESSMENT  Pt meets criteria for severe MALNUTRITION in the context of chronic illness as evidenced by <75% estimated energy intake in the past month with 14% weight loss in the past 2 months with severe muscle wasting and subcutaneous fat loss in arms and hands.  DOCUMENTATION CODES Per approved criteria  -Severe malnutrition in the context of chronic illness   INTERVENTION: - Continue Boost Plus TID - Recommend MD discuss appetite stimulant in addition to enteral nutrition as pt with severe malnutrition with stage IV poorly differentiated high-grade large cell neuroendocrine carcinoma - palliative consult recommended - Multivitamin 1 tablet PO daily  - Will continue to monitor   NUTRITION DIAGNOSIS: Inadequate oral intake related to poor appetite as evidenced by pt report.   Goal: Pt to consume >90% of meals/supplements  Monitor:  Weights, labs, intake  Reason for Assessment: Malnutrition screening tool, consult for assessment   73 y.o. female  Admitting Dx: Pancytopenia  ASSESSMENT: Pt with non-small cell lung cancer with metastasis to the brain and throughout the lungs. She has had a craniotomy and started chemo last week. She has had a poor appetite and has had to be given IVF multiple times over the past week. She comes in now for severe weakness and is found to be hypokalemic and pancytopenic. She will be admitted for K replacement, blood transfusion and IVF.   Pt has been seen by [facility] RD. Pt not detailed in answers - reports poor appetite for a long time, not eating food at home, only drinking 1-2 Boost/day. Denied any problems chewing or swallowing. Had episode of vomiting on Friday but denied any further vomiting since then. Denies any nausea today. Pt with severe cachexia in arms and hands, with moderate fat loss in temporal/orbital region. Pt has lost 15 pounds unintentionally in the past 2 months. RN reports the only thing pt has  consumed today has been at small amount of oatmeal.    Potassium low, getting oral and IV replacement Alk phos, AST, and total bilirubin elevated     Height: Ht Readings from Last 1 Encounters:  08/29/2013 $RemoveB'4\' 11"'hbAGeuNt$  (1.499 m)    Weight: Wt Readings from Last 1 Encounters:  09/13/2013 92 lb 2.4 oz (41.8 kg)    Ideal Body Weight: 98 lbs   % Ideal Body Weight: 94%  Wt Readings from Last 10 Encounters:  08/29/2013 92 lb 2.4 oz (41.8 kg)  08/26/13 86 lb 3.2 oz (39.1 kg)  08/12/13 89 lb 8 oz (40.597 kg)  08/06/13 90 lb 12.8 oz (41.187 kg)  08/04/13 87 lb 14.4 oz (39.871 kg)  07/28/13 91 lb 14.4 oz (41.686 kg)  07/23/13 92 lb 3.2 oz (41.822 kg)  07/18/13 96 lb 6.4 oz (43.727 kg)  07/09/13 107 lb 9.4 oz (48.8 kg)  07/09/13 107 lb 9.4 oz (48.8 kg)    Usual Body Weight: 107 lbs   % Usual Body Weight: 86%  BMI:  Body mass index is 18.6 kg/(m^2).  Estimated Nutritional Needs: Kcal: 1300-1500 Protein: 65-80g Fluid: 1.3-1.5L/day  Skin: +1 RLE, LLE edema  Diet Order: General  EDUCATION NEEDS: -No education needs identified at this time   Intake/Output Summary (Last 24 hours) at 09/01/13 0946 Last data filed at 09/01/13 0800  Gross per 24 hour  Intake 2184.83 ml  Output      0 ml  Net 2184.83 ml    Last BM: 5/10  Labs:   Recent Labs Lab 08/31/13 0510 09/01/13 0103 09/01/13 0414  NA 129* 131* 132*  K 4.1 4.1 3.6*  CL 98 97 94*  CO2 18* 15* 18*  BUN _0 CREATININE 0.47* 0.54 0.60  CALCIUM 7.1* 7.7* 7.6*  MG  --  0.8*  --   GLUCOSE 76 159* 206*    CBG (last 3)  No results found for this basename: GLUCAP,  in the last 72 hours  Scheduled Meds: . allopurinol  100 mg Oral BID  . antiseptic oral rinse  15 mL Mouth Rinse q12n4p  . budesonide-formoterol  2 puff Inhalation BID  . feeding supplement (ENSURE COMPLETE)  237 mL Oral BID BM  . fluconazole  200 mg Oral Daily  . lactose free nutrition  237 mL Oral TID BM  . levofloxacin  500 mg Oral Daily  .  pantoprazole  40 mg Oral BID  . polyethylene glycol  17 g Oral Daily  . potassium chloride  40 mEq Oral Once  . protein supplement  1 scoop Oral TID WC  . sucralfate  1 g Oral TID WC & HS    Continuous Infusions: . 0.9 % NaCl with KCl 40 mEq / L 100 mL/hr at 08/31/13 1539  . amiodarone 30 mg/hr (09/01/13 0745)    Past Medical History  Diagnosis Date  . Diverticulitis   . 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 Suboccipital craniectomy cervical laminectomy N/A 07/07/2013    Procedure: Left suboccipital craniectomy for resection of tumor ;  Surgeon: Ophelia Charter, MD;  Location: [place] [facility];  Service: Neurosurgery;  Laterality: N/A;    Mikey College MS, [place], [place] Pager [phone] After Hours Pager

## 2013-09-01 NOTE — Progress Notes (Signed)
Pt noted to have fine crackles in bilateral lower lobes, primarily in the LLL, approximately two hrs after receiving 20mg  IV lasix. Pt having elevated RR (20-30s), c/o SOB, and O2 sat WNL on 2L via nasal cannula. Notified K. Schorr, NP, who ordered an additional 20 mg lasix. After IV lasix received an improvement in lung sounds were noted. Pt incontinent, but pt noted to void multiple times after IV lasix received. Will continue to monitor.

## 2013-09-02 ENCOUNTER — Other Ambulatory Visit: Payer: Medicare HMO

## 2013-09-02 DIAGNOSIS — I5021 Acute systolic (congestive) heart failure: Secondary | ICD-10-CM | POA: Diagnosis present

## 2013-09-02 DIAGNOSIS — I498 Other specified cardiac arrhythmias: Secondary | ICD-10-CM

## 2013-09-02 DIAGNOSIS — I480 Paroxysmal atrial fibrillation: Secondary | ICD-10-CM | POA: Diagnosis not present

## 2013-09-02 DIAGNOSIS — I509 Heart failure, unspecified: Secondary | ICD-10-CM

## 2013-09-02 DIAGNOSIS — I4891 Unspecified atrial fibrillation: Secondary | ICD-10-CM

## 2013-09-02 DIAGNOSIS — I369 Nonrheumatic tricuspid valve disorder, unspecified: Secondary | ICD-10-CM

## 2013-09-02 LAB — CBC
HEMATOCRIT: 30.5 % — AB (ref 36.0–46.0)
HEMOGLOBIN: 10.8 g/dL — AB (ref 12.0–15.0)
MCH: 30.1 pg (ref 26.0–34.0)
MCHC: 35.4 g/dL (ref 30.0–36.0)
MCV: 85 fL (ref 78.0–100.0)
Platelets: 75 10*3/uL — ABNORMAL LOW (ref 150–400)
RBC: 3.59 MIL/uL — ABNORMAL LOW (ref 3.87–5.11)
RDW: 15.9 % — ABNORMAL HIGH (ref 11.5–15.5)
WBC: 1.1 10*3/uL — CL (ref 4.0–10.5)

## 2013-09-02 LAB — DIFFERENTIAL
Basophils Absolute: 0 10*3/uL (ref 0.0–0.1)
Basophils Relative: 1 % (ref 0–1)
EOS PCT: 0 % (ref 0–5)
Eosinophils Absolute: 0 10*3/uL (ref 0.0–0.7)
Lymphocytes Relative: 25 % (ref 12–46)
Lymphs Abs: 0.3 10*3/uL — ABNORMAL LOW (ref 0.7–4.0)
MONOS PCT: 18 % — AB (ref 3–12)
Monocytes Absolute: 0.2 10*3/uL (ref 0.1–1.0)
NEUTROS PCT: 56 % (ref 43–77)
Neutro Abs: 0.6 10*3/uL — ABNORMAL LOW (ref 1.7–7.7)

## 2013-09-02 MED ORDER — LORAZEPAM 0.5 MG PO TABS: 0.5000 mg | ORAL_TABLET | Freq: Two times a day (BID) | ORAL | Status: DC | PRN

## 2013-09-02 MED ORDER — AMIODARONE HCL 200 MG PO TABS
400.0000 mg | ORAL_TABLET | Freq: Every day | ORAL | Status: DC
  Administered 2013-09-03 – 2013-09-09 (×7): 400 mg via ORAL
  Filled 2013-09-02 (×10): qty 2

## 2013-09-02 MED ORDER — FUROSEMIDE 10 MG/ML IJ SOLN
40.0000 mg | Freq: Every day | INTRAMUSCULAR | Status: DC
  Administered 2013-09-02 – 2013-09-06 (×5): 40 mg via INTRAVENOUS
  Filled 2013-09-02 (×5): qty 4

## 2013-09-02 MED ORDER — CARVEDILOL 3.125 MG PO TABS
3.1250 mg | ORAL_TABLET | Freq: Two times a day (BID) | ORAL | Status: DC
  Administered 2013-09-02 – 2013-09-09 (×13): 3.125 mg via ORAL
  Filled 2013-09-02 (×22): qty 1

## 2013-09-02 MED ORDER — OXYCODONE-ACETAMINOPHEN 5-325 MG PO TABS
2.0000 | ORAL_TABLET | Freq: Once | ORAL | Status: AC
  Administered 2013-09-02: 2 via ORAL
  Filled 2013-09-02: qty 2

## 2013-09-02 MED ORDER — LORAZEPAM 2 MG/ML IJ SOLN
0.5000 mg | Freq: Once | INTRAMUSCULAR | Status: AC
  Administered 2013-09-02: 0.5 mg via INTRAVENOUS
  Filled 2013-09-02: qty 1

## 2013-09-02 NOTE — Progress Notes (Signed)
[facility] PROGRESS NOTE  Rachael Wall WRU:[id number] DOB: 03-May-1940 DOA: 09/15/2013 PCP: [name], MD  Assessment/Plan: Severe Pancytopenia, Chemotherapy-Induced -5/10: WBC 0.2, Hb 6.1, Plt <5. -5/11: WBC 0.4, Hb 9.5, Plt 75 after transfusion of 2 units of PRBCs and 2 units of pheresed platelets. -5/12: WBC 1.1, Hb 10.8, plt 75. -Continue neutropenic precautions. -Has been started on levaquin for prophylaxis (thankfully not febrile). -Nosebleed seems to have ceased for now. -Receiving G-CSF.  New Onset A Fib with RVR -Required transfer to ICU and amiodarone drip overnight on 5/10. -Has converted back to NSR ; amio drip has been weaned off. -Likely 2/2 anemia and acute illness. -Avoid anticoagualtion with current counts.  Elevated Troponin -Denies CP/SOB. -ECHO with EF 20-25%. NEW. -Repeat EKG today. -Will request cards evaluation. -Likely no cath given ongoing issues with lung cancer.  [place] Carcinoma on Lung -Continue OP management with Oncology. (Dr. Julien Nordmann).  Hypokalemia -Repleted  Hyponatremia -Continue IVF.  Severe Protein-Caloric Malnutrition -Nutrition consult. -Continue to encourage PO intake.  Code Status: DNR Family Communication: Patient only  Disposition Plan: To be determined   Consultants:  Heme/Onc   Antibiotics:  Levaquin   Subjective: No complaints other than extreme fatigue.  Objective: Filed Vitals:   09/01/13 2114 09/02/13 0443 09/02/13 1022 09/02/13 1400  BP: 114/76 123/59  107/69  Pulse: 112 130  111  Temp: 98.4 F (36.9 C) 98 F (36.7 C)  97.9 F (36.6 C)  TempSrc: Axillary Axillary  Oral  Resp: 24 36  28  Height:      Weight:      SpO2: 100% 99% 97% 97%    Intake/Output Summary (Last 24 hours) at 09/02/13 1458 Last data filed at 09/02/13 9147  Gross per 24 hour  Intake   1865 ml  Output      0 ml  Net   1865 ml   Filed Weights   08/24/2013 1727  Weight: 41.8 kg (92 lb 2.4 oz)     Exam:   General:  AA Ox3  Cardiovascular: RRR   Respiratory: CTA B  Abdomen: S/NT/ND/+BS  Extremities: no C/C/E   Neurologic:  Non-focal  Data Reviewed: Basic Metabolic Panel:  Recent Labs Lab 09/08/2013 1252 08/31/13 0510 09/01/13 0103 09/01/13 0414  NA 130* 129* 131* 132*  K 2.3* 4.1 4.1 3.6*  CL 90* 98 97 94*  CO2 24 18* 15* 18*  GLUCOSE 79 76 159* 206*  BUN 11 8 11 12   CREATININE 0.56 0.47* 0.54 0.60  CALCIUM 7.8* 7.1* 7.7* 7.6*  MG  --   --  0.8*  --    Liver Function Tests:  Recent Labs Lab 09/08/2013 1252 09/01/13 0103  AST 22 40*  ALT 24 27  ALKPHOS 348* 350*  BILITOT 1.0 2.6*  PROT 5.5* 5.5*  ALBUMIN 2.0* 2.2*    Recent Labs Lab 09/11/2013 1252  LIPASE 22   No results found for this basename: AMMONIA,  in the last 168 hours CBC:  Recent Labs Lab 09/14/2013 1252 08/31/13 0510 09/01/13 0103 09/01/13 0414 09/02/13 0424  WBC 0.2* 0.2* 0.3* 0.4* 1.1*  NEUTROABS  --   --  0.2* 0.2* 0.6*  HGB 7.6* 6.1* 10.1* 9.5* 10.8*  HCT 21.0* 17.5* 28.7* 26.3* 30.5*  MCV 82.7 82.9 84.2 84.0 85.0  PLT 8* <5* 93* 75* 75*   Cardiac Enzymes:  Recent Labs Lab 09/01/13 0103 09/01/13 0745 09/01/13 1411  TROPONINI 0.97* 0.58* 0.62*   BNP (last 3 results) No results found for  this basename: PROBNP,  in the last 8760 hours CBG: No results found for this basename: GLUCAP,  in the last 168 hours  Recent Results (from the past 240 hour(s))  TECHNOLOGIST REVIEW     Status: None   Collection Time    08/26/13  9:07 AM      Result Value Ref Range Status   Technologist Review few lymphs, oc mono   Final  MRSA PCR SCREENING     Status: None   Collection Time    09/01/13  2:09 AM      Result Value Ref Range Status   MRSA by PCR NEGATIVE  NEGATIVE Final   Comment:            The GeneXpert MRSA Assay (FDA     approved for NASAL specimens     only), is one component of a     comprehensive MRSA colonization     surveillance program. It is not      intended to diagnose MRSA     infection nor to guide or     monitor treatment for     MRSA infections.     Studies: Dg Chest Port 1 View  09/01/2013   CLINICAL DATA:  New onset atrial fibrillation and crackles.  EXAM: PORTABLE CHEST - 1 VIEW  COMPARISON:  None.  FINDINGS: Normal heart size and pulmonary vascularity. Probable emphysematous changes and fibrosis in the lungs with peribronchial thickening suggesting chronic bronchitis. No focal airspace disease identified. No pneumothorax.  IMPRESSION: Emphysematous changes, chronic bronchitic changes comment fibrosis. No evidence of active infiltration.   Electronically Signed   By: Lucienne Capers M.D.   On: 09/01/2013 01:23    Scheduled Meds: . allopurinol  100 mg Oral BID  . antiseptic oral rinse  15 mL Mouth Rinse q12n4p  . budesonide-formoterol  2 puff Inhalation BID  . dronabinol  2.5 mg Oral BID AC  . fluconazole  200 mg Oral Daily  . lactose free nutrition  237 mL Oral TID BM  . levofloxacin  500 mg Oral Daily  . multivitamin with minerals  1 tablet Oral Daily  . pantoprazole  40 mg Oral BID  . polyethylene glycol  17 g Oral Daily  . potassium chloride  40 mEq Oral Once  . protein supplement  1 scoop Oral TID WC  . sucralfate  1 g Oral TID WC & HS  . Tbo-filgastrim (GRANIX) SQ  300 mcg Subcutaneous q morning - 10a   Continuous Infusions: . 0.9 % NaCl with KCl 40 mEq / L 100 mL/hr at 09/02/13 0639  . amiodarone 30 mg/hr (09/01/13 0745)    Principal Problem:   Pancytopenia Active Problems:   Hyponatremia   Protein-calorie malnutrition, severe   Non-small cell cancer of right lung   Hypokalemia    Time spent: 35 minutes. Greater than 50% of this time was spent in direct contact with the patient coordinating care.    [facility] Pager [phone]  If 7PM-7AM, please contact night-coverage at [email or web address], password [facility] 09/02/2013, 2:58 PM  LOS: 3 days

## 2013-09-02 NOTE — Progress Notes (Signed)
Echocardiogram 2D Echocardiogram has been performed.  Ines Bloomer 09/02/2013, 12:38 PM

## 2013-09-02 NOTE — Progress Notes (Signed)
Pt restless throughout the night. HR 120- 140 at times. Pt up twice to [facility],  DOE noted. NP notified and  Ativan .5 mg IV given X2 at different intervals. Complained of ABD pain, NP notified and Percocet given for pain. Pt settled down and slept intermittently with nursing staff checking frequently. Will cont to monitor and pass on to day RN. SRP, RN

## 2013-09-02 NOTE — Consult Note (Signed)
CONSULTATION NOTE  Reason for Consult: New cardiomyopathy, A-fib with RVR  Requesting Physician: Dr. Jerilee Hoh  Cardiologist: None (New)  HPI: This is a 73 y.o. female with a past medical history significant for very little - does not appear that she has seen many doctors in the past. She had been treated for diverticulitis at some point. She has a longstanding smoking history and presented with shortness of breath in 06/2013, weight loss, poor appetite. Diagnosed with metastatic lung CA at the time - extending to the brain. She had brain surgery for removal of a left cerebellar metastasis in March.  She is currently on systemic chemotherapy with carboplatin and etoposide as well as palliative radiotherapy. She was admitted again on 5/9 with weakness and FTT - she is markedly pancytopenic. While hospitalized, she developed a-fib with RVR and was given amiodarone - she converted spontaneously back to sinus tachycardia. She then underwent echocardiography which demonstrated an EF of 20-25% with global hypokinesis. No pericardial effusion, but left pleural effusion.  Cardiology is asked to consult and provide management recommendations.  PMHx:  Past Medical History  Diagnosis Date  . Diverticulitis   . 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 Suboccipital craniectomy cervical laminectomy N/A 07/07/2013    Procedure: Left suboccipital craniectomy for resection of tumor ;  Surgeon: Ophelia Charter, MD;  Location: [facility];  Service: Neurosurgery;  Laterality: N/A;    FAMHx: No significant history of CAD. Brother who died of lung CA.  SOCHx:  reports that she quit smoking about 6 months ago. She does not have any smokeless tobacco history on file. She reports that she does not drink alcohol or use illicit drugs.  ALLERGIES: No Known Allergies  ROS: Review of systems not obtained due to patient  factors. (somnolent, non-conversive during exam)  HOME MEDICATIONS: Prescriptions prior to admission  Medication Sig Dispense Refill  . acetaminophen (TYLENOL) 500 MG tablet Take 500 mg by mouth every 6 (six) hours as needed for mild pain.      Marland Kitchen allopurinol (ZYLOPRIM) 100 MG tablet Take 1 tablet (100 mg total) by mouth 2 (two) times daily.  60 tablet  1  . budesonide-formoterol (SYMBICORT) 160-4.5 MCG/ACT inhaler Inhale 2 puffs into the lungs 2 (two) times daily.       Marland Kitchen dexamethasone (DECADRON) 4 MG tablet Take 4 mg by mouth daily.       . feeding supplement, ENSURE COMPLETE, (ENSURE COMPLETE) LIQD Take 237 mLs by mouth 2 (two) times daily between meals.  10 Bottle  0  . fluconazole (DIFLUCAN) 200 MG tablet Take 1 tablet (200 mg total) by mouth daily.  10 tablet  0  . furosemide (LASIX) 20 MG tablet Take 1 tablet (20 mg total) by mouth daily. Every other day, if needed for ankle swelling.  20 tablet  2  . KLOR-CON M20 20 MEQ tablet Take 40 mEq by mouth daily.       Marland Kitchen lactose free nutrition (BOOST PLUS) LIQD Take 237 mLs by mouth 3 (three) times daily between meals.  10 Can  0  . levofloxacin (LEVAQUIN) 500 MG tablet Take 1 tablet (500 mg total) by mouth daily.  7 tablet  0  . loratadine (CLARITIN) 10 MG tablet Take 10 mg by mouth daily. For 5 days during chemo      . ondansetron (ZOFRAN) 4 MG tablet Take 4 mg by  mouth 2 (two) times daily.      Marland Kitchen oxyCODONE-acetaminophen (PERCOCET/ROXICET) 5-325 MG per tablet Take 1-2 tablets by mouth every 6 (six) hours as needed for severe pain.  120 tablet  0  . pantoprazole (PROTONIX) 40 MG tablet Take 40 mg by mouth 2 (two) times daily.      . pegfilgrastim (NEULASTA) 6 MG/0.6ML injection Inject 6 mg into the skin once.      Marland Kitchen PRESCRIPTION MEDICATION etoposide (VEPESID) 100 mg in sodium chloride 0.9 % 250 mL chemo infusion 80 mg/m2  1.3 m2 (Treatment Plan Actual)  Once 08/21/2013      . PRESCRIPTION MEDICATION CARBOplatin (PARAPLATIN) 280 mg in sodium  chloride 0.9 % 100 mL chemo infusion 280 mg  Once 08/19/2013      . prochlorperazine (COMPAZINE) 10 MG tablet Take 1 tablet (10 mg total) by mouth every 6 (six) hours as needed for nausea or vomiting.  60 tablet  0  . protein supplement (RESOURCE BENEPROTEIN) POWD Take 6 g by mouth 3 (three) times daily with meals.  10 Can  0  . sucralfate (CARAFATE) 1 G tablet Take 1 tablet (1 g total) by mouth 4 (four) times daily -  with meals and at bedtime. 5 minutes before meal for esophagitis  90 tablet  3  . Wound Cleansers (RADIAPLEX EX) Apply 1 application topically 2 (two) times daily as needed (For back and chest).         HOSPITAL MEDICATIONS: Scheduled: . allopurinol  100 mg Oral BID  . antiseptic oral rinse  15 mL Mouth Rinse q12n4p  . budesonide-formoterol  2 puff Inhalation BID  . dronabinol  2.5 mg Oral BID AC  . fluconazole  200 mg Oral Daily  . lactose free nutrition  237 mL Oral TID BM  . levofloxacin  500 mg Oral Daily  . multivitamin with minerals  1 tablet Oral Daily  . pantoprazole  40 mg Oral BID  . polyethylene glycol  17 g Oral Daily  . potassium chloride  40 mEq Oral Once  . protein supplement  1 scoop Oral TID WC  . sucralfate  1 g Oral TID WC & HS  . Tbo-filgastrim (GRANIX) SQ  300 mcg Subcutaneous q morning - 10a    VITALS: Blood pressure 107/69, pulse 111, temperature 97.9 F (36.6 C), temperature source Oral, resp. rate 28, height _0  (1.499 m), weight 92 lb 2.4 oz (41.8 kg), SpO2 97.00%.  PHYSICAL EXAM: General appearance: alert, no distress and somnolent, falls asleep during the exam Neck: JVD - 5 cm above sternal notch and no carotid bruit Lungs: diminished breath sounds LLL Heart: regular rate and rhythm and tacycardic, no murmurs Abdomen: soft, non-tender; bowel sounds normal; no masses,  no organomegaly Extremities: extremities normal, atraumatic, no cyanosis or edema Pulses: 1+ pulses Skin: ecchymosis and discoloration over the chest Neurologic: Mental  status: Somnolent, opens eyes to commands, non-verbal Psych: Cannot assess  LABS: Results for orders placed during the hospital encounter of 08/28/2013 (from the past 48 hour(s))  CBC WITH DIFFERENTIAL     Status: Abnormal   Collection Time    09/01/13  1:03 AM      Result Value Ref Range   WBC 0.3 (*) 4.0 - 10.5 K/uL   Comment: CRITICAL VALUE NOTED.  VALUE IS CONSISTENT WITH PREVIOUSLY REPORTED AND CALLED VALUE.   RBC 3.41 (*) 3.87 - 5.11 MIL/uL   Hemoglobin 10.1 (*) 12.0 - 15.0 g/dL   Comment: DELTA CHECK NOTED  REPEATED TO VERIFY   HCT 28.7 (*) 36.0 - 46.0 %   MCV 84.2  78.0 - 100.0 fL   MCH 29.6  26.0 - 34.0 pg   MCHC 35.2  30.0 - 36.0 g/dL   RDW 15.1  11.5 - 15.5 %   Platelets 93 (*) 150 - 400 K/uL   Comment: PLATELET COUNT CONFIRMED BY SMEAR     REPEATED TO VERIFY     SPECIMEN CHECKED FOR CLOTS     DELTA CHECK NOTED   Neutrophils Relative % 52  43 - 77 %   Lymphocytes Relative 35  12 - 46 %   Monocytes Relative 13 (*) 3 - 12 %   Eosinophils Relative 0  0 - 5 %   Basophils Relative 0  0 - 1 %   Neutro Abs 0.2 (*) 1.7 - 7.7 K/uL   Lymphs Abs 0.1 (*) 0.7 - 4.0 K/uL   Monocytes Absolute 0.0 (*) 0.1 - 1.0 K/uL   Eosinophils Absolute 0.0  0.0 - 0.7 K/uL   Basophils Absolute 0.0  0.0 - 0.1 K/uL   WBC Morphology WHITE COUNT CONFIRMED ON SMEAR    COMPREHENSIVE METABOLIC PANEL     Status: Abnormal   Collection Time    09/01/13  1:03 AM      Result Value Ref Range   Sodium 131 (*) 137 - 147 mEq/L   Potassium 4.1  3.7 - 5.3 mEq/L   Chloride 97  96 - 112 mEq/L   CO2 15 (*) 19 - 32 mEq/L   Glucose, Bld 159 (*) 70 - 99 mg/dL   BUN 11  6 - 23 mg/dL   Creatinine, Ser 0.54  0.50 - 1.10 mg/dL   Calcium 7.7 (*) 8.4 - 10.5 mg/dL   Total Protein 5.5 (*) 6.0 - 8.3 g/dL   Albumin 2.2 (*) 3.5 - 5.2 g/dL   AST 40 (*) 0 - 37 U/L   ALT 27  0 - 35 U/L   Alkaline Phosphatase 350 (*) 39 - 117 U/L   Total Bilirubin 2.6 (*) 0.3 - 1.2 mg/dL   GFR calc non Af Amer >90  >90 mL/min   GFR  calc Af Amer >90  >90 mL/min   Comment: (NOTE)     The eGFR has been calculated using the CKD EPI equation.     This calculation has not been validated in all clinical situations.     eGFR's persistently <90 mL/min signify possible Chronic Kidney     Disease.  TROPONIN I     Status: Abnormal   Collection Time    09/01/13  1:03 AM      Result Value Ref Range   Troponin I 0.97 (*) <0.30 ng/mL   Comment:            Due to the release kinetics of cTnI,     a negative result within the first hours     of the onset of symptoms does not rule out     myocardial infarction with certainty.     If myocardial infarction is still suspected,     repeat the test at appropriate intervals.     CRITICAL RESULT CALLED TO, READ BACK BY AND VERIFIED WITH:     MREEEVES RN AT 0215 ON [id number] BY DLONG  MAGNESIUM     Status: Abnormal   Collection Time    09/01/13  1:03 AM      Result Value Ref Range   Magnesium 0.8 (*)  1.5 - 2.5 mg/dL   Comment: CRITICAL RESULT CALLED TO, READ BACK BY AND VERIFIED WITH:     MREEVES RN AT 0215 ON [id number] BY DLONG  IRON AND TIBC     Status: Abnormal   Collection Time    09/01/13  1:30 AM      Result Value Ref Range   Iron 137 (*) 42 - 135 ug/dL   TIBC 165 (*) 250 - 470 ug/dL   Saturation Ratios 83 (*) 20 - 55 %   UIBC 28 (*) 125 - 400 ug/dL   Comment: Performed at [facility]     Status: Abnormal   Collection Time    09/01/13  1:30 AM      Result Value Ref Range   Ferritin 4395 (*) 10 - 291 ng/mL   Comment: Result confirmed by automatic dilution.     Performed at [facility] PCR SCREENING     Status: None   Collection Time    09/01/13  2:09 AM      Result Value Ref Range   MRSA by PCR NEGATIVE  NEGATIVE   Comment:            The GeneXpert MRSA Assay (FDA     approved for NASAL specimens     only), is one component of a     comprehensive MRSA colonization     surveillance program. It is not     intended to diagnose MRSA       infection nor to guide or     monitor treatment for     MRSA infections.  CBC     Status: Abnormal   Collection Time    09/01/13  4:14 AM      Result Value Ref Range   WBC 0.4 (*) 4.0 - 10.5 K/uL   Comment: CRITICAL VALUE NOTED.  VALUE IS CONSISTENT WITH PREVIOUSLY REPORTED AND CALLED VALUE.   RBC 3.13 (*) 3.87 - 5.11 MIL/uL   Hemoglobin 9.5 (*) 12.0 - 15.0 g/dL   HCT 26.3 (*) 36.0 - 46.0 %   MCV 84.0  78.0 - 100.0 fL   MCH 30.4  26.0 - 34.0 pg   MCHC 36.1 (*) 30.0 - 36.0 g/dL   RDW 15.3  11.5 - 15.5 %   Platelets 75 (*) 150 - 400 K/uL   Comment: PLATELET COUNT CONFIRMED BY SMEAR  DIFFERENTIAL     Status: Abnormal   Collection Time    09/01/13  4:14 AM      Result Value Ref Range   Neutrophils Relative % 46  43 - 77 %   Lymphocytes Relative 46  12 - 46 %   Monocytes Relative 8  3 - 12 %   Eosinophils Relative 0  0 - 5 %   Basophils Relative 0  0 - 1 %   Neutro Abs 0.2 (*) 1.7 - 7.7 K/uL   Lymphs Abs 0.2 (*) 0.7 - 4.0 K/uL   Monocytes Absolute 0.0 (*) 0.1 - 1.0 K/uL   Eosinophils Absolute 0.0  0.0 - 0.7 K/uL   Basophils Absolute 0.0  0.0 - 0.1 K/uL   WBC Morphology DOHLE BODIES     Comment: TOXIC GRANULATION  BASIC METABOLIC PANEL     Status: Abnormal   Collection Time    09/01/13  4:14 AM      Result Value Ref Range   Sodium 132 (*) 137 - 147 mEq/L   Potassium 3.6 (*)  3.7 - 5.3 mEq/L   Chloride 94 (*) 96 - 112 mEq/L   CO2 18 (*) 19 - 32 mEq/L   Glucose, Bld 206 (*) 70 - 99 mg/dL   BUN 12  6 - 23 mg/dL   Creatinine, Ser 0.60  0.50 - 1.10 mg/dL   Calcium 7.6 (*) 8.4 - 10.5 mg/dL   GFR calc non Af Amer 88 (*) >90 mL/min   GFR calc Af Amer >90  >90 mL/min   Comment: (NOTE)     The eGFR has been calculated using the CKD EPI equation.     This calculation has not been validated in all clinical situations.     eGFR's persistently <90 mL/min signify possible Chronic Kidney     Disease.  TROPONIN I     Status: Abnormal   Collection Time    09/01/13  7:45 AM       Result Value Ref Range   Troponin I 0.58 (*) <0.30 ng/mL   Comment:            Due to the release kinetics of cTnI,     a negative result within the first hours     of the onset of symptoms does not rule out     myocardial infarction with certainty.     If myocardial infarction is still suspected,     repeat the test at appropriate intervals.     CRITICAL VALUE NOTED.  VALUE IS CONSISTENT WITH PREVIOUSLY REPORTED AND CALLED VALUE.  TROPONIN I     Status: Abnormal   Collection Time    09/01/13  2:11 PM      Result Value Ref Range   Troponin I 0.62 (*) <0.30 ng/mL   Comment:            Due to the release kinetics of cTnI,     a negative result within the first hours     of the onset of symptoms does not rule out     myocardial infarction with certainty.     If myocardial infarction is still suspected,     repeat the test at appropriate intervals.     REPEATED TO VERIFY     SETZER A RN [place] 9201 09/01/13 [place],V     CRITICAL RESULT CALLED TO, READ BACK BY AND VERIFIED WITH:  DIFFERENTIAL     Status: Abnormal   Collection Time    09/02/13  4:24 AM      Result Value Ref Range   Neutrophils Relative % 56  43 - 77 %   Lymphocytes Relative 25  12 - 46 %   Monocytes Relative 18 (*) 3 - 12 %   Eosinophils Relative 0  0 - 5 %   Basophils Relative 1  0 - 1 %   Neutro Abs 0.6 (*) 1.7 - 7.7 K/uL   Lymphs Abs 0.3 (*) 0.7 - 4.0 K/uL   Monocytes Absolute 0.2  0.1 - 1.0 K/uL   Eosinophils Absolute 0.0  0.0 - 0.7 K/uL   Basophils Absolute 0.0  0.0 - 0.1 K/uL   WBC Morphology MILD LEFT SHIFT (1-5% METAS, OCC MYELO, OCC BANDS)     Comment: DOHLE BODIES  CBC     Status: Abnormal   Collection Time    09/02/13  4:24 AM      Result Value Ref Range   WBC 1.1 (*) 4.0 - 10.5 K/uL   Comment: REPEATED TO VERIFY  CRITICAL VALUE NOTED.  VALUE IS CONSISTENT WITH PREVIOUSLY REPORTED AND CALLED VALUE.   RBC 3.59 (*) 3.87 - 5.11 MIL/uL   Hemoglobin 10.8 (*) 12.0 -  15.0 g/dL   HCT 30.5 (*) 36.0 - 46.0 %   MCV 85.0  78.0 - 100.0 fL   MCH 30.1  26.0 - 34.0 pg   MCHC 35.4  30.0 - 36.0 g/dL   RDW 15.9 (*) 11.5 - 15.5 %   Platelets 75 (*) 150 - 400 K/uL   Comment: REPEATED TO VERIFY     CONSISTENT WITH PREVIOUS RESULT    IMAGING: Dg Chest Port 1 View  09/01/2013   CLINICAL DATA:  New onset atrial fibrillation and crackles.  EXAM: PORTABLE CHEST - 1 VIEW  COMPARISON:  None.  FINDINGS: Normal heart size and pulmonary vascularity. Probable emphysematous changes and fibrosis in the lungs with peribronchial thickening suggesting chronic bronchitis. No focal airspace disease identified. No pneumothorax.  IMPRESSION: Emphysematous changes, chronic bronchitic changes comment fibrosis. No evidence of active infiltration.   Electronically Signed   By: Lucienne Capers M.D.   On: 09/01/2013 01:23    HOSPITAL DIAGNOSES: Principal Problem:   Pancytopenia Active Problems:   Hyponatremia   Metastatic lung carcinoma   Emphysema lung   Protein-calorie malnutrition, severe   Non-small cell cancer of right lung   Hypokalemia   Acute systolic CHF (congestive heart failure)   PAF (paroxysmal atrial fibrillation)   IMPRESSION: 1. Significant new cardiomyopathy - there is multivessel coronary calcium and aortic atherosclerosis seen on her CT angiogram from 06/2013.  Suspicious for ischemic or mixed ischemic/non-ischemic cardiomyopathy. Suspect the a-fib is a reflection of her metastatic CA and cardiomyopathy, not the cause of it (due to the short duration).  I doubt that 1 round of chemotherapy is responsible for this as well, although, this may limit her chemotherapy options in the future. Unfortunately, due to her significant pancytopenia, there are very limited options from an interventional perspective. She is not a cardiac catheterization candidate at this time - would therefore negate a role for stress testing given her troponin elevation. The troponin elevation is  likely due to demand ischemia, in the setting of significant cardiomyopathy and possible multivessel CAD.  RECOMMENDATION: 1. Agree with amiodarone therapy - switch to 400 mg po daily. 2. Add low dose carvedilol 3.125 mg BID (limited due to hypotension). 3.   Avoid anticoagulants and antiplatelets due to pancytopenia 4.   She is +5L since admission - would start gentle diuresis as BP tolerates.  Will likely need a foley cath as she is not ambulatory. Check BNP in am with labs. 5.   Strongly suggest discussing goals of care with the patient and family - given her metastatic CA and cardiomyopathy, her prognosis is poor and hospice care would be very reasonable.  DNR   Time Spent Directly with Patient: 45 minutes  Pixie Casino, MD, [facility] Attending Cardiologist [place] 09/02/2013, 4:03 PM

## 2013-09-03 LAB — BASIC METABOLIC PANEL
BUN: 20 mg/dL (ref 6–23)
BUN: 21 mg/dL (ref 6–23)
CO2: 17 meq/L — AB (ref 19–32)
CO2: 19 mEq/L (ref 19–32)
CREATININE: 0.8 mg/dL (ref 0.50–1.10)
Calcium: 8.2 mg/dL — ABNORMAL LOW (ref 8.4–10.5)
Calcium: 8.5 mg/dL (ref 8.4–10.5)
Chloride: 108 mEq/L (ref 96–112)
Chloride: 110 mEq/L (ref 96–112)
Creatinine, Ser: 0.88 mg/dL (ref 0.50–1.10)
GFR calc Af Amer: 82 mL/min — ABNORMAL LOW (ref >90)
GFR calc Af Amer: 73 mL/min — ABNORMAL LOW (ref >90)
GFR calc non Af Amer: 71 mL/min — ABNORMAL LOW (ref >90)
GFR calc non Af Amer: 63 mL/min — ABNORMAL LOW (ref >90)
GLUCOSE: 96 mg/dL (ref 70–99)
Glucose, Bld: 93 mg/dL (ref 70–99)
Potassium: 6.4 mEq/L — ABNORMAL HIGH (ref 3.7–5.3)
Potassium: 5.9 mEq/L — ABNORMAL HIGH (ref 3.7–5.3)
SODIUM: 137 meq/L (ref 137–147)
Sodium: 140 mEq/L (ref 137–147)

## 2013-09-03 LAB — DIFFERENTIAL
BASOS PCT: 3 % — AB (ref 0–1)
Basophils Absolute: 0.1 10*3/uL (ref 0.0–0.1)
EOS PCT: 0 % (ref 0–5)
Eosinophils Absolute: 0 10*3/uL (ref 0.0–0.7)
LYMPHS PCT: 20 % (ref 12–46)
Lymphs Abs: 0.4 10*3/uL — ABNORMAL LOW (ref 0.7–4.0)
Monocytes Absolute: 0.3 10*3/uL (ref 0.1–1.0)
Monocytes Relative: 15 % — ABNORMAL HIGH (ref 3–12)
Neutro Abs: 1.1 10*3/uL — ABNORMAL LOW (ref 1.7–7.7)
Neutrophils Relative %: 62 % (ref 43–77)

## 2013-09-03 LAB — CBC
HEMATOCRIT: 28.9 % — AB (ref 36.0–46.0)
HEMOGLOBIN: 9.9 g/dL — AB (ref 12.0–15.0)
MCH: 29.8 pg (ref 26.0–34.0)
MCHC: 34.3 g/dL (ref 30.0–36.0)
MCV: 87 fL (ref 78.0–100.0)
Platelets: 44 10*3/uL — ABNORMAL LOW (ref 150–400)
RBC: 3.32 MIL/uL — AB (ref 3.87–5.11)
RDW: 17.3 % — ABNORMAL HIGH (ref 11.5–15.5)
WBC: 1.9 10*3/uL — ABNORMAL LOW (ref 4.0–10.5)

## 2013-09-03 LAB — TYPE AND SCREEN
ABO/RH(D): B POS
Antibody Screen: NEGATIVE
UNIT DIVISION: 0
UNIT DIVISION: 0
Unit division: 0
Unit division: 0

## 2013-09-03 LAB — PRO B NATRIURETIC PEPTIDE: Pro B Natriuretic peptide (BNP): 50074 pg/mL — ABNORMAL HIGH (ref 0–125)

## 2013-09-03 MED ORDER — SODIUM CHLORIDE 0.9 % IV SOLN
INTRAVENOUS | Status: DC
  Administered 2013-09-03 – 2013-09-05 (×4): via INTRAVENOUS

## 2013-09-03 MED ORDER — SODIUM CHLORIDE 0.9 % IV SOLN
1.0000 g | Freq: Once | INTRAVENOUS | Status: AC
  Administered 2013-09-03: 1 g via INTRAVENOUS
  Filled 2013-09-03: qty 10

## 2013-09-03 MED ORDER — INSULIN ASPART 100 UNIT/ML ~~LOC~~ SOLN
10.0000 [IU] | Freq: Once | SUBCUTANEOUS | Status: AC
  Administered 2013-09-03: 10 [IU] via INTRAVENOUS
  Filled 2013-09-03: qty 0.1

## 2013-09-03 MED ORDER — DEXTROSE 50 % IV SOLN
1.0000 | Freq: Once | INTRAVENOUS | Status: AC
  Administered 2013-09-03: 50 mL via INTRAVENOUS
  Filled 2013-09-03: qty 50

## 2013-09-03 MED ORDER — SODIUM POLYSTYRENE SULFONATE 15 GM/60ML PO SUSP
15.0000 g | Freq: Once | ORAL | Status: AC
  Administered 2013-09-03: 15 g via ORAL
  Filled 2013-09-03: qty 60

## 2013-09-03 MED ORDER — RESOURCE INSTANT PROTEIN PO PWD PACKET
1.0000 | Freq: Three times a day (TID) | ORAL | Status: DC
  Administered 2013-09-03 – 2013-09-06 (×4): 6 g via ORAL
  Filled 2013-09-03 (×18): qty 6

## 2013-09-03 NOTE — Progress Notes (Signed)
[facility] PROGRESS NOTE  Rachael Wall ZHG:[id number] DOB: 1941/02/13 DOA: 08/29/2013 PCP: [name], MD  Assessment/Plan: Severe Pancytopenia, Chemotherapy-Induced  -5/10: WBC 0.2, Hb 6.1, Plt <5.  -5/11: WBC 0.4, Hb 9.5, Plt 75 after transfusion of 2 units of PRBCs and 2 units of pheresed platelets.  -5/12: WBC 1.1, Hb 10.8, plt 75.  -Continue neutropenic precautions.  -Has been started on levaquin for prophylaxis (thankfully not febrile).  -Receivied G-CSF.   New Onset A Fib with RVR  -Has converted back to NSR ; amio and B blocker - Cardiology recommended amio and B blocker.  Elevated Troponin  -Denies CP/SOB.  -ECHO with EF 20-25%. NEW.  -Repeat EKG today.  -Card recommending medical management.  [place] Carcinoma on Lung  -Continue OP management with Oncology. (Dr. Julien Nordmann).   Hypernatremia - given calcium gluconate, d 50 and 10 units of novolog insulin - will add kayexalate - continue IVF's  Hyponatremia  -Continue IVF.   Severe Protein-Caloric Malnutrition  -Nutrition consult.  -Continue to encourage PO intake.   Code Status: DNR Family Communication: discussed with daughter at bedside.  Disposition Plan: Pending improvement in status   Consultants:  Oncology  Procedures:  None  Antibiotics:  Levaquin  HPI/Subjective: Pt has no new complaints currently  Objective: Filed Vitals:   09/03/13 1300  BP: 102/56  Pulse: 110  Temp: 97.5 F (36.4 C)  Resp: 24    Intake/Output Summary (Last 24 hours) at 09/03/13 1658 Last data filed at 09/03/13 1300  Gross per 24 hour  Intake   3545 ml  Output    850 ml  Net   2695 ml   Filed Weights   08/24/2013 1727  Weight: 41.8 kg (92 lb 2.4 oz)    Exam:   General:  Pt in NAD, alert and awake  Cardiovascular: RRR, no MRG  Respiratory: CTA BL, no wheezes  Abdomen: soft, NT, ND  Musculoskeletal: no cyanosis or clubbing   Data Reviewed: Basic Metabolic Panel:  Recent Labs Lab  08/31/13 0510 09/01/13 0103 09/01/13 0414 09/03/13 0353 09/03/13 0757  NA 129* 131* 132* 137 140  K 4.1 4.1 3.6* 6.4* 5.9*  CL 98 97 94* 108 110  CO2 18* 15* 18* 17* 19  GLUCOSE 76 159* 206* 96 93  BUN 8 11 12 20 21   CREATININE 0.47* 0.54 0.60 0.80 0.88  CALCIUM 7.1* 7.7* 7.6* 8.2* 8.5  MG  --  0.8*  --   --   --    Liver Function Tests:  Recent Labs Lab 09/09/2013 1252 09/01/13 0103  AST 22 40*  ALT 24 27  ALKPHOS 348* 350*  BILITOT 1.0 2.6*  PROT 5.5* 5.5*  ALBUMIN 2.0* 2.2*    Recent Labs Lab 09/16/2013 1252  LIPASE 22   No results found for this basename: AMMONIA,  in the last 168 hours CBC:  Recent Labs Lab 08/31/13 0510 09/01/13 0103 09/01/13 0414 09/02/13 0424 09/03/13 0353  WBC 0.2* 0.3* 0.4* 1.1* 1.9*  NEUTROABS  --  0.2* 0.2* 0.6* 1.1*  HGB 6.1* 10.1* 9.5* 10.8* 9.9*  HCT 17.5* 28.7* 26.3* 30.5* 28.9*  MCV 82.9 84.2 84.0 85.0 87.0  PLT <5* 93* 75* 75* 44*   Cardiac Enzymes:  Recent Labs Lab 09/01/13 0103 09/01/13 0745 09/01/13 1411  TROPONINI 0.97* 0.58* 0.62*   BNP (last 3 results)  Recent Labs  09/03/13 0404  PROBNP 50074.0*   CBG: No results found for this basename: GLUCAP,  in the last 168 hours  Recent  Results (from the past 240 hour(s))  TECHNOLOGIST REVIEW     Status: None   Collection Time    08/26/13  9:07 AM      Result Value Ref Range Status   Technologist Review few lymphs, oc mono   Final  MRSA PCR SCREENING     Status: None   Collection Time    09/01/13  2:09 AM      Result Value Ref Range Status   MRSA by PCR NEGATIVE  NEGATIVE Final   Comment:            The GeneXpert MRSA Assay (FDA     approved for NASAL specimens     only), is one component of a     comprehensive MRSA colonization     surveillance program. It is not     intended to diagnose MRSA     infection nor to guide or     monitor treatment for     MRSA infections.     Studies: No results found.  Scheduled Meds: . allopurinol  100 mg Oral  BID  . amiodarone  400 mg Oral Daily  . antiseptic oral rinse  15 mL Mouth Rinse q12n4p  . budesonide-formoterol  2 puff Inhalation BID  . carvedilol  3.125 mg Oral BID WC  . dronabinol  2.5 mg Oral BID AC  . fluconazole  200 mg Oral Daily  . furosemide  40 mg Intravenous Daily  . lactose free nutrition  237 mL Oral TID BM  . levofloxacin  500 mg Oral Daily  . multivitamin with minerals  1 tablet Oral Daily  . pantoprazole  40 mg Oral BID  . polyethylene glycol  17 g Oral Daily  . potassium chloride  40 mEq Oral Once  . protein supplement  1 scoop Oral TID WC  . sucralfate  1 g Oral TID WC & HS   Continuous Infusions: . sodium chloride 100 mL/hr at 09/03/13 0602  . amiodarone 30 mg/hr (09/01/13 0745)    Time spent: > 35 minutes    [facility] Pager [phone] If 7PM-7AM, please contact night-coverage at [email or web address], password [facility] 09/03/2013, 4:58 PM  LOS: 4 days

## 2013-09-03 NOTE — Progress Notes (Signed)
Comments:  Notified by RN that AM K+ 6.4 from 3.6 on 5/11. BUN/Creatinine 20/0.80. Pt sleeping. IV NS w/ 40 meq KCL d/c'd. Started NS at 100cc/hr. Obtained EKG which revealed t-wave changes more pronounced than previous EKG (consulted w/ Dr Hal Hope). Calcium Gluconate 1gm IV, D50 1 amp and Novolog insulin 10 u IV ordered to be given stat. RN to place order for f/u b-met after treatment completed. Will continue to monitor closely.   Jeryl Columbia, NP-C [facility] Pager [phone]

## 2013-09-03 NOTE — Progress Notes (Signed)
Subjective:  Pt is chronically ill appearing and minimally communicative but denies acute distress  Objective:  Temp:  [97.9 F (36.6 C)-98.1 F (36.7 C)] 98.1 F (36.7 C) (05/13 0554) Pulse Rate:  [103-115] 103 (05/13 0554) Resp:  [22-28] 22 (05/13 0554) BP: (95-107)/(66-73) 95/73 mmHg (05/13 0554) SpO2:  [97 %-100 %] 100 % (05/13 0554) Weight change:   Intake/Output from previous day: 05/12 0701 - 05/13 0700 In: 5625 [P.O.:120; I.V.:1635] Out: 850 [Urine:850]  Intake/Output from this shift:    Physical Exam: General appearance: slowed mentation and Chronically ill appearing and frail Neck: no adenopathy, no carotid bruit, no JVD, supple, symmetrical, trachea midline and thyroid not enlarged, symmetric, no tenderness/mass/nodules Lungs: clear to auscultation bilaterally Heart: regular rate and rhythm, S1, S2 normal, no murmur, click, rub or gallop Extremities: extremities normal, atraumatic, no cyanosis or edema  Lab Results: Results for orders placed during the hospital encounter of 09/02/2013 (from the past 48 hour(s))  TROPONIN I     Status: Abnormal   Collection Time    09/01/13  7:45 AM      Result Value Ref Range   Troponin I 0.58 (*) <0.30 ng/mL   Comment:            Due to the release kinetics of cTnI,     a negative result within the first hours     of the onset of symptoms does not rule out     myocardial infarction with certainty.     If myocardial infarction is still suspected,     repeat the test at appropriate intervals.     CRITICAL VALUE NOTED.  VALUE IS CONSISTENT WITH PREVIOUSLY REPORTED AND CALLED VALUE.  TROPONIN I     Status: Abnormal   Collection Time    09/01/13  2:11 PM      Result Value Ref Range   Troponin I 0.62 (*) <0.30 ng/mL   Comment:            Due to the release kinetics of cTnI,     a negative result within the first hours     of the onset of symptoms does not rule out     myocardial infarction with certainty.     If  myocardial infarction is still suspected,     repeat the test at appropriate intervals.     REPEATED TO VERIFY     SETZER A RN [place] [id number] 09/01/13 [place],V     CRITICAL RESULT CALLED TO, READ BACK BY AND VERIFIED WITH:  DIFFERENTIAL     Status: Abnormal   Collection Time    09/02/13  4:24 AM      Result Value Ref Range   Neutrophils Relative % 56  43 - 77 %   Lymphocytes Relative 25  12 - 46 %   Monocytes Relative 18 (*) 3 - 12 %   Eosinophils Relative 0  0 - 5 %   Basophils Relative 1  0 - 1 %   Neutro Abs 0.6 (*) 1.7 - 7.7 K/uL   Lymphs Abs 0.3 (*) 0.7 - 4.0 K/uL   Monocytes Absolute 0.2  0.1 - 1.0 K/uL   Eosinophils Absolute 0.0  0.0 - 0.7 K/uL   Basophils Absolute 0.0  0.0 - 0.1 K/uL   WBC Morphology MILD LEFT SHIFT (1-5% METAS, OCC MYELO, OCC BANDS)     Comment: DOHLE BODIES  CBC  Status: Abnormal   Collection Time    09/02/13  4:24 AM      Result Value Ref Range   WBC 1.1 (*) 4.0 - 10.5 K/uL   Comment: REPEATED TO VERIFY     CRITICAL VALUE NOTED.  VALUE IS CONSISTENT WITH PREVIOUSLY REPORTED AND CALLED VALUE.   RBC 3.59 (*) 3.87 - 5.11 MIL/uL   Hemoglobin 10.8 (*) 12.0 - 15.0 g/dL   HCT 30.5 (*) 36.0 - 46.0 %   MCV 85.0  78.0 - 100.0 fL   MCH 30.1  26.0 - 34.0 pg   MCHC 35.4  30.0 - 36.0 g/dL   RDW 15.9 (*) 11.5 - 15.5 %   Platelets 75 (*) 150 - 400 K/uL   Comment: REPEATED TO VERIFY     CONSISTENT WITH PREVIOUS RESULT  DIFFERENTIAL     Status: Abnormal   Collection Time    09/03/13  3:53 AM      Result Value Ref Range   Neutrophils Relative % 62  43 - 77 %   Lymphocytes Relative 20  12 - 46 %   Monocytes Relative 15 (*) 3 - 12 %   Eosinophils Relative 0  0 - 5 %   Basophils Relative 3 (*) 0 - 1 %   Neutro Abs 1.1 (*) 1.7 - 7.7 K/uL   Lymphs Abs 0.4 (*) 0.7 - 4.0 K/uL   Monocytes Absolute 0.3  0.1 - 1.0 K/uL   Eosinophils Absolute 0.0  0.0 - 0.7 K/uL   Basophils Absolute 0.1  0.0 - 0.1 K/uL   WBC Morphology TOXIC GRANULATION      Comment: MILD LEFT SHIFT (1-5% METAS, OCC MYELO, OCC BANDS)  CBC     Status: Abnormal   Collection Time    09/03/13  3:53 AM      Result Value Ref Range   WBC 1.9 (*) 4.0 - 10.5 K/uL   RBC 3.32 (*) 3.87 - 5.11 MIL/uL   Hemoglobin 9.9 (*) 12.0 - 15.0 g/dL   HCT 28.9 (*) 36.0 - 46.0 %   MCV 87.0  78.0 - 100.0 fL   MCH 29.8  26.0 - 34.0 pg   MCHC 34.3  30.0 - 36.0 g/dL   RDW 17.3 (*) 11.5 - 15.5 %   Platelets 44 (*) 150 - 400 K/uL   Comment: REPEATED TO VERIFY     DELTA CHECK NOTED     PLATELET COUNT CONFIRMED BY SMEAR     SPECIMEN CHECKED FOR CLOTS  BASIC METABOLIC PANEL     Status: Abnormal   Collection Time    09/03/13  3:53 AM      Result Value Ref Range   Sodium 137  137 - 147 mEq/L   Potassium 6.4 (*) 3.7 - 5.3 mEq/L   Comment: NO VISIBLE HEMOLYSIS     REPEATED TO VERIFY     DELTA CHECK NOTED   Chloride 108  96 - 112 mEq/L   Comment: DELTA CHECK NOTED   CO2 17 (*) 19 - 32 mEq/L   Glucose, Bld 96  70 - 99 mg/dL   BUN 20  6 - 23 mg/dL   Creatinine, Ser 0.80  0.50 - 1.10 mg/dL   Calcium 8.2 (*) 8.4 - 10.5 mg/dL   GFR calc non Af Amer 71 (*) >90 mL/min   GFR calc Af Amer 82 (*) >90 mL/min   Comment: (NOTE)     The eGFR has been calculated using the CKD EPI equation.  This calculation has not been validated in all clinical situations.     eGFR's persistently <90 mL/min signify possible Chronic Kidney     Disease.  PRO B NATRIURETIC PEPTIDE     Status: Abnormal   Collection Time    09/03/13  4:04 AM      Result Value Ref Range   Pro B Natriuretic peptide (BNP) 50074.0 (*) 0 - 125 pg/mL    Imaging: Imaging results have been reviewed  Assessment/Plan:   1. Principal Problem: 2.   Pancytopenia 3. Active Problems: 4.   Hyponatremia 5.   Metastatic lung carcinoma 6.   Emphysema lung 7.   Protein-calorie malnutrition, severe 8.   Non-small cell cancer of right lung 9.   Hypokalemia 10.   Acute systolic CHF (congestive heart failure) 11.   PAF (paroxysmal  atrial fibrillation) 12.   Time Spent Directly with Patient:  15 minutes  Length of Stay:  LOS: 4 days   Pt back in NSR/ST on PO amio and BB. Good diuresis. EF 20-25% prob NISCM ? Related to Chemo. Exam benign. Pt is a DNR. Agree with Dr. Lysbeth Penner assessment about Palliative care. Prognosis poor. Nothing further to add. Rx limited by low BP. Will S/O . Call with further questions. Thanks.   Lorretta Harp 09/03/2013, 7:44 AM

## 2013-09-03 NOTE — Progress Notes (Signed)
Patient potassium 6.4 this a.m. Patient resting and VSS. Schorr, NP on call notified via text page. Page returned with orders to obtain EKG and to d/c IVF NS w/ 40 mEq KCL and start NS at 100 ml/hr. EKG revealed t-wave changes. Schorr placed new orders via epic for calcium gluconate 1gm IV, D50 1 amp, and Novolog Insulin 10 units IV to be given stat. Orders carried out stat by RN. Patient is resting and in no acute distress. Will continue to monitor.

## 2013-09-04 ENCOUNTER — Encounter (HOSPITAL_COMMUNITY): Payer: Self-pay | Admitting: Radiology

## 2013-09-04 LAB — BASIC METABOLIC PANEL
BUN: 23 mg/dL (ref 6–23)
CO2: 18 meq/L — AB (ref 19–32)
CREATININE: 0.91 mg/dL (ref 0.50–1.10)
Calcium: 8.5 mg/dL (ref 8.4–10.5)
Chloride: 106 mEq/L (ref 96–112)
GFR calc Af Amer: 70 mL/min — ABNORMAL LOW (ref >90)
GFR calc non Af Amer: 61 mL/min — ABNORMAL LOW (ref >90)
Glucose, Bld: 102 mg/dL — ABNORMAL HIGH (ref 70–99)
Potassium: 4.5 mEq/L (ref 3.7–5.3)
Sodium: 138 mEq/L (ref 137–147)

## 2013-09-04 LAB — DIFFERENTIAL
BASOS PCT: 1 % (ref 0–1)
Basophils Absolute: 0 10*3/uL (ref 0.0–0.1)
EOS ABS: 0 10*3/uL (ref 0.0–0.7)
EOS PCT: 0 % (ref 0–5)
LYMPHS PCT: 10 % — AB (ref 12–46)
Lymphs Abs: 0.5 10*3/uL — ABNORMAL LOW (ref 0.7–4.0)
Monocytes Absolute: 0.4 10*3/uL (ref 0.1–1.0)
Monocytes Relative: 9 % (ref 3–12)
NEUTROS ABS: 3.8 10*3/uL (ref 1.7–7.7)
NEUTROS PCT: 80 % — AB (ref 43–77)

## 2013-09-04 LAB — CBC
HEMATOCRIT: 28.5 % — AB (ref 36.0–46.0)
Hemoglobin: 9.6 g/dL — ABNORMAL LOW (ref 12.0–15.0)
MCH: 29.9 pg (ref 26.0–34.0)
MCHC: 33.7 g/dL (ref 30.0–36.0)
MCV: 88.8 fL (ref 78.0–100.0)
Platelets: 37 10*3/uL — ABNORMAL LOW (ref 150–400)
RBC: 3.21 MIL/uL — AB (ref 3.87–5.11)
RDW: 17.7 % — ABNORMAL HIGH (ref 11.5–15.5)
WBC: 4.7 10*3/uL (ref 4.0–10.5)

## 2013-09-04 LAB — OCCULT BLOOD X 1 CARD TO LAB, STOOL: Fecal Occult Bld: NEGATIVE

## 2013-09-04 NOTE — Progress Notes (Addendum)
[facility] PROGRESS NOTE  Rachael Wall OXB:[id number] DOB: 11-Dec-1940 DOA: 09/18/2013 PCP: [name], MD  Assessment/Plan: Severe Pancytopenia, Chemotherapy-Induced  -5/10: WBC 0.2, Hb 6.1, Plt <5.  -5/11: WBC 0.4, Hb 9.5, Plt 75 after transfusion of 2 units of PRBCs and 2 units of pheresed platelets.  -5/12: WBC 1.1, Hb 10.8, plt 75.  -Continue neutropenic precautions.  -Has been started on levaquin for prophylaxis  -Receivied G-CSF.   New Onset A Fib with RVR  -Has converted back to NSR ; amio and B blocker - Cardiology recommended amio and B blocker.  Elevated Troponin  -Denies CP/SOB.  -ECHO with EF 20-25%. NEW.  -Repeat EKG today.  -Card recommending medical management.  [place] Carcinoma on Lung  -Continue OP management with Oncology. (Dr. Julien Nordmann).   Addendum: Hyperkalemia - given calcium gluconate, d 50 and 10 units of novolog insulin - will add kayexalate - continue IVF's  Hyponatremia  -Continue IVF.   Severe Protein-Caloric Malnutrition  -Nutrition consult.  -Continue to encourage PO intake. - Given continued poor oral intake. We'll plan on proceeding with G-tube insertion per family and patient's wishes. Consulted IR for placement - Thrombocytopenia problem and as such patient will require transfusion. Will transfuse 1 unit of platelets today and reassess next a.m.   Code Status: DNR Family Communication: discussed with daughter at bedside.  Disposition Plan: With continued improvement and after PEG placement   Consultants:  Oncology  Interventional radiology  Procedures:  None  Antibiotics:  Levaquin  HPI/Subjective: Pt has no new complaints currently. Family and patient had lots of questions regarding G-tube. I have answered their questions to their satisfaction and currently there wish to proceed with procedure.  Objective: Filed Vitals:   09/04/13 1315  BP: 110/72  Pulse: 105  Temp: 97.4 F (36.3 C)  Resp: 22     Intake/Output Summary (Last 24 hours) at 09/04/13 1358 Last data filed at 09/04/13 0600  Gross per 24 hour  Intake   2420 ml  Output    675 ml  Net   1745 ml   Filed Weights   08/29/2013 1727 09/03/13 1917 09/04/13 0509  Weight: 41.8 kg (92 lb 2.4 oz) 51.8 kg (114 lb 3.2 oz) 49.5 kg (109 lb 2 oz)    Exam:   General:  Pt in NAD, alert and awake  Cardiovascular: RRR, no MRG  Respiratory: CTA BL, no wheezes  Abdomen: soft, NT, ND  Musculoskeletal: no cyanosis or clubbing   Data Reviewed: Basic Metabolic Panel:  Recent Labs Lab 09/01/13 0103 09/01/13 0414 09/03/13 0353 09/03/13 0757 09/04/13 0355  NA 131* 132* 137 140 138  K 4.1 3.6* 6.4* 5.9* 4.5  CL 97 94* 108 110 106  CO2 15* 18* 17* 19 18*  GLUCOSE 159* 206* 96 93 102*  BUN 11 12 20 21 23   CREATININE 0.54 0.60 0.80 0.88 0.91  CALCIUM 7.7* 7.6* 8.2* 8.5 8.5  MG 0.8*  --   --   --   --    Liver Function Tests:  Recent Labs Lab 09/10/2013 1252 09/01/13 0103  AST 22 40*  ALT 24 27  ALKPHOS 348* 350*  BILITOT 1.0 2.6*  PROT 5.5* 5.5*  ALBUMIN 2.0* 2.2*    Recent Labs Lab 09/10/2013 1252  LIPASE 22   No results found for this basename: AMMONIA,  in the last 168 hours CBC:  Recent Labs Lab 09/01/13 0103 09/01/13 0414 09/02/13 0424 09/03/13 0353 09/04/13 0355  WBC 0.3* 0.4* 1.1* 1.9* 4.7  NEUTROABS 0.2* 0.2* 0.6* 1.1* 3.8  HGB 10.1* 9.5* 10.8* 9.9* 9.6*  HCT 28.7* 26.3* 30.5* 28.9* 28.5*  MCV 84.2 84.0 85.0 87.0 88.8  PLT 93* 75* 75* 44* 37*   Cardiac Enzymes:  Recent Labs Lab 09/01/13 0103 09/01/13 0745 09/01/13 1411  TROPONINI 0.97* 0.58* 0.62*   BNP (last 3 results)  Recent Labs  09/03/13 0404  PROBNP 50074.0*   CBG: No results found for this basename: GLUCAP,  in the last 168 hours  Recent Results (from the past 240 hour(s))  TECHNOLOGIST REVIEW     Status: None   Collection Time    08/26/13  9:07 AM      Result Value Ref Range Status   Technologist Review few  lymphs, oc mono   Final  MRSA PCR SCREENING     Status: None   Collection Time    09/01/13  2:09 AM      Result Value Ref Range Status   MRSA by PCR NEGATIVE  NEGATIVE Final   Comment:            The GeneXpert MRSA Assay (FDA     approved for NASAL specimens     only), is one component of a     comprehensive MRSA colonization     surveillance program. It is not     intended to diagnose MRSA     infection nor to guide or     monitor treatment for     MRSA infections.     Studies: No results found.  Scheduled Meds: . allopurinol  100 mg Oral BID  . amiodarone  400 mg Oral Daily  . antiseptic oral rinse  15 mL Mouth Rinse q12n4p  . budesonide-formoterol  2 puff Inhalation BID  . carvedilol  3.125 mg Oral BID WC  . dronabinol  2.5 mg Oral BID AC  . fluconazole  200 mg Oral Daily  . furosemide  40 mg Intravenous Daily  . lactose free nutrition  237 mL Oral TID BM  . levofloxacin  500 mg Oral Daily  . multivitamin with minerals  1 tablet Oral Daily  . pantoprazole  40 mg Oral BID  . polyethylene glycol  17 g Oral Daily  . protein supplement  1 scoop Oral TID WC  . sucralfate  1 g Oral TID WC & HS   Continuous Infusions: . sodium chloride 50 mL/hr at 09/04/13 1130    Time spent: > 55 minutes. More time than usual spent answering patient's and daughter's questions    [facility] Pager [phone] If 7PM-7AM, please contact night-coverage at [email or web address], password [facility] 09/04/2013, 1:58 PM  LOS: 5 days

## 2013-09-04 NOTE — Progress Notes (Signed)
IV site LUA is w/o edema or pain at site, but pt has increasing edema noted to elbow & Left hand, IV fld's stopped & capped this site & flushed with NS, which flushes well & w/o discomfort to the pt. New IV site obtained in RAW area & fld's of NS at 100cc/hr  Started to this site. Warm moist compress applied to LEFT arm edema & elevated on pillow.

## 2013-09-04 NOTE — H&P (Signed)
Referring Physician: Dr. Wendee Beavers HPI: Rachael Wall is an 73 y.o. female with malnutrition, IR received request for image guided percutaneous gastrostomy tube placement with moderate sedation. The patient has metastatic non small cell lung cancer s/p left suboccipital craniectomy, pancytopenia- chemotherapy induced, plt 37k today, received (1) unit of platelets, will check CBC in am and have (2) units prepared for procedure. She denies any chest pain or palpitations. She denies any active signs of bleeding or excessive bruising. She denies any recent fever or chills. The patient denies any history of sleep apnea or chronic oxygen use, but is on 4L here. She has previously tolerated sedation without complications during a colonoscopy.    Past Medical History:  Past Medical History  Diagnosis Date  . Diverticulitis   . 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 Suboccipital craniectomy cervical laminectomy N/A 07/07/2013    Procedure: Left suboccipital craniectomy for resection of tumor ;  Surgeon: Ophelia Charter, MD;  Location: [facility];  Service: Neurosurgery;  Laterality: N/A;    Family History: History reviewed. No pertinent family history.  Social History:  reports that she quit smoking about 6 months ago. She does not have any smokeless tobacco history on file. She reports that she does not drink alcohol or use illicit drugs.  Allergies: No Known Allergies  Medications:   Medication List    ASK your doctor about these medications       acetaminophen 500 MG tablet  Commonly known as:  TYLENOL  Take 500 mg by mouth every 6 (six) hours as needed for mild pain.     allopurinol 100 MG tablet  Commonly known as:  ZYLOPRIM  Take 1 tablet (100 mg total) by mouth 2 (two) times daily.     budesonide-formoterol 160-4.5 MCG/ACT inhaler  Commonly known as:  SYMBICORT  Inhale 2 puffs  into the lungs 2 (two) times daily.     dexamethasone 4 MG tablet  Commonly known as:  DECADRON  Take 4 mg by mouth daily.     feeding supplement (ENSURE COMPLETE) Liqd  Take 237 mLs by mouth 2 (two) times daily between meals.     lactose free nutrition Liqd  Take 237 mLs by mouth 3 (three) times daily between meals.     fluconazole 200 MG tablet  Commonly known as:  DIFLUCAN  Take 1 tablet (200 mg total) by mouth daily.     furosemide 20 MG tablet  Commonly known as:  LASIX  Take 1 tablet (20 mg total) by mouth daily. Every other day, if needed for ankle swelling.     KLOR-CON M20 20 MEQ tablet  Generic drug:  potassium chloride SA  Take 40 mEq by mouth daily.     levofloxacin 500 MG tablet  Commonly known as:  LEVAQUIN  Take 1 tablet (500 mg total) by mouth daily.     loratadine 10 MG tablet  Commonly known as:  CLARITIN  Take 10 mg by mouth daily. For 5 days during chemo     NEULASTA 6 MG/0.6ML injection  Generic drug:  pegfilgrastim  Inject 6 mg into the skin once.     ondansetron 4 MG tablet  Commonly known as:  ZOFRAN  Take 4 mg by mouth 2 (two) times daily.     oxyCODONE-acetaminophen 5-325 MG per tablet  Commonly known as:  PERCOCET/ROXICET  Take 1-2  tablets by mouth every 6 (six) hours as needed for severe pain.     pantoprazole 40 MG tablet  Commonly known as:  PROTONIX  Take 40 mg by mouth 2 (two) times daily.     PRESCRIPTION MEDICATION  etoposide (VEPESID) 100 mg in sodium chloride 0.9 % 250 mL chemo infusion 80 mg/m2  1.3 m2 (Treatment Plan Actual)  Once 08/21/2013     PRESCRIPTION MEDICATION  CARBOplatin (PARAPLATIN) 280 mg in sodium chloride 0.9 % 100 mL chemo infusion 280 mg  Once 08/19/2013     prochlorperazine 10 MG tablet  Commonly known as:  COMPAZINE  Take 1 tablet (10 mg total) by mouth every 6 (six) hours as needed for nausea or vomiting.     protein supplement Powd  Take 6 g by mouth 3 (three) times daily with meals.     RADIAPLEX  EX  Apply 1 application topically 2 (two) times daily as needed (For back and chest).     sucralfate 1 G tablet  Commonly known as:  CARAFATE  Take 1 tablet (1 g total) by mouth 4 (four) times daily -  with meals and at bedtime. 5 minutes before meal for esophagitis       Please HPI for pertinent positives, otherwise complete 10 system ROS negative.  Physical Exam: BP 113/72  Pulse 106  Temp(Src) 97.5 F (36.4 C) (Axillary)  Resp 24  Ht _0  (1.499 m)  Wt 109 lb 2 oz (49.5 kg)  BMI 22.03 kg/m2  SpO2 92% Body mass index is 22.03 kg/(m^2).  General Appearance:  Alert, cooperative, no distress  Head:  Normocephalic, without obvious abnormality, atraumatic  Neck: Supple, symmetrical, trachea midline  Lungs:   Clear to auscultation bilaterally, no w/r/r, respirations unlabored without use of accessory muscles.  Chest Wall:  No tenderness or deformity  Heart:  Regular rate and rhythm, S1, S2 normal, no murmur, rub or gallop.  Abdomen:   Soft, non-tender, non distended, (+) BS  Extremities: Extremities normal, atraumatic, no cyanosis or edema  Neurologic: Normal affect, no gross deficits.   Results for orders placed during the hospital encounter of 08/22/2013 (from the past 48 hour(s))  DIFFERENTIAL     Status: Abnormal   Collection Time    09/03/13  3:53 AM      Result Value Ref Range   Neutrophils Relative % 62  43 - 77 %   Lymphocytes Relative 20  12 - 46 %   Monocytes Relative 15 (*) 3 - 12 %   Eosinophils Relative 0  0 - 5 %   Basophils Relative 3 (*) 0 - 1 %   Neutro Abs 1.1 (*) 1.7 - 7.7 K/uL   Lymphs Abs 0.4 (*) 0.7 - 4.0 K/uL   Monocytes Absolute 0.3  0.1 - 1.0 K/uL   Eosinophils Absolute 0.0  0.0 - 0.7 K/uL   Basophils Absolute 0.1  0.0 - 0.1 K/uL   WBC Morphology TOXIC GRANULATION     Comment: MILD LEFT SHIFT (1-5% METAS, OCC MYELO, OCC BANDS)  CBC     Status: Abnormal   Collection Time    09/03/13  3:53 AM      Result Value Ref Range   WBC 1.9 (*) 4.0 - 10.5  K/uL   RBC 3.32 (*) 3.87 - 5.11 MIL/uL   Hemoglobin 9.9 (*) 12.0 - 15.0 g/dL   HCT 28.9 (*) 36.0 - 46.0 %   MCV 87.0  78.0 - 100.0 fL   MCH 29.8  26.0 - 34.0 pg   MCHC 34.3  30.0 - 36.0 g/dL   RDW 17.3 (*) 11.5 - 15.5 %   Platelets 44 (*) 150 - 400 K/uL   Comment: REPEATED TO VERIFY     DELTA CHECK NOTED     PLATELET COUNT CONFIRMED BY SMEAR     SPECIMEN CHECKED FOR CLOTS  BASIC METABOLIC PANEL     Status: Abnormal   Collection Time    09/03/13  3:53 AM      Result Value Ref Range   Sodium 137  137 - 147 mEq/L   Potassium 6.4 (*) 3.7 - 5.3 mEq/L   Comment: NO VISIBLE HEMOLYSIS     REPEATED TO VERIFY     DELTA CHECK NOTED   Chloride 108  96 - 112 mEq/L   Comment: DELTA CHECK NOTED   CO2 17 (*) 19 - 32 mEq/L   Glucose, Bld 96  70 - 99 mg/dL   BUN 20  6 - 23 mg/dL   Creatinine, Ser 0.80  0.50 - 1.10 mg/dL   Calcium 8.2 (*) 8.4 - 10.5 mg/dL   GFR calc non Af Amer 71 (*) >90 mL/min   GFR calc Af Amer 82 (*) >90 mL/min   Comment: (NOTE)     The eGFR has been calculated using the CKD EPI equation.     This calculation has not been validated in all clinical situations.     eGFR's persistently <90 mL/min signify possible Chronic Kidney     Disease.  PRO B NATRIURETIC PEPTIDE     Status: Abnormal   Collection Time    09/03/13  4:04 AM      Result Value Ref Range   Pro B Natriuretic peptide (BNP) 50074.0 (*) 0 - 125 pg/mL  BASIC METABOLIC PANEL     Status: Abnormal   Collection Time    09/03/13  7:57 AM      Result Value Ref Range   Sodium 140  137 - 147 mEq/L   Potassium 5.9 (*) 3.7 - 5.3 mEq/L   Chloride 110  96 - 112 mEq/L   CO2 19  19 - 32 mEq/L   Glucose, Bld 93  70 - 99 mg/dL   BUN 21  6 - 23 mg/dL   Creatinine, Ser 0.88  0.50 - 1.10 mg/dL   Calcium 8.5  8.4 - 10.5 mg/dL   GFR calc non Af Amer 63 (*) >90 mL/min   GFR calc Af Amer 73 (*) >90 mL/min   Comment: (NOTE)     The eGFR has been calculated using the CKD EPI equation.     This calculation has not been  validated in all clinical situations.     eGFR's persistently <90 mL/min signify possible Chronic Kidney     Disease.  DIFFERENTIAL     Status: Abnormal   Collection Time    09/04/13  3:55 AM      Result Value Ref Range   Neutrophils Relative % 80 (*) 43 - 77 %   Lymphocytes Relative 10 (*) 12 - 46 %   Monocytes Relative 9  3 - 12 %   Eosinophils Relative 0  0 - 5 %   Basophils Relative 1  0 - 1 %   Neutro Abs 3.8  1.7 - 7.7 K/uL   Lymphs Abs 0.5 (*) 0.7 - 4.0 K/uL   Monocytes Absolute 0.4  0.1 - 1.0 K/uL   Eosinophils Absolute 0.0  0.0 - 0.7 K/uL  Basophils Absolute 0.0  0.0 - 0.1 K/uL   WBC Morphology MILD LEFT SHIFT (1-5% METAS, OCC MYELO, OCC BANDS)     Comment: TOXIC GRANULATION     DOHLE BODIES  CBC     Status: Abnormal   Collection Time    09/04/13  3:55 AM      Result Value Ref Range   WBC 4.7  4.0 - 10.5 K/uL   RBC 3.21 (*) 3.87 - 5.11 MIL/uL   Hemoglobin 9.6 (*) 12.0 - 15.0 g/dL   HCT 28.5 (*) 36.0 - 46.0 %   MCV 88.8  78.0 - 100.0 fL   MCH 29.9  26.0 - 34.0 pg   MCHC 33.7  30.0 - 36.0 g/dL   RDW 17.7 (*) 11.5 - 15.5 %   Platelets 37 (*) 150 - 400 K/uL   Comment: CONSISTENT WITH PREVIOUS RESULT  BASIC METABOLIC PANEL     Status: Abnormal   Collection Time    09/04/13  3:55 AM      Result Value Ref Range   Sodium 138  137 - 147 mEq/L   Potassium 4.5  3.7 - 5.3 mEq/L   Comment: DELTA CHECK NOTED     REPEATED TO VERIFY   Chloride 106  96 - 112 mEq/L   CO2 18 (*) 19 - 32 mEq/L   Glucose, Bld 102 (*) 70 - 99 mg/dL   BUN 23  6 - 23 mg/dL   Creatinine, Ser 0.91  0.50 - 1.10 mg/dL   Calcium 8.5  8.4 - 10.5 mg/dL   GFR calc non Af Amer 61 (*) >90 mL/min   GFR calc Af Amer 70 (*) >90 mL/min   Comment: (NOTE)     The eGFR has been calculated using the CKD EPI equation.     This calculation has not been validated in all clinical situations.     eGFR's persistently <90 mL/min signify possible Chronic Kidney     Disease.  OCCULT BLOOD X 1 CARD TO LAB, STOOL      Status: None   Collection Time    09/04/13  5:17 AM      Result Value Ref Range   Fecal Occult Bld NEGATIVE  NEGATIVE  PREPARE PLATELET PHERESIS     Status: None   Collection Time    09/04/13 11:30 AM      Result Value Ref Range   Unit Number [id number]     Blood Component Type PLTPHER LRI1     Unit division 00     Status of Unit REL FROM [facility]     Transfusion Status OK TO TRANSFUSE     Unit Number [id number]     Blood Component Type PLTPHER LR1     Unit division 00     Status of Unit ISSUED     Transfusion Status OK TO TRANSFUSE     No results found.  Assessment/Plan Malnutrition, request for image guided percutaneous gastrostomy tube placement with moderate sedation. Pancytopenia, chemotherapy induced, plt 37k today, received (1) unit of platelets, will check CBC in am and have (2) units prepared for procedure. Metastatic non small cell lung cancer s/p left suboccipital craniectomy. Atrial fibrillation, NSR now on amiodarone and betablocker.  Patient will be NPO after midnight, she would like to have an enema tomorrow during the procedure rather than a NGT with barium, she is on levaquin and no blood thinners. Risks and Benefits discussed with the patient. All of the patient's questions were answered, patient is  agreeable to proceed. Consent signed and in chart.  Hedy Jacob PA-C 09/04/2013, 3:32 PM

## 2013-09-05 ENCOUNTER — Inpatient Hospital Stay (HOSPITAL_COMMUNITY): Payer: Medicare PPO

## 2013-09-05 LAB — DIFFERENTIAL
BASOS PCT: 1 % (ref 0–1)
Basophils Absolute: 0.1 10*3/uL (ref 0.0–0.1)
EOS ABS: 0 10*3/uL (ref 0.0–0.7)
Eosinophils Relative: 0 % (ref 0–5)
LYMPHS ABS: 0.6 10*3/uL — AB (ref 0.7–4.0)
LYMPHS PCT: 6 % — AB (ref 12–46)
Monocytes Absolute: 0.7 10*3/uL (ref 0.1–1.0)
Monocytes Relative: 7 % (ref 3–12)
Neutro Abs: 8.4 10*3/uL — ABNORMAL HIGH (ref 1.7–7.7)
Neutrophils Relative %: 86 % — ABNORMAL HIGH (ref 43–77)

## 2013-09-05 LAB — PROTIME-INR
INR: 1.49 (ref 0.00–1.49)
Prothrombin Time: 17.6 seconds — ABNORMAL HIGH (ref 11.6–15.2)

## 2013-09-05 LAB — CBC
HCT: 26.2 % — ABNORMAL LOW (ref 36.0–46.0)
HEMOGLOBIN: 9 g/dL — AB (ref 12.0–15.0)
MCH: 30.1 pg (ref 26.0–34.0)
MCHC: 34.4 g/dL (ref 30.0–36.0)
MCV: 87.6 fL (ref 78.0–100.0)
Platelets: 69 10*3/uL — ABNORMAL LOW (ref 150–400)
RBC: 2.99 MIL/uL — ABNORMAL LOW (ref 3.87–5.11)
RDW: 17.7 % — ABNORMAL HIGH (ref 11.5–15.5)
WBC: 9.8 10*3/uL (ref 4.0–10.5)

## 2013-09-05 LAB — PREPARE PLATELET PHERESIS
Unit division: 0
Unit division: 0

## 2013-09-05 MED ORDER — FENTANYL CITRATE 0.05 MG/ML IJ SOLN
INTRAMUSCULAR | Status: AC
  Filled 2013-09-05: qty 6

## 2013-09-05 MED ORDER — LIDOCAINE VISCOUS 2 % MT SOLN
15.0000 mL | Freq: Once | OROMUCOSAL | Status: AC
  Administered 2013-09-05: 15 mL via OROMUCOSAL

## 2013-09-05 MED ORDER — GLUCAGON HCL (RDNA) 1 MG IJ SOLR
INTRAMUSCULAR | Status: AC
  Filled 2013-09-05: qty 1

## 2013-09-05 MED ORDER — LIDOCAINE VISCOUS 2 % MT SOLN
OROMUCOSAL | Status: AC
  Filled 2013-09-05: qty 15

## 2013-09-05 MED ORDER — FUROSEMIDE 10 MG/ML IJ SOLN
40.0000 mg | Freq: Once | INTRAMUSCULAR | Status: AC
  Administered 2013-09-05: 40 mg via INTRAVENOUS
  Filled 2013-09-05: qty 4

## 2013-09-05 MED ORDER — MIDAZOLAM HCL 2 MG/2ML IJ SOLN
INTRAMUSCULAR | Status: AC
  Filled 2013-09-05: qty 6

## 2013-09-05 MED ORDER — IOHEXOL 300 MG/ML  SOLN
10.0000 mL | Freq: Once | INTRAMUSCULAR | Status: AC | PRN
Start: 2013-09-05 — End: 2013-09-05
  Administered 2013-09-05: 10 mL

## 2013-09-05 NOTE — Progress Notes (Addendum)
[facility] PROGRESS NOTE  Rachael Wall NFA:[id number] DOB: 1940-07-11 DOA: 08/24/2013 PCP: [name], MD  Assessment/Plan: Severe Pancytopenia, Chemotherapy-Induced  -5/10: WBC 0.2, Hb 6.1, Plt <5.  -5/11: WBC 0.4, Hb 9.5, Plt 75 after transfusion of 2 units of PRBCs and 2 units of pheresed platelets.  -5/12: WBC 1.1, Hb 10.8, plt 75.  -Continue neutropenic precautions.  -Has been started on levaquin for prophylaxis  -Receivied G-CSF.   New Onset A Fib with RVR  -Has converted back to NSR ; amio and B blocker - Cardiology recommended amio and B blocker.  Elevated Troponin  -Denies CP/SOB.  -ECHO with EF 20-25%. NEW.  -Repeat EKG today.  -Card recommending medical management.  [place] Carcinoma on Lung  -Continue OP management with Oncology. (Dr. Julien Nordmann).   Hyperkalemia - given calcium gluconate, d 50 and 10 units of novolog insulin - will add kayexalate - continue IVF's  Hyponatremia  - Resolved with IVF's  Severe Protein-Caloric Malnutrition  -Nutrition consult.  -Continue to encourage PO intake. - Given continued poor oral intake. We'll plan on proceeding with G-tube insertion per family and patient's wishes. Consulted IR for placement - Thrombocytopenia problem and as such patient will require transfusion. After 1 unit of platelets patient's platelet count rose to 69. - Plan is for patient to receive platelets while she is getting peg tube placed by IR   Code Status: DNR Family Communication: discussed with daughter at bedside.  Disposition Plan: With continued improvement and after PEG placement   Consultants:  Oncology  Interventional radiology  Procedures:  None  Antibiotics:  Levaquin  HPI/Subjective: Pt has no new complaints currently. Pt more alert today.  Objective: Filed Vitals:   09/05/13 1424  BP: 104/68  Pulse: 94  Temp: 97.1 F (36.2 C)  Resp: 22    Intake/Output Summary (Last 24 hours) at 09/05/13 1457 Last data  filed at 09/05/13 0700  Gross per 24 hour  Intake   1795 ml  Output   1050 ml  Net    745 ml   Filed Weights   09/03/13 1917 09/04/13 0509 09/05/13 0500  Weight: 51.8 kg (114 lb 3.2 oz) 49.5 kg (109 lb 2 oz) 50.2 kg (110 lb 10.7 oz)    Exam:   General:  Pt in NAD, alert and awake  Cardiovascular: RRR, no MRG  Respiratory: CTA BL, no wheezes  Abdomen: soft, NT, ND  Musculoskeletal: no cyanosis or clubbing   Data Reviewed: Basic Metabolic Panel:  Recent Labs Lab 09/01/13 0103 09/01/13 0414 09/03/13 0353 09/03/13 0757 09/04/13 0355  NA 131* 132* 137 140 138  K 4.1 3.6* 6.4* 5.9* 4.5  CL 97 94* 108 110 106  CO2 15* 18* 17* 19 18*  GLUCOSE 159* 206* 96 93 102*  BUN 11 12 20 21 23   CREATININE 0.54 0.60 0.80 0.88 0.91  CALCIUM 7.7* 7.6* 8.2* 8.5 8.5  MG 0.8*  --   --   --   --    Liver Function Tests:  Recent Labs Lab 08/24/2013 1252 09/01/13 0103  AST 22 40*  ALT 24 27  ALKPHOS 348* 350*  BILITOT 1.0 2.6*  PROT 5.5* 5.5*  ALBUMIN 2.0* 2.2*    Recent Labs Lab 08/31/2013 1252  LIPASE 22   No results found for this basename: AMMONIA,  in the last 168 hours CBC:  Recent Labs Lab 09/01/13 0414 09/02/13 0424 09/03/13 0353 09/04/13 0355 09/05/13 0356  WBC 0.4* 1.1* 1.9* 4.7 9.8  NEUTROABS 0.2* 0.6* 1.1*  3.8 8.4*  HGB 9.5* 10.8* 9.9* 9.6* 9.0*  HCT 26.3* 30.5* 28.9* 28.5* 26.2*  MCV 84.0 85.0 87.0 88.8 87.6  PLT 75* 75* 44* 37* 69*   Cardiac Enzymes:  Recent Labs Lab 09/01/13 0103 09/01/13 0745 09/01/13 1411  TROPONINI 0.97* 0.58* 0.62*   BNP (last 3 results)  Recent Labs  09/03/13 0404  PROBNP 50074.0*   CBG: No results found for this basename: GLUCAP,  in the last 168 hours  Recent Results (from the past 240 hour(s))  MRSA PCR SCREENING     Status: None   Collection Time    09/01/13  2:09 AM      Result Value Ref Range Status   MRSA by PCR NEGATIVE  NEGATIVE Final   Comment:            The GeneXpert MRSA Assay (FDA      approved for NASAL specimens     only), is one component of a     comprehensive MRSA colonization     surveillance program. It is not     intended to diagnose MRSA     infection nor to guide or     monitor treatment for     MRSA infections.     Studies: No results found.  Scheduled Meds: . allopurinol  100 mg Oral BID  . amiodarone  400 mg Oral Daily  . antiseptic oral rinse  15 mL Mouth Rinse q12n4p  . budesonide-formoterol  2 puff Inhalation BID  . carvedilol  3.125 mg Oral BID WC  . dronabinol  2.5 mg Oral BID AC  . fentaNYL      . fluconazole  200 mg Oral Daily  . furosemide  40 mg Intravenous Daily  . glucagon      . lactose free nutrition  237 mL Oral TID BM  . levofloxacin  500 mg Oral Daily  . midazolam      . multivitamin with minerals  1 tablet Oral Daily  . pantoprazole  40 mg Oral BID  . polyethylene glycol  17 g Oral Daily  . protein supplement  1 scoop Oral TID WC  . sucralfate  1 g Oral TID WC & HS   Continuous Infusions: . sodium chloride 50 mL/hr at 09/05/13 1027    Time spent: > 55 minutes    [facility] Pager [phone] If 7PM-7AM, please contact night-coverage at [email or web address], password [facility] 09/05/2013, 2:57 PM  LOS: 6 days    Addendum: Patient has required increase oxygen to keep her oxygen saturations up.  She has an EF of 20-25% with diffuse hypokinesis.  Most likely SOB due to patient being fluid overloaded. Will give extra dose of lasix and check BNP.

## 2013-09-05 NOTE — Procedures (Signed)
Patient was scheduled for gastrostomy tube placement.  Patient's oxygen sats were in low 80s with 4 liters of nasal cannula while supine.  Patient was not a candidate for moderate sedation.  Discussed with daughter and Dr. Wendee Beavers.  Placed a nasogastric Kangaroo feeding tube.  Tip in distal stomach and ready to use.

## 2013-09-06 LAB — DIFFERENTIAL
BAND NEUTROPHILS: 0 % (ref 0–10)
BASOS ABS: 0 10*3/uL (ref 0.0–0.1)
BASOS PCT: 0 % (ref 0–1)
Blasts: 0 %
EOS ABS: 0 10*3/uL (ref 0.0–0.7)
Eosinophils Relative: 0 % (ref 0–5)
LYMPHS ABS: 3.5 10*3/uL (ref 0.7–4.0)
LYMPHS PCT: 23 % (ref 12–46)
Metamyelocytes Relative: 0 %
Monocytes Absolute: 0.9 10*3/uL (ref 0.1–1.0)
Monocytes Relative: 6 % (ref 3–12)
Myelocytes: 0 %
Neutro Abs: 11 10*3/uL — ABNORMAL HIGH (ref 1.7–7.7)
Neutrophils Relative %: 71 % (ref 43–77)
Promyelocytes Absolute: 0 %
nRBC: 0 /100 WBC

## 2013-09-06 LAB — PRO B NATRIURETIC PEPTIDE

## 2013-09-06 LAB — CBC
HEMATOCRIT: 28.1 % — AB (ref 36.0–46.0)
Hemoglobin: 9.8 g/dL — ABNORMAL LOW (ref 12.0–15.0)
MCH: 30.2 pg (ref 26.0–34.0)
MCHC: 34.9 g/dL (ref 30.0–36.0)
MCV: 86.7 fL (ref 78.0–100.0)
Platelets: 60 10*3/uL — ABNORMAL LOW (ref 150–400)
RBC: 3.24 MIL/uL — ABNORMAL LOW (ref 3.87–5.11)
RDW: 17.3 % — AB (ref 11.5–15.5)
WBC: 15.4 10*3/uL — ABNORMAL HIGH (ref 4.0–10.5)

## 2013-09-06 LAB — BASIC METABOLIC PANEL
BUN: 25 mg/dL — AB (ref 6–23)
CALCIUM: 8.3 mg/dL — AB (ref 8.4–10.5)
CO2: 21 mEq/L (ref 19–32)
CREATININE: 0.95 mg/dL (ref 0.50–1.10)
Chloride: 102 mEq/L (ref 96–112)
GFR calc Af Amer: 67 mL/min — ABNORMAL LOW (ref >90)
GFR calc non Af Amer: 58 mL/min — ABNORMAL LOW (ref >90)
Glucose, Bld: 92 mg/dL (ref 70–99)
Potassium: 2.2 mEq/L — CL (ref 3.7–5.3)
Sodium: 138 mEq/L (ref 137–147)

## 2013-09-06 LAB — GLUCOSE, CAPILLARY: GLUCOSE-CAPILLARY: 136 mg/dL — AB (ref 70–99)

## 2013-09-06 LAB — MAGNESIUM: Magnesium: 1.1 mg/dL — ABNORMAL LOW (ref 1.5–2.5)

## 2013-09-06 LAB — POTASSIUM: POTASSIUM: 2.8 meq/L — AB (ref 3.7–5.3)

## 2013-09-06 LAB — PHOSPHORUS: PHOSPHORUS: 2.6 mg/dL (ref 2.3–4.6)

## 2013-09-06 MED ORDER — JEVITY 1.2 CAL PO LIQD
1000.0000 mL | ORAL | Status: DC
  Administered 2013-09-06 – 2013-09-09 (×4): 1000 mL
  Filled 2013-09-06 (×5): qty 1000

## 2013-09-06 MED ORDER — FUROSEMIDE 10 MG/ML IJ SOLN
40.0000 mg | Freq: Once | INTRAMUSCULAR | Status: AC
  Administered 2013-09-06: 40 mg via INTRAVENOUS
  Filled 2013-09-06: qty 4

## 2013-09-06 MED ORDER — POTASSIUM CHLORIDE 10 MEQ/100ML IV SOLN
10.0000 meq | INTRAVENOUS | Status: AC
  Administered 2013-09-06 (×2): 10 meq via INTRAVENOUS
  Filled 2013-09-06 (×2): qty 100

## 2013-09-06 MED ORDER — MAGNESIUM SULFATE 40 MG/ML IJ SOLN
2.0000 g | Freq: Once | INTRAMUSCULAR | Status: AC
  Administered 2013-09-06: 2 g via INTRAVENOUS
  Filled 2013-09-06: qty 50

## 2013-09-06 MED ORDER — POTASSIUM CHLORIDE 20 MEQ/15ML (10%) PO LIQD
40.0000 meq | Freq: Once | ORAL | Status: AC
  Administered 2013-09-06: 40 meq
  Filled 2013-09-06: qty 30

## 2013-09-06 MED ORDER — FUROSEMIDE 10 MG/ML IJ SOLN
80.0000 mg | Freq: Two times a day (BID) | INTRAMUSCULAR | Status: DC
  Administered 2013-09-06 – 2013-09-07 (×2): 80 mg via INTRAVENOUS
  Filled 2013-09-06 (×5): qty 8

## 2013-09-06 MED ORDER — POTASSIUM CHLORIDE CRYS ER 20 MEQ PO TBCR
40.0000 meq | EXTENDED_RELEASE_TABLET | Freq: Once | ORAL | Status: DC
  Filled 2013-09-06: qty 2

## 2013-09-06 MED ORDER — MAGNESIUM SULFATE 40 MG/ML IJ SOLN
2.0000 g | Freq: Once | INTRAMUSCULAR | Status: DC
  Filled 2013-09-06: qty 50

## 2013-09-06 MED ORDER — POTASSIUM CHLORIDE 10 MEQ/100ML IV SOLN
10.0000 meq | INTRAVENOUS | Status: AC
  Administered 2013-09-06 (×4): 10 meq via INTRAVENOUS
  Filled 2013-09-06 (×4): qty 100

## 2013-09-06 NOTE — Progress Notes (Signed)
CRITICAL VALUE ALERT  Critical value received:  K 2.8  Date of notification: 09/06/2013  Time of notification:  5631  Critical value read back: yes  Nurse who received alert:  Renato Gails  MD notified (1st page):  Wendee Beavers Time of first page:  1802  MD notified (2nd page):no 2nd page  Time of second page: no 2nd page  Responding MD:  Wendee Beavers  Time MD responded:  [phone]

## 2013-09-06 NOTE — Progress Notes (Signed)
NUTRITION FOLLOW-UP/CONSULT  DOCUMENTATION CODES Per approved criteria  -Severe malnutrition in the context of chronic illness   Pt meets criteria for severe MALNUTRITION in the context of chronic illness as evidenced by <75% estimated energy intake in the past month with 14% weight loss in the past 2 months with severe muscle wasting and subcutaneous fat loss.  INTERVENTION:  Monitor magnesium, potassium, and phosphorus daily for at least 3 days, MD to replete as needed, as pt is at risk for refeeding syndrome given severe malnutrition. Text paged MD.  Initiate feedings of Jevity 1.2 at 20 ml/hr via NGT. Do not advance until refeeding electrolytes (potassium, magnesium and phosphorus) are repleted. Once ready to advance, recommend advancement of Jevity 1.2 by 10 ml q 12 hours until goal of 50 ml/hr. Goal regimen will provide: 1440 kcal, 67 grams protein, 968 ml free water.  Recommend regular, liberalized diet to encourage PO intake  Continue to encouraged Boost Plus TID + 1 scoop Beneprotein TID orally  Continue multivitamin 1 tablet PO daily --> may discontinue once pt is at goal of Jevity 1.2 at 50 ml/hr  Will continue to monitor   NUTRITION DIAGNOSIS: Inadequate oral intake related to poor appetite as evidenced by pt report. Ongoing.  Goal: Pt to consume >90% of meals/supplements  Monitor:  Weights, labs, intake, PO intake, supplement tolerance, TF tolerance  ASSESSMENT: Pt with non-small cell lung cancer with metastasis to the brain and throughout the lungs. She has had a craniotomy and started chemo last week. She has had a poor appetite and has had to be given IVF multiple times over the past week. She comes in now for severe weakness and is found to be hypokalemic and pancytopenic. She will be admitted for K replacement, blood transfusion and IVF.   Family desires G-tube. Per chart, pt was scheduled for g-tube placement 5/15, however pt with low O2 sats and not a candidate  for moderate sedation, NGT placed with tip in distal stomach. RD consulted to initiate feedings.  RD spoke with RN via telephone. Pt is eating barely anything. Ordered for Heart Healthy diet, Boost Plus TID, and 1 scoop Beneprotein TID. RN reports that she was able to get 1 scoop of Beneprotein into the patient this morning. Currently receiving marinol.  Current weight is +18 lb admit weight.  Potassium low at 2.2 --> ordered for KCl Magnesium low at 1.2 No phosphorus available  Height: Ht Readings from Last 1 Encounters:  08/29/2013 4\' 11"  (1.499 m)    Weight: Wt Readings from Last 1 Encounters:  09/06/13 110 lb 0.2 oz (49.9 kg)  Admit wt 92 lb  BMI:  Body mass index is 22.21 kg/(m^2). WNL  Estimated Nutritional Needs: Kcal: 1300-1500 Protein: 65-80 g Fluid: 1.3-1.5L/day  Skin:  stage II R ischium MASD to R buttock  Diet Order: Cardiac  EDUCATION NEEDS: -No education needs identified at this time   Intake/Output Summary (Last 24 hours) at 09/06/13 1315 Last data filed at 09/06/13 1059  Gross per 24 hour  Intake    820 ml  Output   3275 ml  Net  -2455 ml    Last BM: 5/10  Labs:   Recent Labs Lab 09/01/13 0103  09/03/13 0757 09/04/13 0355 09/06/13 0518  NA 131*  < > 140 138 138  K 4.1  < > 5.9* 4.5 2.2*  CL 97  < > 110 106 102  CO2 15*  < > 19 18* 21  BUN 11  < >  21 23 25*  CREATININE 0.54  < > 0.88 0.91 0.95  CALCIUM 7.7*  < > 8.5 8.5 8.3*  MG 0.8*  --   --   --  1.1*  GLUCOSE 159*  < > 93 102* 92  < > = values in this interval not displayed.  CBG (last 3)  No results found for this basename: GLUCAP,  in the last 72 hours  Scheduled Meds: . allopurinol  100 mg Oral BID  . amiodarone  400 mg Oral Daily  . antiseptic oral rinse  15 mL Mouth Rinse q12n4p  . budesonide-formoterol  2 puff Inhalation BID  . carvedilol  3.125 mg Oral BID WC  . dronabinol  2.5 mg Oral BID AC  . fluconazole  200 mg Oral Daily  . furosemide  80 mg Intravenous BID  .  lactose free nutrition  237 mL Oral TID BM  . multivitamin with minerals  1 tablet Oral Daily  . pantoprazole  40 mg Oral BID  . polyethylene glycol  17 g Oral Daily  . protein supplement  1 scoop Oral TID WC  . sucralfate  1 g Oral TID WC & HS    Continuous Infusions:    Inda Coke MS, RD, LDN Inpatient Registered Dietitian Pager: [phone] After-hours pager: [phone]

## 2013-09-06 NOTE — Progress Notes (Signed)
CRITICAL VALUE ALERT  Critical value received:  Potassium 2.2  Date of notification:  09/06/13  Time of notification:  0623  Critical value read back:yes  Nurse who received alert:  Virgina Norfolk  MD notified (1st page):  Rogue Bussing  Time of first page:  0628  MD notified (2nd page):  Time of second page:  Responding MD: Rogue Bussing  Time MD responded:  0630  New orders placed. Will continue to monitor.

## 2013-09-06 NOTE — Progress Notes (Signed)
[facility] PROGRESS NOTE  Rachael Wall WUJ:[id number] DOB: 04/23/1941 DOA: 08/27/2013 PCP: [name], MD  Assessment/Plan: Severe Pancytopenia, Chemotherapy-Induced  -5/10: WBC 0.2, Hb 6.1, Plt <5.  -5/11: WBC 0.4, Hb 9.5, Plt 75 after transfusion of 2 units of PRBCs and 2 units of pheresed platelets.  -5/12: WBC 1.1, Hb 10.8, plt 75.  -Continue neutropenic precautions.  -Has been started on levaquin for prophylaxis  -Receivied G-CSF.   Acute on chronic systolic heart failure - Elevated BNP of more than 70,000. Lasix 40 mg IV initiated yesterday evening. Patient was 10 L net positive, but with yesterday evening dose of Lasix loss more than 2.5 L.  Still requiring increased supplemental oxygen use. We'll administer 80 mg IV twice a day of Lasix today 09/06/2013. X-ray showing worsening pulmonary edema. - continue to assess daily weights and I and O's - Continue carvedilol in context of patient with recent new onset A. fib.  New Onset A Fib with RVR  -Has converted back to NSR ; amio and B blocker - Cardiology recommended amio and B blocker.  Elevated Troponin  -Denies CP/SOB.  -ECHO with EF 20-25%. NEW.  -Repeat EKG today.  -Card recommending medical management.  [place] Carcinoma on Lung  -Continue OP management with Oncology. (Dr. Julien Nordmann).   Hypokalemia - Most likely combination of Kayexalate and recent Lasix administration. Patient was given four runs of potassium.  - Will place order for oral potassium replacement  - Reassess potassium levels next a.m.  Hypomagnesemia - Most likely contributing to low potassium levels. We'll plan on replacing IV and reassess next a.m.  Hyponatremia  - Resolved with IVF's - Pt saline locked currently  Severe Protein-Caloric Malnutrition  -Nutrition consult.  -Continue to encourage PO intake. - Pt has NG tube. Will consult dietitian for tube feedings.   Code Status: DNR Family Communication: discussed with daughter at  bedside.  Disposition Plan: With continued improvement and after PEG placement   Consultants:  Oncology  Interventional radiology  Procedures:  None  Antibiotics:  Levaquin  HPI/Subjective: Pt has no new complaints currently. Family and patient wondering about when they will start nutrition via NG tube.  Objective: Filed Vitals:   09/06/13 0630  BP: 121/71  Pulse: 88  Temp: 97.5 F (36.4 C)  Resp: 20    Intake/Output Summary (Last 24 hours) at 09/06/13 1330 Last data filed at 09/06/13 1059  Gross per 24 hour  Intake    820 ml  Output   3275 ml  Net  -2455 ml   Filed Weights   09/04/13 0509 09/05/13 0500 09/06/13 0500  Weight: 49.5 kg (109 lb 2 oz) 50.2 kg (110 lb 10.7 oz) 49.9 kg (110 lb 0.2 oz)    Exam:   General:  Pt in NAD, alert and awake  Cardiovascular: RRR, no MRG  Respiratory: CTA BL, no wheezes  Abdomen: soft, NT, ND  Musculoskeletal: no cyanosis or clubbing   Data Reviewed: Basic Metabolic Panel:  Recent Labs Lab 09/01/13 0103 09/01/13 0414 09/03/13 0353 09/03/13 0757 09/04/13 0355 09/06/13 0518  NA 131* 132* 137 140 138 138  K 4.1 3.6* 6.4* 5.9* 4.5 2.2*  CL 97 94* 108 110 106 102  CO2 15* 18* 17* 19 18* 21  GLUCOSE 159* 206* 96 93 102* 92  BUN 11 12 20 21 23  25*  CREATININE 0.54 0.60 0.80 0.88 0.91 0.95  CALCIUM 7.7* 7.6* 8.2* 8.5 8.5 8.3*  MG 0.8*  --   --   --   --  1.1*   Liver Function Tests:  Recent Labs Lab 09/01/13 0103  AST 40*  ALT 27  ALKPHOS 350*  BILITOT 2.6*  PROT 5.5*  ALBUMIN 2.2*   No results found for this basename: LIPASE, AMYLASE,  in the last 168 hours No results found for this basename: AMMONIA,  in the last 168 hours CBC:  Recent Labs Lab 09/02/13 0424 09/03/13 0353 09/04/13 0355 09/05/13 0356 09/06/13 0518  WBC 1.1* 1.9* 4.7 9.8 15.4*  NEUTROABS 0.6* 1.1* 3.8 8.4* 11.0*  HGB 10.8* 9.9* 9.6* 9.0* 9.8*  HCT 30.5* 28.9* 28.5* 26.2* 28.1*  MCV 85.0 87.0 88.8 87.6 86.7  PLT 75* 44*  37* 69* 60*   Cardiac Enzymes:  Recent Labs Lab 09/01/13 0103 09/01/13 0745 09/01/13 1411  TROPONINI 0.97* 0.58* 0.62*   BNP (last 3 results)  Recent Labs  09/03/13 0404 09/06/13 0518  PROBNP 50074.0* >70000.0*   CBG: No results found for this basename: GLUCAP,  in the last 168 hours  Recent Results (from the past 240 hour(s))  MRSA PCR SCREENING     Status: None   Collection Time    09/01/13  2:09 AM      Result Value Ref Range Status   MRSA by PCR NEGATIVE  NEGATIVE Final   Comment:            The GeneXpert MRSA Assay (FDA     approved for NASAL specimens     only), is one component of a     comprehensive MRSA colonization     surveillance program. It is not     intended to diagnose MRSA     infection nor to guide or     monitor treatment for     MRSA infections.     Studies: [facility] Non[employer]c W/fl W/rad  09/05/2013   CLINICAL DATA:  73 year old with metastatic lung cancer and malnutrition. Patient was initially scheduled for a gastrostomy tube. The patient's oxygen saturations were dropping when the patient was placed supine and the patient was not a candidate for moderate sedation. These findings were discussed with the patient's daughter and the primary physician. We decided to place a nasogastric feeding tube at this time.  EXAM: PLACEMENT OF A FEEDING TUBE WITH FLUOROSCOPY.  Physician: Stephan Minister. Henn, MD  FLUOROSCOPY TIME:  5 min and 6 seconds  MEDICATIONS: Viscous lidocaine  ANESTHESIA/SEDATION: None  PROCEDURE: The right nostril was anesthetized with viscous lidocaine. A Kangaroo feeding tube was placed through the right nostril and directed into the stomach with fluoroscopy. The tip was advanced to the distal stomach and duodenum bulb but could not be further advanced. Catheter was secured to the nose.  FINDINGS: The feeding tube tip is in the distal stomach and near the duodenal bulb.  COMPLICATIONS: None  IMPRESSION: Placement of a feeding tube with fluoroscopy.  Catheter tip is near the duodenum bulb and ready to be used.   Electronically Signed   By: Markus Daft M.D.   On: 09/05/2013 17:01   Dg Chest Port 1 View  09/05/2013   CLINICAL DATA:  Increased shortness of breath.  EXAM: PORTABLE CHEST - 1 VIEW  COMPARISON:  09/01/2013  FINDINGS: A feeding tube has been advanced into the abdomen. There are increased densities at both lung bases suggestive for dependent edema and pleural effusions. Increased interstitial densities, particularly in the right upper lung. Heart size is grossly stable. Negative for a pneumothorax.  IMPRESSION: Increased interstitial densities, particularly in the right  upper lung. Findings may represent asymmetric pulmonary edema. Recommend follow-up to exclude developing airspace disease or pneumonia in the right upper lung.  Increased basilar densities are suggestive for atelectasis and pleural effusions.  Interval placement of a feeding tube.   Electronically Signed   By: Markus Daft M.D.   On: 09/05/2013 16:56    Scheduled Meds: . allopurinol  100 mg Oral BID  . amiodarone  400 mg Oral Daily  . antiseptic oral rinse  15 mL Mouth Rinse q12n4p  . budesonide-formoterol  2 puff Inhalation BID  . carvedilol  3.125 mg Oral BID WC  . dronabinol  2.5 mg Oral BID AC  . fluconazole  200 mg Oral Daily  . furosemide  80 mg Intravenous BID  . lactose free nutrition  237 mL Oral TID BM  . multivitamin with minerals  1 tablet Oral Daily  . pantoprazole  40 mg Oral BID  . polyethylene glycol  17 g Oral Daily  . protein supplement  1 scoop Oral TID WC  . sucralfate  1 g Oral TID WC & HS   Continuous Infusions:    Time spent: > 55 minutes    [facility] Pager [phone] If 7PM-7AM, please contact night-coverage at [email or web address], password [facility] 09/06/2013, 1:30 PM  LOS: 7 days    Addendum: Patient has required increase oxygen to keep her oxygen saturations up.  She has an EF of 20-25% with diffuse hypokinesis.  Most  likely SOB due to patient being fluid overloaded. Will give extra dose of lasix and check BNP.

## 2013-09-07 LAB — BASIC METABOLIC PANEL
BUN: 25 mg/dL — ABNORMAL HIGH (ref 6–23)
CHLORIDE: 98 meq/L (ref 96–112)
CO2: 29 meq/L (ref 19–32)
CREATININE: 0.93 mg/dL (ref 0.50–1.10)
Calcium: 8.5 mg/dL (ref 8.4–10.5)
GFR calc Af Amer: 68 mL/min — ABNORMAL LOW (ref >90)
GFR calc non Af Amer: 59 mL/min — ABNORMAL LOW (ref >90)
Glucose, Bld: 171 mg/dL — ABNORMAL HIGH (ref 70–99)
POTASSIUM: 2.7 meq/L — AB (ref 3.7–5.3)
SODIUM: 140 meq/L (ref 137–147)

## 2013-09-07 LAB — GLUCOSE, CAPILLARY
GLUCOSE-CAPILLARY: 130 mg/dL — AB (ref 70–99)
GLUCOSE-CAPILLARY: 133 mg/dL — AB (ref 70–99)
GLUCOSE-CAPILLARY: 139 mg/dL — AB (ref 70–99)
GLUCOSE-CAPILLARY: 186 mg/dL — AB (ref 70–99)
Glucose-Capillary: 146 mg/dL — ABNORMAL HIGH (ref 70–99)
Glucose-Capillary: 155 mg/dL — ABNORMAL HIGH (ref 70–99)
Glucose-Capillary: 157 mg/dL — ABNORMAL HIGH (ref 70–99)

## 2013-09-07 LAB — CBC
HCT: 30.3 % — ABNORMAL LOW (ref 36.0–46.0)
Hemoglobin: 10.8 g/dL — ABNORMAL LOW (ref 12.0–15.0)
MCH: 30.3 pg (ref 26.0–34.0)
MCHC: 35.6 g/dL (ref 30.0–36.0)
MCV: 84.9 fL (ref 78.0–100.0)
PLATELETS: 62 10*3/uL — AB (ref 150–400)
RBC: 3.57 MIL/uL — AB (ref 3.87–5.11)
RDW: 17.2 % — ABNORMAL HIGH (ref 11.5–15.5)
WBC: 23 10*3/uL — AB (ref 4.0–10.5)

## 2013-09-07 LAB — DIFFERENTIAL
BAND NEUTROPHILS: 0 % (ref 0–10)
Basophils Absolute: 0 10*3/uL (ref 0.0–0.1)
Basophils Relative: 0 % (ref 0–1)
Blasts: 0 %
Eosinophils Absolute: 0 10*3/uL (ref 0.0–0.7)
Eosinophils Relative: 0 % (ref 0–5)
Lymphocytes Relative: 29 % (ref 12–46)
Lymphs Abs: 6.7 10*3/uL — ABNORMAL HIGH (ref 0.7–4.0)
MONOS PCT: 9 % (ref 3–12)
MYELOCYTES: 0 %
Metamyelocytes Relative: 0 %
Monocytes Absolute: 2.1 10*3/uL — ABNORMAL HIGH (ref 0.1–1.0)
NRBC: 0 /100{WBCs}
Neutro Abs: 14.2 10*3/uL — ABNORMAL HIGH (ref 1.7–7.7)
Neutrophils Relative %: 62 % (ref 43–77)
PROMYELOCYTES ABS: 0 %

## 2013-09-07 LAB — MAGNESIUM: MAGNESIUM: 1.5 mg/dL (ref 1.5–2.5)

## 2013-09-07 MED ORDER — VANCOMYCIN HCL IN DEXTROSE 750-5 MG/150ML-% IV SOLN
750.0000 mg | INTRAVENOUS | Status: DC
  Administered 2013-09-07 – 2013-09-09 (×3): 750 mg via INTRAVENOUS
  Filled 2013-09-07 (×4): qty 150

## 2013-09-07 MED ORDER — POTASSIUM CHLORIDE 20 MEQ/15ML (10%) PO LIQD
40.0000 meq | Freq: Two times a day (BID) | ORAL | Status: DC
  Administered 2013-09-07 – 2013-09-08 (×3): 40 meq
  Filled 2013-09-07 (×4): qty 30

## 2013-09-07 MED ORDER — FUROSEMIDE 10 MG/ML IJ SOLN
80.0000 mg | Freq: Two times a day (BID) | INTRAMUSCULAR | Status: DC
  Administered 2013-09-07 – 2013-09-08 (×2): 80 mg via INTRAVENOUS
  Filled 2013-09-07 (×4): qty 8

## 2013-09-07 MED ORDER — POTASSIUM CHLORIDE 10 MEQ/100ML IV SOLN
10.0000 meq | INTRAVENOUS | Status: AC
  Administered 2013-09-07 (×3): 10 meq via INTRAVENOUS
  Filled 2013-09-07 (×3): qty 100

## 2013-09-07 MED ORDER — MAGNESIUM OXIDE 400 (241.3 MG) MG PO TABS
400.0000 mg | ORAL_TABLET | Freq: Two times a day (BID) | ORAL | Status: DC
  Administered 2013-09-07 – 2013-09-09 (×5): 400 mg
  Filled 2013-09-07 (×12): qty 1

## 2013-09-07 MED ORDER — DEXTROSE 5 % IV SOLN
1.0000 g | Freq: Two times a day (BID) | INTRAVENOUS | Status: DC
  Administered 2013-09-07 – 2013-09-10 (×7): 1 g via INTRAVENOUS
  Filled 2013-09-07 (×8): qty 1

## 2013-09-07 NOTE — Progress Notes (Signed)
CRITICAL VALUE ALERT  Critical value received:  Potassium 2.7  Date of notification:  5/17  Time of notification:  0542  Critical value read back:yes  Nurse who received alert:  Virgina Norfolk  MD notified (1st page):  Rogue Bussing  Time of first page:  0545  MD notified (2nd page):  Time of second page:  Responding MD:  Rogue Bussing  Time MD responded:  [phone]

## 2013-09-07 NOTE — Progress Notes (Signed)
[facility] PROGRESS NOTE  Rachael Wall FAO:[id number] DOB: 18-Apr-1941 DOA: 09/13/2013 PCP: [name], MD  Assessment/Plan: Severe Pancytopenia, Chemotherapy-Induced  -5/10: WBC 0.2, Hb 6.1, Plt <5.  -5/11: WBC 0.4, Hb 9.5, Plt 75 after transfusion of 2 units of PRBCs and 2 units of pheresed platelets.  -5/12: WBC 1.1, Hb 10.8, plt 75.  - Patient received Neulasta after recent chemotherapy. I suspect that current rise in WBC from 0.3 to 23 is related to this.  Will continue to monitor closely.  Acute on chronic systolic heart failure - Elevated BNP of more than 70,000. Patient subsequently started on Lasix.  - X-ray showing worsening pulmonary edema. - Continue Lasix 80 mg IV BID - Continue to assess daily weights and I and O's,  - Continue carvedilol in context of patient with recent new onset A. Fib.  SOB - Most likely related to recent fluid overload. And WBC elevation most likely due to neulasta.  Nonetheless will cover her with antibiotics at this juncture given her prolonged hospital stay. Patient is not coughing up phlem and has been afebrile so my index of suspicion for infection is low. As such threshold to d/c antibiotics will be low with clinical improvement.  New Onset A Fib with RVR  - Has converted back to NSR ; amio and B blocker - Cardiology recommended amio and B blocker.  Elevated Troponin  -Denies CP/SOB.  -ECHO with EF 20-25%. NEW.  -Repeat EKG today.  -Card recommending medical management.  [place] Carcinoma on Lung  -Continue OP management with Oncology. (Dr. Julien Nordmann).   Hypokalemia - Patient this AM given IV K runs - Will place order for oral potassium replacement to be administered daily. - Reassess potassium levels next a.m. - Will continue to monitor patient on Tele   Hypomagnesemia - resolved after IV replacement - Will place order for po magnesium replacement.  Hyponatremia  - Resolved with IVF's - Pt saline locked  currently  Severe Protein-Caloric Malnutrition   -Nutrition consult.  - Continue to encourage PO intake. - Pt has NG tube. Will consult dietitian for tube feedings. - Multivitamin on board.   Code Status: DNR Family Communication: discussed with daughter at bedside.  Disposition Plan: With continued improvement and after PEG placement   Consultants:  Oncology  Interventional radiology  Procedures:  None  Antibiotics:  Levaquin  HPI/Subjective: Pt has no new complaints currently. States that her SOB is improved from yesterday.  Objective: Filed Vitals:   09/07/13 0600  BP: 123/80  Pulse: 89  Temp: 97.6 F (36.4 C)  Resp: 28    Intake/Output Summary (Last 24 hours) at 09/07/13 0933 Last data filed at 09/07/13 0654  Gross per 24 hour  Intake    948 ml  Output   4365 ml  Net  -3417 ml   Filed Weights   09/05/13 0500 09/06/13 0500 09/07/13 0600  Weight: 50.2 kg (110 lb 10.7 oz) 49.9 kg (110 lb 0.2 oz) 50.122 kg (110 lb 8 oz)    Exam:   General:  Pt in NAD, alert and awake  Cardiovascular: RRR, no MRG  Respiratory: no wheezes, decreased breath sounds at bases with mild rhales.  Abdomen: soft, NT, ND  Musculoskeletal: no cyanosis or clubbing   Data Reviewed: Basic Metabolic Panel:  Recent Labs Lab 09/01/13 0103  09/03/13 0353 09/03/13 0757 09/04/13 0355 09/06/13 0518 09/06/13 1650 09/07/13 0445  NA 131*  < > 137 140 138 138  --  140  K 4.1  < >  6.4* 5.9* 4.5 2.2* 2.8* 2.7*  CL 97  < > 108 110 106 102  --  98  CO2 15*  < > 17* 19 18* 21  --  29  GLUCOSE 159*  < > 96 93 102* 92  --  171*  BUN 11  < > 20 21 23  25*  --  25*  CREATININE 0.54  < > 0.80 0.88 0.91 0.95  --  0.93  CALCIUM 7.7*  < > 8.2* 8.5 8.5 8.3*  --  8.5  MG 0.8*  --   --   --   --  1.1*  --  1.5  PHOS  --   --   --   --   --  2.6  --   --   < > = values in this interval not displayed. Liver Function Tests:  Recent Labs Lab 09/01/13 0103  AST 40*  ALT 27  ALKPHOS  350*  BILITOT 2.6*  PROT 5.5*  ALBUMIN 2.2*   No results found for this basename: LIPASE, AMYLASE,  in the last 168 hours No results found for this basename: AMMONIA,  in the last 168 hours CBC:  Recent Labs Lab 09/03/13 0353 09/04/13 0355 09/05/13 0356 09/06/13 0518 09/07/13 0445  WBC 1.9* 4.7 9.8 15.4* 23.0*  NEUTROABS 1.1* 3.8 8.4* 11.0* 14.2*  HGB 9.9* 9.6* 9.0* 9.8* 10.8*  HCT 28.9* 28.5* 26.2* 28.1* 30.3*  MCV 87.0 88.8 87.6 86.7 84.9  PLT 44* 37* 69* 60* 62*   Cardiac Enzymes:  Recent Labs Lab 09/01/13 0103 09/01/13 0745 09/01/13 1411  TROPONINI 0.97* 0.58* 0.62*   BNP (last 3 results)  Recent Labs  09/03/13 0404 09/06/13 0518  PROBNP 50074.0* >70000.0*   CBG:  Recent Labs Lab 09/06/13 2009 09/07/13 0011 09/07/13 0559 09/07/13 0805  GLUCAP 136* 155* 157* 146*    Recent Results (from the past 240 hour(s))  MRSA PCR SCREENING     Status: None   Collection Time    09/01/13  2:09 AM      Result Value Ref Range Status   MRSA by PCR NEGATIVE  NEGATIVE Final   Comment:            The GeneXpert MRSA Assay (FDA     approved for NASAL specimens     only), is one component of a     comprehensive MRSA colonization     surveillance program. It is not     intended to diagnose MRSA     infection nor to guide or     monitor treatment for     MRSA infections.     Studies: Ir Noni Saupe Plc W/fl W/rad  09/05/2013   CLINICAL DATA:  73 year old with metastatic lung cancer and malnutrition. Patient was initially scheduled for a gastrostomy tube. The patient's oxygen saturations were dropping when the patient was placed supine and the patient was not a candidate for moderate sedation. These findings were discussed with the patient's daughter and the primary physician. We decided to place a nasogastric feeding tube at this time.  EXAM: PLACEMENT OF A FEEDING TUBE WITH FLUOROSCOPY.  Physician: Stephan Minister. Henn, MD  FLUOROSCOPY TIME:  5 min and 6 seconds   MEDICATIONS: Viscous lidocaine  ANESTHESIA/SEDATION: None  PROCEDURE: The right nostril was anesthetized with viscous lidocaine. A Kangaroo feeding tube was placed through the right nostril and directed into the stomach with fluoroscopy. The tip was advanced to the distal stomach and duodenum bulb but could  not be further advanced. Catheter was secured to the nose.  FINDINGS: The feeding tube tip is in the distal stomach and near the duodenal bulb.  COMPLICATIONS: None  IMPRESSION: Placement of a feeding tube with fluoroscopy. Catheter tip is near the duodenum bulb and ready to be used.   Electronically Signed   By: Markus Daft M.D.   On: 09/05/2013 17:01   Dg Chest Port 1 View  09/05/2013   CLINICAL DATA:  Increased shortness of breath.  EXAM: PORTABLE CHEST - 1 VIEW  COMPARISON:  09/01/2013  FINDINGS: A feeding tube has been advanced into the abdomen. There are increased densities at both lung bases suggestive for dependent edema and pleural effusions. Increased interstitial densities, particularly in the right upper lung. Heart size is grossly stable. Negative for a pneumothorax.  IMPRESSION: Increased interstitial densities, particularly in the right upper lung. Findings may represent asymmetric pulmonary edema. Recommend follow-up to exclude developing airspace disease or pneumonia in the right upper lung.  Increased basilar densities are suggestive for atelectasis and pleural effusions.  Interval placement of a feeding tube.   Electronically Signed   By: Markus Daft M.D.   On: 09/05/2013 16:56    Scheduled Meds: . allopurinol  100 mg Oral BID  . amiodarone  400 mg Oral Daily  . antiseptic oral rinse  15 mL Mouth Rinse q12n4p  . budesonide-formoterol  2 puff Inhalation BID  . carvedilol  3.125 mg Oral BID WC  . ceFEPime (MAXIPIME) IV  1 g Intravenous Q12H  . dronabinol  2.5 mg Oral BID AC  . feeding supplement (JEVITY 1.2 CAL)  1,000 mL Per Tube Q24H  . fluconazole  200 mg Oral Daily  . furosemide   80 mg Intravenous BID  . lactose free nutrition  237 mL Oral TID BM  . magnesium oxide  400 mg Per Tube BID  . multivitamin with minerals  1 tablet Oral Daily  . pantoprazole  40 mg Oral BID  . polyethylene glycol  17 g Oral Daily  . potassium chloride  10 mEq Intravenous Q1 Hr x 3  . potassium chloride  40 mEq Per Tube BID  . protein supplement  1 scoop Oral TID WC  . sucralfate  1 g Oral TID WC & HS  . vancomycin  750 mg Intravenous Q24H   Continuous Infusions:    Time spent: > 45 minutes    [facility] Pager [phone] If 7PM-7AM, please contact night-coverage at [email or web address], password [facility] 09/07/2013, 9:33 AM  LOS: 8 days

## 2013-09-07 NOTE — Progress Notes (Signed)
NUTRITION FOLLOW UP  Intervention:   - Monitor magnesium, potassium, and phosphorus daily for at least 3 days, MD to replete as needed, as pt is at risk for refeeding syndrome given severe malnutrition. - Continue Jevity 1.2 at 20 ml/hr via NGT. Do not advance at this time due to continued low potassium.  - When ready to advance, increase Jevity 1.2 by 10 ml every 12 hours to goal rate of 50 ml/hr, provides 1440 kcal, 67 g protein, 968 ml free water.  - Recommend Regular, liberalized diet to encourage PO intake.  - Continue Boost Plus TID and 1 scoop Beneprotein TID orally.  - Continue multivitamin 1 tablet PO daily until TF at goal rate.   NUTRITION DIAGNOSIS:  Inadequate oral intake related to poor appetite as evidenced by pt report. Ongoing.   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 She has had a craniotomy and started chemo last week. She has had a poor appetite and has had to be given IVF multiple times over the past week. She comes in now for severe weakness and is found to be hypokalemic and pancytopenic. She will be admitted for K replacement, blood transfusion and IVF.   Patient is currently receiving Jevity 1.2 at 20 ml/hr with orders to not advance until refeeding labs are repleted. Patient is tolerating this well. She continues to receive meals, Boost Plus TID, and Beneprotein TID. However, intake of this is minimal.   Patient is receiving KCl and Magnesium supplementation. Potassium is improved, but still low. Per RN, patient continues to receive Lasix, which is likely contributing to depletion of potassium.   Potassium low at 2.7 Magnesium WNL at 1.5 Phosphorus WNL  Plan to continue Jevity 1.2 at 20 ml/hr until potassium repleted. Will continue to monitor to determine if slow advance  will be tolerated.   Height: Ht Readings from Last 1 Encounters:  09/18/2013 4\' 11"  (1.499 m)    Weight Status:   Wt Readings from Last 1 Encounters:  09/07/13 110 lb 8 oz (50.122 kg)    Re-estimated needs:  Kcal: 1300-1500  Protein: 65-80 g  Fluid: 1.3-1.5L/day  Skin: stage II R ischium  MASD to R buttock  Diet Order: Cardiac   Intake/Output Summary (Last 24 hours) at 09/07/13 1324 Last data filed at 09/07/13 1300  Gross per 24 hour  Intake    848 ml  Output   5616 ml  Net  -4768 ml    Last BM: 5/16   Labs:   Recent Labs Lab 09/01/13 0103  09/04/13 0355 09/06/13 0518 09/06/13 1650 09/07/13 0445  NA 131*  < > 138 138  --  140  K 4.1  < > 4.5 2.2* 2.8* 2.7*  CL 97  < > 106 102  --  98  CO2 15*  < > 18* 21  --  29  BUN 11  < > 23 25*  --  25*  CREATININE 0.54  < > 0.91 0.95  --  0.93  CALCIUM 7.7*  < > 8.5 8.3*  --  8.5  MG 0.8*  --   --  1.1*  --  1.5  PHOS  --   --   --  2.6  --   --   GLUCOSE 159*  < > 102* 92  --  171*  < > = values in this interval not displayed.  CBG (last 3)   Recent Labs  09/07/13 0559 09/07/13 0805 09/07/13 1218  GLUCAP 157* 146* 186*    Scheduled Meds: . allopurinol  100 mg Oral BID  . amiodarone  400 mg Oral Daily  . antiseptic oral rinse  15 mL Mouth Rinse q12n4p  . budesonide-formoterol  2 puff Inhalation BID  . carvedilol  3.125 mg Oral BID WC  . ceFEPime (MAXIPIME) IV  1 g Intravenous Q12H  . dronabinol  2.5 mg Oral BID AC  . feeding supplement (JEVITY 1.2 CAL)  1,000 mL Per Tube Q24H  . fluconazole  200 mg Oral Daily  . furosemide  80 mg Intravenous BID  . lactose free nutrition  237 mL Oral TID BM  . magnesium oxide  400 mg Per Tube BID  . multivitamin with minerals  1 tablet Oral Daily  . pantoprazole  40 mg Oral BID  . polyethylene glycol  17 g Oral Daily  . potassium chloride  40 mEq Per Tube BID  . protein supplement  1 scoop Oral TID WC  . sucralfate  1 g Oral TID WC & HS  . vancomycin  750 mg  Intravenous Q24H    Continuous Infusions:   Larey Seat, RD, LDN Pager #: [phone] After-Hours Pager #: [phone]

## 2013-09-07 NOTE — Progress Notes (Signed)
ANTIBIOTIC CONSULT NOTE - INITIAL  Pharmacy Consult for Vancomycin, Cefepime Indication: HCAP  No Known Allergies  Patient Measurements: Height: 4\' 11"  (149.9 cm) Weight: 110 lb 8 oz (50.122 kg) IBW/kg (Calculated) : 43.2  Vital Signs: Temp: 97.6 F (36.4 C) (05/17 0600) Temp src: Axillary (05/17 0600) BP: 123/80 mmHg (05/17 0600) Pulse Rate: 89 (05/17 0600) Intake/Output from previous day: 05/16 0701 - 05/17 0700 In: 1248 [P.O.:90; NG/GT:488; IV Piggyback:550] Out: 4365 [Urine:4365]  Labs:  Recent Labs  09/05/13 0356 09/06/13 0518 09/07/13 0445  WBC 9.8 15.4* 23.0*  HGB 9.0* 9.8* 10.8*  PLT 69* 60* 62*  CREATININE  --  0.95 0.93   Estimated Creatinine Clearance: 36.2 ml/min (by C-G formula based on Cr of 0.93).  Microbiology: Recent Results (from the past 720 hour(s))  URINE CULTURE     Status: None   Collection Time    08/12/13  4:07 PM      Result Value Ref Range Status   Urine Culture, Routine Culture, Urine   Final   Comment: ------------------------------------------------------------------------     CCU     Final - ===== COLONY COUNT: =====     >=100,000 COLONIES/ML     ENTEROBACTER AEROGENES          ------------------------------------------------------------------------      ENTEROBACTER AEROGENES             AMOX/CLAVULANIC                  MIC      Resistant       >=32 ug/ml        PIPERACILLIN/TAZO                MIC      Sensitive        <=4 ug/ml        IMIPENEM                         MIC      Sensitive          1 ug/ml        CEFAZOLIN                        MIC      Resistant            ug/ml        CEFTRIAXONE                      MIC      Sensitive        <=1 ug/ml        CEFTAZIDIME                      MIC      Sensitive        <=1 ug/ml        CEFEPIME                         MIC      Sensitive        <=1 ug/ml        GENTAMICIN                       MIC      Sensitive        <=1 ug/ml  TOBRAMYCIN                       MIC       Sensitive        <=1 ug/ml        CIPROFLOXACIN                    MIC      Sensitive     <=0.25 ug/ml        LEVOFLOXACIN                     MIC      Sensitive     <=0.12 ug/ml        NITROFURANTOIN                   MIC      Indeterminate     64 ug/ml        TRIMETH/SULFA                    MIC      Sensitive       <=20 ug/ml     END OF REPORT  TECHNOLOGIST REVIEW     Status: None   Collection Time    08/26/13  9:07 AM      Result Value Ref Range Status   Technologist Review few lymphs, oc mono   Final  MRSA PCR SCREENING     Status: None   Collection Time    09/01/13  2:09 AM      Result Value Ref Range Status   MRSA by PCR NEGATIVE  NEGATIVE Final   Comment:            The GeneXpert MRSA Assay (FDA     approved for NASAL specimens     only), is one component of a     comprehensive MRSA colonization     surveillance program. It is not     intended to diagnose MRSA     infection nor to guide or     monitor treatment for     MRSA infections.    Medical History: Past Medical History  Diagnosis Date  . Diverticulitis   . 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    Medications:  Anti-infectives   Start     Dose/Rate Route Frequency Ordered Stop   08/31/13 1000  levofloxacin (LEVAQUIN) tablet 500 mg     500 mg Oral Daily 08/31/13 0936 09/06/13 1133   09/20/2013 1830  fluconazole (DIFLUCAN) tablet 200 mg     200 mg Oral Daily 09/10/2013 1720     09/01/2013 1730  levofloxacin (LEVAQUIN) tablet 500 mg  Status:  Discontinued     500 mg Oral Daily 08/26/2013 1720 08/31/2013 1721   08/25/2013 1730  fluconazole (DIFLUCAN) 40 MG/ML suspension 200 mg  Status:  Discontinued     200 mg Oral Daily 09/10/2013 1721 09/08/2013 1731     Assessment: 50 yoF admitted 5/9 with weakness, 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  On admission WBC 0.3 with ANC 0.2; WBC increased s/p  filgrastim on 5/12.  She has completed 7 days of empiric levofloxacin.  Pharmacy is now consulted to dose Vancomycin and Cefepime for HCAP.  Tmax: afebrile  WBCs: 23 (filgrastim 5/12)  Renal: SCr 0.93, CrCl ~ 36 ml/min   Goal of  Therapy:  Vancomycin trough level 15-20 mcg/ml Appropriate abx dosing, eradication of infection.  Plan:   Cefepime 1g IV q12h  Vancomycin 750mg  IV q24h.  Measure Vanc trough at steady state.  Follow up renal fxn and culture results.   Gretta Arab PharmD, BCPS Pager [phone] 09/07/2013 9:23 AM

## 2013-09-08 ENCOUNTER — Inpatient Hospital Stay (HOSPITAL_COMMUNITY): Payer: Medicare PPO

## 2013-09-08 LAB — GLUCOSE, CAPILLARY
GLUCOSE-CAPILLARY: 139 mg/dL — AB (ref 70–99)
GLUCOSE-CAPILLARY: 154 mg/dL — AB (ref 70–99)
Glucose-Capillary: 130 mg/dL — ABNORMAL HIGH (ref 70–99)
Glucose-Capillary: 132 mg/dL — ABNORMAL HIGH (ref 70–99)
Glucose-Capillary: 155 mg/dL — ABNORMAL HIGH (ref 70–99)

## 2013-09-08 LAB — DIFFERENTIAL
BASOS ABS: 0 10*3/uL (ref 0.0–0.1)
Basophils Relative: 0 % (ref 0–1)
EOS PCT: 0 % (ref 0–5)
Eosinophils Absolute: 0 10*3/uL (ref 0.0–0.7)
LYMPHS ABS: 0.6 10*3/uL — AB (ref 0.7–4.0)
LYMPHS PCT: 2 % — AB (ref 12–46)
MONOS PCT: 2 % — AB (ref 3–12)
Monocytes Absolute: 0.6 10*3/uL (ref 0.1–1.0)
NEUTROS PCT: 96 % — AB (ref 43–77)
Neutro Abs: 27.4 10*3/uL — ABNORMAL HIGH (ref 1.7–7.7)

## 2013-09-08 LAB — BASIC METABOLIC PANEL
BUN: 23 mg/dL (ref 6–23)
CHLORIDE: 93 meq/L — AB (ref 96–112)
CO2: 36 mEq/L — ABNORMAL HIGH (ref 19–32)
CREATININE: 0.8 mg/dL (ref 0.50–1.10)
Calcium: 8.3 mg/dL — ABNORMAL LOW (ref 8.4–10.5)
GFR calc non Af Amer: 71 mL/min — ABNORMAL LOW (ref >90)
GFR, EST AFRICAN AMERICAN: 82 mL/min — AB (ref >90)
Glucose, Bld: 125 mg/dL — ABNORMAL HIGH (ref 70–99)
Potassium: 3 mEq/L — ABNORMAL LOW (ref 3.7–5.3)
SODIUM: 139 meq/L (ref 137–147)

## 2013-09-08 LAB — CBC
HCT: 30.7 % — ABNORMAL LOW (ref 36.0–46.0)
Hemoglobin: 10.4 g/dL — ABNORMAL LOW (ref 12.0–15.0)
MCH: 29.5 pg (ref 26.0–34.0)
MCHC: 33.9 g/dL (ref 30.0–36.0)
MCV: 87.2 fL (ref 78.0–100.0)
PLATELETS: 52 10*3/uL — AB (ref 150–400)
RBC: 3.52 MIL/uL — ABNORMAL LOW (ref 3.87–5.11)
RDW: 17.3 % — AB (ref 11.5–15.5)
WBC: 28.6 10*3/uL — AB (ref 4.0–10.5)

## 2013-09-08 LAB — PREPARE PLATELET PHERESIS
UNIT DIVISION: 0
Unit division: 0

## 2013-09-08 LAB — POTASSIUM: Potassium: 2.6 mEq/L — CL (ref 3.7–5.3)

## 2013-09-08 LAB — PHOSPHORUS: Phosphorus: 1.5 mg/dL — ABNORMAL LOW (ref 2.3–4.6)

## 2013-09-08 MED ORDER — K PHOS MONO-SOD PHOS DI & MONO 155-852-130 MG PO TABS
500.0000 mg | ORAL_TABLET | Freq: Three times a day (TID) | ORAL | Status: DC
  Administered 2013-09-08 – 2013-09-09 (×4): 500 mg via ORAL
  Filled 2013-09-08 (×6): qty 2

## 2013-09-08 MED ORDER — ADULT MULTIVITAMIN LIQUID CH
5.0000 mL | Freq: Every day | ORAL | Status: DC
  Administered 2013-09-08 – 2013-09-09 (×2): 5 mL
  Filled 2013-09-08 (×5): qty 5

## 2013-09-08 MED ORDER — POTASSIUM CHLORIDE 10 MEQ/100ML IV SOLN
10.0000 meq | INTRAVENOUS | Status: AC
  Administered 2013-09-08 (×3): 10 meq via INTRAVENOUS
  Filled 2013-09-08 (×3): qty 100

## 2013-09-08 NOTE — Progress Notes (Addendum)
NUTRITION FOLLOW UP  Intervention:   - Monitor magnesium, potassium, and phosphorus daily for at least 3 days, MD to replete as needed, as pt is at risk for refeeding syndrome given severe malnutrition. - Continue Jevity 1.2 at 20 ml/hr via NGT. Do not advance at this time due to continued low potassium and phosphorus.  - When ready to advance, increase Jevity 1.2 by 10 ml every 12 hours to goal rate of 50 ml/hr, provides 1440 kcal, 67 g protein, 968 ml free water.  - Recommend Regular, liberalized diet to encourage PO intake.  - D/c Boost Plus TID and 1 scoop Beneprotein TID orally as pt has not consumed these in 2 days, will monitor and add back as needed  - Change multivitamin to 35m daily per NGT as pt has been refusing oral tablet yesterday and today  - Recommend MD address pt's c/o burning stomach pain - RD to continue to monitor    NUTRITION DIAGNOSIS:  Inadequate oral intake related to poor appetite as evidenced by pt report. Ongoing.   Goal:  Pt to consume >90% of meals/supplements - not met  New goals: 1. Refeeding labs WNL 2. 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 She has had a craniotomy and started chemo last week. She has had a poor appetite and has had to be given IVF multiple times over the past week. She comes in now for severe weakness and is found to be hypokalemic and pancytopenic. She will be admitted for K replacement, blood transfusion and IVF.   [facility]11[facility] Pt not detailed in answers - reports poor appetite for a long time, not eating food at home, only drinking 1-2 Boost/day. Denied any problems chewing or swallowing. Had episode of vomiting on Friday but denied any further vomiting since then. Denies any nausea today. Pt with severe  cachexia in arms and hands, with moderate fat loss in temporal/orbital region. Pt has lost 15 pounds unintentionally in the past 2 months. RN reports the only thing pt has consumed today has been at small amount of oatmeal.   5/16: Family desires G-tube. Per chart, pt was scheduled for g-tube placement 5/15, however pt with low O2 sats and not a candidate for moderate sedation, NGT placed with tip in distal stomach. RD consulted to initiate feedings. RD spoke with RN via telephone. Pt is eating barely anything. Ordered for Heart Healthy diet, Boost Plus TID, and 1 scoop Beneprotein TID. RN reports that she was able to get 1 scoop of Beneprotein into the patient this morning. Currently receiving marinol. Current weight is +18 lb admit weight. RD received consult for TF management and initiated feedings of Jevity 1.2 at 20 ml/hr via NGT. Do not advance until refeeding electrolytes (potassium, magnesium and phosphorus) are repleted  5/17: Patient is currently receiving Jevity 1.2 at 20 ml/hr with orders to not advance until refeeding labs are repleted. Patient is tolerating this well. She continues to receive meals, Boost Plus TID, and Beneprotein TID. However, intake of this is minimal. Patient is receiving KCl and Magnesium supplementation. Potassium is improved, but still low. Per RN, patient continues to receive Lasix, which is likely contributing to depletion of potassium.   5/18: Pt with low potassium and phosphorus that dropped overnight. Getting 5033mof  K phos neutral TID and 3 runs of KCl today. RN reports pt without TF residuals. Pt c/o burning pain in stomach. States she did not eat anything yesterday or today.    TF: Jevity 1.2 at 74m/hr via NGT - provides 576 calories, 27g protein meeting 44% estimated calorie needs, 41% estimated protein needs   Potassium  Date/Time Value Ref Range Status  09/08/2013  9:20 AM 2.6* 3.7 - 5.3 mEq/L Final     CRITICAL RESULT CALLED TO, READ BACK BY AND  VERIFIED WITH:     PHILLIPS,K. RN AT [id number]/15 BARFIELD,T  09/08/2013  4:12 AM 3.0* 3.7 - 5.3 mEq/L Final  09/07/2013  4:45 AM 2.7* 3.7 - 5.3 mEq/L Final     CRITICAL RESULT CALLED TO, READ BACK BY AND VERIFIED WITH:     ADDISON,C/4E @0543  ON 09/07/13 BY KARCZEWSKI,S.  08/26/2013  9:07 AM 2.9* 3.5 - 5.1 mEq/L Final  08/19/2013  8:32 AM 4.0  3.5 - 5.1 mEq/L Final  08/12/2013  1:50 PM 4.6  3.5 - 5.1 mEq/L Final    Phosphorus  Date/Time Value Ref Range Status  09/08/2013  9:20 AM 1.5* 2.3 - 4.6 mg/dL Final  09/06/2013  5:18 AM 2.6  2.3 - 4.6 mg/dL Final  07/10/2013  2:45 AM 2.1* 2.3 - 4.6 mg/dL Final    Magnesium  Date/Time Value Ref Range Status  09/07/2013  4:45 AM 1.5  1.5 - 2.5 mg/dL Final  09/06/2013  5:18 AM 1.1* 1.5 - 2.5 mg/dL Final  09/01/2013  1:03 AM 0.8* 1.5 - 2.5 mg/dL Final     CRITICAL RESULT CALLED TO, READ BACK BY AND VERIFIED WITH:     MREEVES RN AT [id number] DLONG     Height: Ht Readings from Last 1 Encounters:  08/27/2013 4' 11"  (1.499 m)    Weight Status:   Wt Readings from Last 1 Encounters:  09/08/13 93 lb 14.7 oz (42.6 kg)  Admit wt         92 lb 2.4 oz (41.8 kg)    Re-estimated needs:  Kcal: 1300-1500  Protein: 65-80 g  Fluid: 1.3-1.5L/day  Skin: stage II R ischium  MASD to R buttock  Diet Order: Cardiac   Intake/Output Summary (Last 24 hours) at 09/08/13 1045 Last data filed at 09/08/13 0947  Gross per 24 hour  Intake    522 ml  Output   5175 ml  Net  -4653 ml    Last BM: 5/16   Labs:   Recent Labs Lab 09/06/13 0518  09/07/13 0445 09/08/13 0412 09/08/13 0920  NA 138  --  140 139  --   K 2.2*  < > 2.7* 3.0* 2.6*  CL 102  --  98 93*  --   CO2 21  --  29 36*  --   BUN 25*  --  25* 23  --   CREATININE 0.95  --  0.93 0.80  --   CALCIUM 8.3*  --  8.5 8.3*  --   MG 1.1*  --  1.5  --   --   PHOS 2.6  --   --   --  1.5*  GLUCOSE 92  --  171* 125*  --   < > = values in this interval not displayed.  CBG (last 3)   Recent  Labs  09/07/13 2347 09/08/13 0417 09/08/13 0745  GLUCAP 139* 130* 139*    Scheduled Meds: . allopurinol  100 mg Oral BID  .  amiodarone  400 mg Oral Daily  . antiseptic oral rinse  15 mL Mouth Rinse q12n4p  . budesonide-formoterol  2 puff Inhalation BID  . carvedilol  3.125 mg Oral BID WC  . ceFEPime (MAXIPIME) IV  1 g Intravenous Q12H  . dronabinol  2.5 mg Oral BID AC  . feeding supplement (JEVITY 1.2 CAL)  1,000 mL Per Tube Q24H  . fluconazole  200 mg Oral Daily  . lactose free nutrition  237 mL Oral TID BM  . magnesium oxide  400 mg Per Tube BID  . multivitamin with minerals  1 tablet Oral Daily  . pantoprazole  40 mg Oral BID  . polyethylene glycol  17 g Oral Daily  . potassium chloride  10 mEq Intravenous Q1 Hr x 3  . potassium chloride  40 mEq Per Tube BID  . protein supplement  1 scoop Oral TID WC  . sucralfate  1 g Oral TID WC & HS  . vancomycin  750 mg Intravenous Q24H    Mikey College MS, RD, LDN [phone] Pager [phone] After Hours Pager

## 2013-09-08 NOTE — Progress Notes (Signed)
Critical Value- K+ 2.6 MD saw value before RN had a minute to page. Ordered IV K+. Will continue to monitor. Callie Fielding RN

## 2013-09-08 NOTE — Progress Notes (Signed)
[facility] PROGRESS NOTE  Rachael Wall JKK:[id number] DOB: 08-13-1940 DOA: 08/25/2013 PCP: [name], MD  Assessment/Plan: Severe Pancytopenia, Chemotherapy-Induced  -5/10: WBC 0.2, Hb 6.1, Plt <5.  -5/11: WBC 0.4, Hb 9.5, Plt 75 after transfusion of 2 units of PRBCs and 2 units of pheresed platelets.  -5/12: WBC 1.1, Hb 10.8, plt 75.  - Patient received Neulasta after recent chemotherapy. I suspect that current rise in WBC from 0.3 to 28 is related to this.  Will continue to monitor closely.  Acute on chronic systolic heart failure - Elevated BNP of more than 70,000. Patient subsequently started on Lasix.  - X-ray showing worsening pulmonary edema. - Patient diuresed and was successfully diuresed at or near dry weight. Lasix will be held as a result. Pt has been able to be weaned off venti mask and on to nasal cannula. - Continue to assess daily weights and I and O's,  - Continue carvedilol in context of patient with recent new onset A. Fib.  SOB - Most likely related to recent fluid overload. And WBC elevation most likely due to neulasta.  Nonetheless will cover her with antibiotics at this juncture given her prolonged hospital stay. Patient is not coughing up phlem and has been afebrile so my index of suspicion for infection is low. As such threshold to d/c antibiotics will be low with clinical improvement. - resolving with diuresis.  New Onset A Fib with RVR  - Has converted back to NSR ; amio and B blocker - Cardiology recommended amio and B blocker.  Elevated Troponin  -Denies CP/SOB.  -ECHO with EF 20-25%. N -Repeat EKG today.  -Card recommending medical management.  [place] Carcinoma on Lung  -Continue OP management with Oncology. (Dr. Julien Nordmann).   Hypokalemia - Patient this AM given IV K runs - Replace via tube and reassess next am. - Will continue to monitor patient on Tele   Hypophosphatemia - Last level on check was 1.5. Will replace orally and reassess  next am.  Hypomagnesemia - resolved after IV replacement - Continue po magnesium replacement. Reassess next am.  Hyponatremia  - Resolved with IVF's - Pt saline locked currently  Severe Protein-Caloric Malnutrition   -Nutrition consult.  - Continue to encourage PO intake. - Pt has NG tube. RD for tube feedings. - Multivitamin on board.   Code Status: DNR Family Communication: discussed with daughter at bedside.  Disposition Plan: With continued improvement and after PEG placement   Consultants:  Oncology  Interventional radiology  Procedures:  None  Antibiotics:  Cefepime   Vancomycin  HPI/Subjective: Pt has no new complaints currently. States that her SOB is improved. Nursing reports that they have been able to wean patient to nasal canula from venti mask.  Objective: Filed Vitals:   09/08/13 0525  BP: 118/83  Pulse: 88  Temp: 97.9 F (36.6 C)  Resp: 28    Intake/Output Summary (Last 24 hours) at 09/08/13 1236 Last data filed at 09/08/13 0947  Gross per 24 hour  Intake    522 ml  Output   5175 ml  Net  -4653 ml   Filed Weights   09/06/13 0500 09/07/13 0600 09/08/13 0424  Weight: 49.9 kg (110 lb 0.2 oz) 50.122 kg (110 lb 8 oz) 42.6 kg (93 lb 14.7 oz)    Exam:   General:  Pt in NAD, alert and awake  Cardiovascular: RRR, no MRG  Respiratory: no wheezes, decreased breath sounds at bases with mild rhales. Breath sounds BL  Abdomen:  soft, NT, ND  Musculoskeletal: no cyanosis or clubbing   Data Reviewed: Basic Metabolic Panel:  Recent Labs Lab 09/03/13 0757 09/04/13 0355 09/06/13 0518 09/06/13 1650 09/07/13 0445 09/08/13 0412 09/08/13 0920  NA 140 138 138  --  140 139  --   K 5.9* 4.5 2.2* 2.8* 2.7* 3.0* 2.6*  CL 110 106 102  --  98 93*  --   CO2 19 18* 21  --  29 36*  --   GLUCOSE 93 102* 92  --  171* 125*  --   BUN 21 23 25*  --  25* 23  --   CREATININE 0.88 0.91 0.95  --  0.93 0.80  --   CALCIUM 8.5 8.5 8.3*  --  8.5 8.3*  --    MG  --   --  1.1*  --  1.5  --   --   PHOS  --   --  2.6  --   --   --  1.5*   Liver Function Tests: No results found for this basename: AST, ALT, ALKPHOS, BILITOT, PROT, ALBUMIN,  in the last 168 hours No results found for this basename: LIPASE, AMYLASE,  in the last 168 hours No results found for this basename: AMMONIA,  in the last 168 hours CBC:  Recent Labs Lab 09/04/13 0355 09/05/13 0356 09/06/13 0518 09/07/13 0445 09/08/13 0412  WBC 4.7 9.8 15.4* 23.0* 28.6*  NEUTROABS 3.8 8.4* 11.0* 14.2* 27.4*  HGB 9.6* 9.0* 9.8* 10.8* 10.4*  HCT 28.5* 26.2* 28.1* 30.3* 30.7*  MCV 88.8 87.6 86.7 84.9 87.2  PLT 37* 69* 60* 62* 52*   Cardiac Enzymes:  Recent Labs Lab 09/01/13 1411  TROPONINI 0.62*   BNP (last 3 results)  Recent Labs  09/03/13 0404 09/06/13 0518  PROBNP 50074.0* >70000.0*   CBG:  Recent Labs Lab 09/07/13 1614 09/07/13 1957 09/07/13 2347 09/08/13 0417 09/08/13 0745  GLUCAP 130* 133* 139* 130* 139*    Recent Results (from the past 240 hour(s))  MRSA PCR SCREENING     Status: None   Collection Time    09/01/13  2:09 AM      Result Value Ref Range Status   MRSA by PCR NEGATIVE  NEGATIVE Final   Comment:            The GeneXpert MRSA Assay (FDA     approved for NASAL specimens     only), is one component of a     comprehensive MRSA colonization     surveillance program. It is not     intended to diagnose MRSA     infection nor to guide or     monitor treatment for     MRSA infections.     Studies: Dg Chest Port 1 View  09/08/2013   CLINICAL DATA:  Short of breath.  Weakness.  EXAM: PORTABLE CHEST - 1 VIEW  COMPARISON:  09/05/2013  FINDINGS: Irregular interstitial thickening and bilateral airspace opacities have improved. There is still some residual airspace opacity in the right upper lobe.  Denser opacity is noted in the medial left lung base superimposed on the cardiac silhouette. This is without significant change. Small pleural effusions  are noted, which appear decreased in size from the prior exam.  No pneumothorax.  Enteric tube passes well below the diaphragm and below the included field of view, unchanged.  IMPRESSION: 1. Improved lung aeration. Findings most consistent with improved pulmonary edema. 2. Persistent left lung base opacity, most likely  atelectasis. Infiltrate/pneumonia is possible. 3. Small pleural effusions, decreased from the prior study.   Electronically Signed   By: Lajean Manes M.D.   On: 09/08/2013 10:38    Scheduled Meds: . allopurinol  100 mg Oral BID  . amiodarone  400 mg Oral Daily  . antiseptic oral rinse  15 mL Mouth Rinse q12n4p  . budesonide-formoterol  2 puff Inhalation BID  . carvedilol  3.125 mg Oral BID WC  . ceFEPime (MAXIPIME) IV  1 g Intravenous Q12H  . dronabinol  2.5 mg Oral BID AC  . feeding supplement (JEVITY 1.2 CAL)  1,000 mL Per Tube Q24H  . fluconazole  200 mg Oral Daily  . magnesium oxide  400 mg Per Tube BID  . multivitamin  5 mL Per Tube Daily  . pantoprazole  40 mg Oral BID  . phosphorus  500 mg Oral TID  . polyethylene glycol  17 g Oral Daily  . potassium chloride  10 mEq Intravenous Q1 Hr x 3  . sucralfate  1 g Oral TID WC & HS  . vancomycin  750 mg Intravenous Q24H   Continuous Infusions:    Time spent: > 45 minutes    [place] [facility] Pager [phone] If 7PM-7AM, please contact night-coverage at [email or web address], password [facility] 09/08/2013, 12:36 PM  LOS: 9 days

## 2013-09-09 ENCOUNTER — Ambulatory Visit: Payer: Commercial Managed Care - HMO | Admitting: Physician Assistant

## 2013-09-09 ENCOUNTER — Other Ambulatory Visit: Payer: Commercial Managed Care - HMO

## 2013-09-09 ENCOUNTER — Ambulatory Visit: Payer: Commercial Managed Care - HMO

## 2013-09-09 LAB — CBC
HCT: 30.1 % — ABNORMAL LOW (ref 36.0–46.0)
HEMOGLOBIN: 10.1 g/dL — AB (ref 12.0–15.0)
MCH: 29.6 pg (ref 26.0–34.0)
MCHC: 33.6 g/dL (ref 30.0–36.0)
MCV: 88.3 fL (ref 78.0–100.0)
Platelets: 63 10*3/uL — ABNORMAL LOW (ref 150–400)
RBC: 3.41 MIL/uL — ABNORMAL LOW (ref 3.87–5.11)
RDW: 17.1 % — AB (ref 11.5–15.5)
WBC: 27.4 10*3/uL — ABNORMAL HIGH (ref 4.0–10.5)

## 2013-09-09 LAB — BASIC METABOLIC PANEL
BUN: 24 mg/dL — ABNORMAL HIGH (ref 6–23)
CHLORIDE: 88 meq/L — AB (ref 96–112)
CO2: 42 meq/L — AB (ref 19–32)
Calcium: 7.8 mg/dL — ABNORMAL LOW (ref 8.4–10.5)
Creatinine, Ser: 0.69 mg/dL (ref 0.50–1.10)
GFR calc Af Amer: >90 mL/min (ref >90)
GFR calc non Af Amer: 84 mL/min — ABNORMAL LOW (ref >90)
GLUCOSE: 120 mg/dL — AB (ref 70–99)
POTASSIUM: 2.8 meq/L — AB (ref 3.7–5.3)
SODIUM: 139 meq/L (ref 137–147)

## 2013-09-09 LAB — DIFFERENTIAL
Band Neutrophils: 10 % (ref 0–10)
Basophils Absolute: 0.3 10*3/uL — ABNORMAL HIGH (ref 0.0–0.1)
Basophils Relative: 1 % (ref 0–1)
Eosinophils Absolute: 0 10*3/uL (ref 0.0–0.7)
Eosinophils Relative: 0 % (ref 0–5)
LYMPHS ABS: 1.9 10*3/uL (ref 0.7–4.0)
Lymphocytes Relative: 7 % — ABNORMAL LOW (ref 12–46)
METAMYELOCYTES PCT: 6 %
MYELOCYTES: 7 %
Monocytes Absolute: 1.4 10*3/uL — ABNORMAL HIGH (ref 0.1–1.0)
Monocytes Relative: 5 % (ref 3–12)
Neutro Abs: 23.8 10*3/uL — ABNORMAL HIGH (ref 1.7–7.7)
Neutrophils Relative %: 62 % (ref 43–77)
Promyelocytes Absolute: 2 %

## 2013-09-09 LAB — GLUCOSE, CAPILLARY
GLUCOSE-CAPILLARY: 113 mg/dL — AB (ref 70–99)
GLUCOSE-CAPILLARY: 115 mg/dL — AB (ref 70–99)
GLUCOSE-CAPILLARY: 130 mg/dL — AB (ref 70–99)
GLUCOSE-CAPILLARY: 132 mg/dL — AB (ref 70–99)
Glucose-Capillary: 126 mg/dL — ABNORMAL HIGH (ref 70–99)
Glucose-Capillary: 123 mg/dL — ABNORMAL HIGH (ref 70–99)

## 2013-09-09 LAB — PHOSPHORUS: Phosphorus: 2.6 mg/dL (ref 2.3–4.6)

## 2013-09-09 LAB — MAGNESIUM: Magnesium: 1.1 mg/dL — ABNORMAL LOW (ref 1.5–2.5)

## 2013-09-09 MED ORDER — MAGNESIUM SULFATE 40 MG/ML IJ SOLN
2.0000 g | Freq: Once | INTRAMUSCULAR | Status: AC
  Administered 2013-09-09: 2 g via INTRAVENOUS
  Filled 2013-09-09: qty 50

## 2013-09-09 MED ORDER — POTASSIUM CHLORIDE 20 MEQ/15ML (10%) PO LIQD
40.0000 meq | Freq: Every day | ORAL | Status: DC
Start: 2013-09-09 — End: 2013-09-10
  Administered 2013-09-09: 40 meq via ORAL
  Filled 2013-09-09 (×2): qty 30

## 2013-09-09 MED ORDER — POTASSIUM CHLORIDE 10 MEQ/100ML IV SOLN
10.0000 meq | INTRAVENOUS | Status: AC
  Administered 2013-09-09 (×4): 10 meq via INTRAVENOUS
  Filled 2013-09-09 (×4): qty 100

## 2013-09-09 NOTE — Progress Notes (Addendum)
NUTRITION FOLLOW UP  Intervention:   - Monitor magnesium, potassium, and phosphorus daily for at least 3 days, MD to replete as needed, as pt is at risk for refeeding syndrome given severe malnutrition. - Continue Jevity 1.2 at 20 ml/hr via NGT. Do not advance at this time due to continued low potassium and magnesium.  - When ready to advance, increase Jevity 1.2 by 10 ml every 12 hours to goal rate of 50 ml/hr, provides 1440 kcal, 67 g protein, 968 ml free water.  - Recommend Regular, liberalized diet to encourage PO intake.  - Continue with appetite stimulant - Will continue to monitor  NUTRITION DIAGNOSIS:  Inadequate oral intake related to poor appetite as evidenced by pt report. Ongoing.   Goal:  Pt to consume >90% of meals/supplements - not met  New goals: 1. Refeeding labs WNL 2. 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 She has had a craniotomy and started chemo last week. She has had a poor appetite and has had to be given IVF multiple times over the past week. She comes in now for severe weakness and is found to be hypokalemic and pancytopenic. She will be admitted for K replacement, blood transfusion and IVF.   5/11: Pt has been seen by [facility] RD. Pt not detailed in answers - reports poor appetite for a long time, not eating food at home, only drinking 1-2 Boost/day. Denied any problems chewing or swallowing. Had episode of vomiting on Friday but denied any further vomiting since then. Denies any nausea today. Pt with severe cachexia in arms and hands, with moderate fat loss in temporal/orbital region. Pt has lost 15 pounds unintentionally in the past 2 months. RN reports the only thing pt has consumed today has been at small amount of oatmeal.   5/16: Family desires G-tube. Per  chart, pt was scheduled for g-tube placement 5/15, however pt with low O2 sats and not a candidate for moderate sedation, NGT placed with tip in distal stomach. RD consulted to initiate feedings. RD spoke with RN via telephone. Pt is eating barely anything. Ordered for Heart Healthy diet, Boost Plus TID, and 1 scoop Beneprotein TID. RN reports that she was able to get 1 scoop of Beneprotein into the patient this morning. Currently receiving marinol. Current weight is +18 lb admit weight. RD received consult for TF management and initiated feedings of Jevity 1.2 at 20 ml/hr via NGT. Do not advance until refeeding electrolytes (potassium, magnesium and phosphorus) are repleted  5/17: Patient is currently receiving Jevity 1.2 at 20 ml/hr with orders to not advance until refeeding labs are repleted. Patient is tolerating this well. She continues to receive meals, Boost Plus TID, and Beneprotein TID. However, intake of this is minimal. Patient is receiving KCl and Magnesium supplementation. Potassium is improved, but still low. Per RN, patient continues to receive Lasix, which is likely contributing to depletion of potassium.   5/19: Pt with low potassium and phosphorus that dropped overnight. Getting 592m of K phos neutral TID and 3 runs of KCl today. RN reports pt without TF residuals. Pt c/o burning pain in stomach. States she did not eat anything yesterday or today.    TF: Jevity 1.2 at 234mhr via NGT - provides 576 calories, 27g protein meeting 44% estimated calorie needs, 41% estimated  protein needs  5/19: -Phosphorus now WNL.  Pt receiving KPhos  -Mg/K continue to remain low. Receiving potassium chloride, magnesium oxide and magnesium sulfate -Tolerating Jevity 1.2 at 20 ml/hr w/out residuals. No plans for advancement d/t low refeeding labs -Per discussion with RN, pt is without appetite and has been refusing meals. Offered pt a variety of different food options-different snacks, sweets,  supplements- all of which patient declined. Diet has not been liberalized, will continue to recommend; however it is unclear to whether this will prove beneficial d/t pt's current condition -Were able to transition pt off venti mask to nasal cannula; however this does not appear to have improved PO intake -Continue with Marinol  Potassium  Date/Time Value Ref Range Status  09/09/2013  3:55 AM 2.8* 3.7 - 5.3 mEq/L Final     CRITICAL RESULT CALLED TO, READ BACK BY AND VERIFIED WITH:     REINERT,L/4E @0503  ON 09/09/13 BY KARCZEWSKI,S.  09/08/2013  9:20 AM 2.6* 3.7 - 5.3 mEq/L Final     CRITICAL RESULT CALLED TO, READ BACK BY AND VERIFIED WITH:     PHILLIPS,K. RN AT 1610 09/08/13 BARFIELD,T  09/08/2013  4:12 AM 3.0* 3.7 - 5.3 mEq/L Final  08/26/2013  9:07 AM 2.9* 3.5 - 5.1 mEq/L Final  08/19/2013  8:32 AM 4.0  3.5 - 5.1 mEq/L Final  08/12/2013  1:50 PM 4.6  3.5 - 5.1 mEq/L Final    Phosphorus  Date/Time Value Ref Range Status  09/09/2013  3:55 AM 2.6  2.3 - 4.6 mg/dL Final  09/08/2013  9:20 AM 1.5* 2.3 - 4.6 mg/dL Final  09/06/2013  5:18 AM 2.6  2.3 - 4.6 mg/dL Final    Magnesium  Date/Time Value Ref Range Status  09/09/2013  3:55 AM 1.1* 1.5 - 2.5 mg/dL Final  09/07/2013  4:45 AM 1.5  1.5 - 2.5 mg/dL Final  09/06/2013  5:18 AM 1.1* 1.5 - 2.5 mg/dL Final     Height: Ht Readings from Last 1 Encounters:  09/17/2013 4' 11"  (1.499 m)    Weight Status:   Wt Readings from Last 1 Encounters:  09/09/13 93 lb 4.1 oz (42.3 kg)  Admit wt         92 lb 2.4 oz (41.8 kg)    Re-estimated needs:  Kcal: 1300-1500  Protein: 65-80 g  Fluid: 1.3-1.5L/day  Skin: stage II R ischium  MASD to R buttock  Diet Order: Cardiac   Intake/Output Summary (Last 24 hours) at 09/09/13 1501 Last data filed at 09/09/13 1426  Gross per 24 hour  Intake    440 ml  Output   1000 ml  Net   -560 ml    Last BM: 5/16   Labs:   Recent Labs Lab 09/06/13 0518  09/07/13 0445 09/08/13 0412 09/08/13 0920  09/09/13 0355  NA 138  --  140 139  --  139  K 2.2*  < > 2.7* 3.0* 2.6* 2.8*  CL 102  --  98 93*  --  88*  CO2 21  --  29 36*  --  42*  BUN 25*  --  25* 23  --  24*  CREATININE 0.95  --  0.93 0.80  --  0.69  CALCIUM 8.3*  --  8.5 8.3*  --  7.8*  MG 1.1*  --  1.5  --   --  1.1*  PHOS 2.6  --   --   --  1.5* 2.6  GLUCOSE 92  --  171* 125*  --  120*  < > = values in this interval not displayed.  CBG (last 3)   Recent Labs  09/09/13 0355 09/09/13 0839 09/09/13 1133  GLUCAP 126* 115* 132*    Scheduled Meds: . allopurinol  100 mg Oral BID  . amiodarone  400 mg Oral Daily  . antiseptic oral rinse  15 mL Mouth Rinse q12n4p  . budesonide-formoterol  2 puff Inhalation BID  . carvedilol  3.125 mg Oral BID WC  . ceFEPime (MAXIPIME) IV  1 g Intravenous Q12H  . dronabinol  2.5 mg Oral BID AC  . feeding supplement (JEVITY 1.2 CAL)  1,000 mL Per Tube Q24H  . magnesium oxide  400 mg Per Tube BID  . magnesium sulfate 1 - 4 g bolus IVPB  2 g Intravenous Once  . multivitamin  5 mL Per Tube Daily  . pantoprazole  40 mg Oral BID  . phosphorus  500 mg Oral TID  . polyethylene glycol  17 g Oral Daily  . potassium chloride  40 mEq Oral Daily  . sucralfate  1 g Oral TID WC & HS  . vancomycin  750 mg Intravenous Q24H   Atlee Abide MS RD LDN Clinical Dietitian [phone]

## 2013-09-09 NOTE — Progress Notes (Signed)
Critical Lab paged to hospitalist. K- 2.8 (yesterday 2.6) and CO2- 42 (yesterday 36). Pt not showing EKG changed. Pt's breathes are shallow, but SPO2 WNL. Pt on 3 L [place] and was on Venti yesterday.

## 2013-09-09 NOTE — Progress Notes (Signed)
[facility] PROGRESS NOTE  Rachael Wall HFG:[id number] DOB: 07-21-1940 DOA: 09/14/2013 PCP: [name], MD Brief Narrative: 73 y.o. 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 She has had a craniotomy and started chemo week prior to admission. She presented with severe thrombocytopenia and hospital stay was complicated by transient A. fib which cardiology helped manage and subsequently patient normal sinus rhythm. Also complicated by acute on chronic systolic heart failure after requiring platelet transfusion.  Currently being weaned off of oxygen after diuresis.  The patient has had weakness and poor oral intake and currently is not meeting her requirements by mouth. Family would like PEG tube placement with improvement in condition.   Assessment/Plan: Severe Pancytopenia, Chemotherapy-Induced  -5/10: WBC 0.2, Hb 6.1, Plt <5.  -5/11: WBC 0.4, Hb 9.5, Plt 75 after transfusion of 2 units of PRBCs and 2 units of pheresed platelets.  -5/12: WBC 1.1, Hb 10.8, plt 75.  - Patient received Neulasta after recent chemotherapy. I suspect that current rise in WBC from 0.3 to 28 is related to this.  Will continue to monitor closely.  Acute on chronic systolic heart failure - Elevated BNP of more than 70,000. Patient subsequently started on Lasix.  - X-ray showing worsening pulmonary edema. - Patient diuresed and was successfully diuresed at or near dry weight. Lasix will be held as a result. Pt has been able to be weaned off venti mask and on to nasal cannula. - Continue to assess daily weights and I and O's,  - Continue carvedilol in context of patient with recent new onset A. Fib.  SOB - Most likely related to recent fluid overload. And WBC elevation most likely due to neulasta.   - low threshold to discontinue antibiotics given low index of suspicion - resolving with diuresis.  New Onset A Fib with RVR   - Has converted back to NSR ; amio and B blocker - Cardiology recommended amio and B blocker.  Elevated Troponin  -Denies CP/SOB.  -ECHO with EF 20-25%. N -Repeat EKG today.  -Card recommending medical management.  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 (Dr. Julien Nordmann).   Hypokalemia - Patient this AM given IV K runs - Replace via tube and reassess next am. - Will continue to monitor patient on Tele   Hypophosphatemia - Last level on check was 2.6.  - Will discontinue oral replacement  Hypomagnesemia - requiring IV replacement today given repeat value of 1.1 - Continue po magnesium replacement. Reassess next am.  Hyponatremia  - Resolved with IVF's - Pt saline locked currently  Severe Protein-Caloric Malnutrition   -Nutrition consult.  - Continue to encourage PO intake. - Pt has NG tube. RD for tube feedings. - Multivitamin on board. - With improvement in respiratory status may consider reconsultation IR for PEG tube placement   Code Status: DNR Family Communication: no family at bedside.  Disposition Plan: With continued improvement in SOB and after PEG placement   Consultants:  Oncology  Interventional radiology  Procedures:  None  Antibiotics:  Cefepime   Vancomycin  HPI/Subjective: Pt has no new complaints currently. States that her SOB is improved.   Objective: Filed Vitals:   09/09/13 1300  BP: 115/65  Pulse: 95  Temp: 97.4 F (36.3 C)  Resp: 26    Intake/Output Summary (Last 24 hours) at 09/09/13 1559 Last data filed at 09/09/13 1426  Gross per 24 hour  Intake  440 ml  Output   1000 ml  Net   -560 ml   Filed Weights   09/07/13 0600 09/08/13 0424 09/09/13 0412  Weight: 50.122 kg (110 lb 8 oz) 42.6 kg (93 lb 14.7 oz) 42.3 kg (93 lb 4.1 oz)    Exam:   General:  Pt in NAD, alert and awake  Cardiovascular: RRR, no MRG  Respiratory: no wheezes, decreased breath sounds at bases.   Abdomen: soft, NT,  ND  Musculoskeletal: no cyanosis or clubbing   Data Reviewed: Basic Metabolic Panel:  Recent Labs Lab 09/04/13 0355 09/06/13 0518 09/06/13 1650 09/07/13 0445 09/08/13 0412 09/08/13 0920 09/09/13 0355  NA 138 138  --  140 139  --  139  K 4.5 2.2* 2.8* 2.7* 3.0* 2.6* 2.8*  CL 106 102  --  98 93*  --  88*  CO2 18* 21  --  29 36*  --  42*  GLUCOSE 102* 92  --  171* 125*  --  120*  BUN 23 25*  --  25* 23  --  24*  CREATININE 0.91 0.95  --  0.93 0.80  --  0.69  CALCIUM 8.5 8.3*  --  8.5 8.3*  --  7.8*  MG  --  1.1*  --  1.5  --   --  1.1*  PHOS  --  2.6  --   --   --  1.5* 2.6   Liver Function Tests: No results found for this basename: AST, ALT, ALKPHOS, BILITOT, PROT, ALBUMIN,  in the last 168 hours No results found for this basename: LIPASE, AMYLASE,  in the last 168 hours No results found for this basename: AMMONIA,  in the last 168 hours CBC:  Recent Labs Lab 09/05/13 0356 09/06/13 0518 09/07/13 0445 09/08/13 0412 09/09/13 0355  WBC 9.8 15.4* 23.0* 28.6* 27.4*  NEUTROABS 8.4* 11.0* 14.2* 27.4* 23.8*  HGB 9.0* 9.8* 10.8* 10.4* 10.1*  HCT 26.2* 28.1* 30.3* 30.7* 30.1*  MCV 87.6 86.7 84.9 87.2 88.3  PLT 69* 60* 62* 52* 63*   Cardiac Enzymes: No results found for this basename: CKTOTAL, CKMB, CKMBINDEX, TROPONINI,  in the last 168 hours BNP (last 3 results)  Recent Labs  09/03/13 0404 09/06/13 0518  PROBNP 50074.0* >70000.0*   CBG:  Recent Labs Lab 09/08/13 2206 09/09/13 0054 09/09/13 0355 09/09/13 0839 09/09/13 1133  GLUCAP 155* 130* 126* 115* 132*    Recent Results (from the past 240 hour(s))  MRSA PCR SCREENING     Status: None   Collection Time    09/01/13  2:09 AM      Result Value Ref Range Status   MRSA by PCR NEGATIVE  NEGATIVE Final   Comment:            The GeneXpert MRSA Assay (FDA     approved for NASAL specimens     only), is one component of a     comprehensive MRSA colonization     surveillance program. It is not     intended  to diagnose MRSA     infection nor to guide or     monitor treatment for     MRSA infections.     Studies: Dg Chest Port 1 View  09/08/2013   CLINICAL DATA:  Short of breath.  Weakness.  EXAM: PORTABLE CHEST - 1 VIEW  COMPARISON:  09/05/2013  FINDINGS: Irregular interstitial thickening and bilateral airspace opacities have improved. There is still some residual airspace opacity in the  right upper lobe.  Denser opacity is noted in the medial left lung base superimposed on the cardiac silhouette. This is without significant change. Small pleural effusions are noted, which appear decreased in size from the prior exam.  No pneumothorax.  Enteric tube passes well below the diaphragm and below the included field of view, unchanged.  IMPRESSION: 1. Improved lung aeration. Findings most consistent with improved pulmonary edema. 2. Persistent left lung base opacity, most likely atelectasis. Infiltrate/pneumonia is possible. 3. Small pleural effusions, decreased from the prior study.   Electronically Signed   By: Lajean Manes M.D.   On: 09/08/2013 10:38    Scheduled Meds: . allopurinol  100 mg Oral BID  . amiodarone  400 mg Oral Daily  . antiseptic oral rinse  15 mL Mouth Rinse q12n4p  . budesonide-formoterol  2 puff Inhalation BID  . carvedilol  3.125 mg Oral BID WC  . ceFEPime (MAXIPIME) IV  1 g Intravenous Q12H  . dronabinol  2.5 mg Oral BID AC  . feeding supplement (JEVITY 1.2 CAL)  1,000 mL Per Tube Q24H  . magnesium oxide  400 mg Per Tube BID  . magnesium sulfate 1 - 4 g bolus IVPB  2 g Intravenous Once  . multivitamin  5 mL Per Tube Daily  . pantoprazole  40 mg Oral BID  . phosphorus  500 mg Oral TID  . polyethylene glycol  17 g Oral Daily  . potassium chloride  40 mEq Oral Daily  . sucralfate  1 g Oral TID WC & HS  . vancomycin  750 mg Intravenous Q24H   Continuous Infusions:    Time spent: > 35 minutes    [facility] Pager [phone] If 7PM-7AM, please contact  night-coverage at [email or web address], password [facility] 09/09/2013, 3:59 PM  LOS: 10 days

## 2013-09-10 ENCOUNTER — Ambulatory Visit: Payer: Medicare HMO

## 2013-09-10 ENCOUNTER — Inpatient Hospital Stay (HOSPITAL_COMMUNITY): Payer: Medicare PPO

## 2013-09-10 DIAGNOSIS — J438 Other emphysema: Secondary | ICD-10-CM

## 2013-09-10 LAB — BASIC METABOLIC PANEL
BUN: 22 mg/dL (ref 6–23)
CALCIUM: 7.9 mg/dL — AB (ref 8.4–10.5)
CO2: 38 mEq/L — ABNORMAL HIGH (ref 19–32)
Chloride: 90 mEq/L — ABNORMAL LOW (ref 96–112)
Creatinine, Ser: 0.59 mg/dL (ref 0.50–1.10)
GFR calc Af Amer: >90 mL/min (ref >90)
GFR, EST NON AFRICAN AMERICAN: 88 mL/min — AB (ref >90)
GLUCOSE: 121 mg/dL — AB (ref 70–99)
Potassium: 3.2 mEq/L — ABNORMAL LOW (ref 3.7–5.3)
Sodium: 137 mEq/L (ref 137–147)

## 2013-09-10 LAB — GLUCOSE, CAPILLARY
Glucose-Capillary: 109 mg/dL — ABNORMAL HIGH (ref 70–99)
Glucose-Capillary: 111 mg/dL — ABNORMAL HIGH (ref 70–99)
Glucose-Capillary: 115 mg/dL — ABNORMAL HIGH (ref 70–99)
Glucose-Capillary: 122 mg/dL — ABNORMAL HIGH (ref 70–99)
Glucose-Capillary: 123 mg/dL — ABNORMAL HIGH (ref 70–99)
Glucose-Capillary: 97 mg/dL (ref 70–99)

## 2013-09-10 LAB — DIFFERENTIAL
BASOS ABS: 0 10*3/uL (ref 0.0–0.1)
Basophils Relative: 0 % (ref 0–1)
EOS ABS: 0 10*3/uL (ref 0.0–0.7)
Eosinophils Relative: 0 % (ref 0–5)
Lymphocytes Relative: 6 % — ABNORMAL LOW (ref 12–46)
Lymphs Abs: 1.7 10*3/uL (ref 0.7–4.0)
MONOS PCT: 6 % (ref 3–12)
Monocytes Absolute: 1.7 10*3/uL — ABNORMAL HIGH (ref 0.1–1.0)
Neutro Abs: 25.5 10*3/uL — ABNORMAL HIGH (ref 1.7–7.7)
Neutrophils Relative %: 88 % — ABNORMAL HIGH (ref 43–77)

## 2013-09-10 LAB — CBC
HCT: 30.5 % — ABNORMAL LOW (ref 36.0–46.0)
Hemoglobin: 10.3 g/dL — ABNORMAL LOW (ref 12.0–15.0)
MCH: 29.9 pg (ref 26.0–34.0)
MCHC: 33.8 g/dL (ref 30.0–36.0)
MCV: 88.7 fL (ref 78.0–100.0)
PLATELETS: 78 10*3/uL — AB (ref 150–400)
RBC: 3.44 MIL/uL — ABNORMAL LOW (ref 3.87–5.11)
RDW: 16.8 % — ABNORMAL HIGH (ref 11.5–15.5)
WBC: 28.9 10*3/uL — ABNORMAL HIGH (ref 4.0–10.5)

## 2013-09-10 LAB — MAGNESIUM: Magnesium: 1.8 mg/dL (ref 1.5–2.5)

## 2013-09-10 MED ORDER — POTASSIUM CHLORIDE 20 MEQ/15ML (10%) PO LIQD
40.0000 meq | Freq: Two times a day (BID) | ORAL | Status: DC
  Filled 2013-09-10 (×6): qty 30

## 2013-09-10 MED ORDER — JEVITY 1.2 CAL PO LIQD
1000.0000 mL | ORAL | Status: DC
  Administered 2013-09-10: 1000 mL

## 2013-09-10 MED ORDER — DEXTROSE 5 % IV SOLN
1.0000 g | INTRAVENOUS | Status: DC
  Administered 2013-09-11: 1 g via INTRAVENOUS
  Filled 2013-09-10 (×2): qty 1

## 2013-09-10 MED ORDER — FUROSEMIDE 10 MG/ML IJ SOLN
20.0000 mg | Freq: Every day | INTRAMUSCULAR | Status: DC
  Administered 2013-09-10 – 2013-09-11 (×2): 20 mg via INTRAVENOUS
  Filled 2013-09-10 (×3): qty 2

## 2013-09-10 MED ORDER — HYDROMORPHONE HCL PF 1 MG/ML IJ SOLN
0.5000 mg | INTRAMUSCULAR | Status: DC | PRN
  Administered 2013-09-10 – 2013-09-11 (×4): 0.5 mg via INTRAVENOUS
  Filled 2013-09-10 (×4): qty 1

## 2013-09-10 NOTE — Progress Notes (Signed)
Pt has flat affect and actions present as if patient does not want nursing care anymore. Pt refused all of her PO medicine and refuses to be turned. Pt immediatly returns to back. When adjusting patient in the bed so she was not leaning against the side rail, pt mouthed stop. This RN sat with patient for a few minutes and held her hand. Pt stared blankly at [employer]. When asked if patient needed anything, pt looked at RN for a minute then slowly shook her head no. Will continue to monitor.

## 2013-09-10 NOTE — Plan of Care (Signed)
Problem: Phase I Progression Outcomes Goal: EF % per last Echo/documented,Core Reminder form on chart Outcome: Completed/Met Date Met:  09/10/13 20-25%  Problem: Phase III Progression Outcomes Goal: Tolerating diet Outcome: Not Met (add Reason) Pt not eating  Problem: Problem: Diet/Nutrition Progression Goal: ADEQUATE NUTRITION Outcome: Not Met (add Reason) Pt refusing to eat

## 2013-09-10 NOTE — Progress Notes (Signed)
ANTIBIOTIC CONSULT NOTE  Pharmacy Consult for Cefepime Indication: HCAP  No Known Allergies  Patient Measurements: Height: 4\' 11"  (149.9 cm) Weight: 92 lb 13 oz (42.1 kg) IBW/kg (Calculated) : 43.2  Vital Signs: Temp: 98.4 F (36.9 C) (05/20 0421) Temp src: Axillary (05/20 0421) BP: 120/68 mmHg (05/20 0421) Pulse Rate: 106 (05/20 0421) Intake/Output from previous day: 05/19 0701 - 05/20 0700 In: 890 [P.O.:60; NG/GT:480; IV Piggyback:350] Out: 595 [Urine:575; Emesis/NG output:20]  Labs:  Recent Labs  09/08/13 0412 09/09/13 0355 09/10/13 0434  WBC 28.6* 27.4* 28.9*  HGB 10.4* 10.1* 10.3*  PLT 52* 63* 78*  CREATININE 0.80 0.69 0.59   Estimated Creatinine Clearance: 41 ml/min (by C-G formula based on Cr of 0.59).  Microbiology: Recent Results (from the past 720 hour(s))  URINE CULTURE     Status: None   Collection Time    08/12/13  4:07 PM      Result Value Ref Range Status   Urine Culture, Routine Culture, Urine   Final   Comment: ------------------------------------------------------------------------     CCU     Final - ===== COLONY COUNT: =====     >=100,000 COLONIES/ML     ENTEROBACTER AEROGENES          ------------------------------------------------------------------------      ENTEROBACTER AEROGENES             AMOX/CLAVULANIC                  MIC      Resistant       >=32 ug/ml        PIPERACILLIN/TAZO                MIC      Sensitive        <=4 ug/ml        IMIPENEM                         MIC      Sensitive          1 ug/ml        CEFAZOLIN                        MIC      Resistant            ug/ml        CEFTRIAXONE                      MIC      Sensitive        <=1 ug/ml        CEFTAZIDIME                      MIC      Sensitive        <=1 ug/ml        CEFEPIME                         MIC      Sensitive        <=1 ug/ml        GENTAMICIN                       MIC      Sensitive        <=1 ug/ml        TOBRAMYCIN  MIC       Sensitive        <=1 ug/ml        CIPROFLOXACIN                    MIC      Sensitive     <=0.25 ug/ml        LEVOFLOXACIN                     MIC      Sensitive     <=0.12 ug/ml        NITROFURANTOIN                   MIC      Indeterminate     64 ug/ml        TRIMETH/SULFA                    MIC      Sensitive       <=20 ug/ml     END OF REPORT  TECHNOLOGIST REVIEW     Status: None   Collection Time    08/26/13  9:07 AM      Result Value Ref Range Status   Technologist Review few lymphs, oc mono   Final  MRSA PCR SCREENING     Status: None   Collection Time    09/01/13  2:09 AM      Result Value Ref Range Status   MRSA by PCR NEGATIVE  NEGATIVE Final   Comment:            The GeneXpert MRSA Assay (FDA     approved for NASAL specimens     only), is one component of a     comprehensive MRSA colonization     surveillance program. It is not     intended to diagnose MRSA     infection nor to guide or     monitor treatment for     MRSA infections.    Medical History: Past Medical History  Diagnosis Date  . Diverticulitis   . 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    Medications:  Anti-infectives   Start     Dose/Rate Route Frequency Ordered Stop   09/07/13 1000  ceFEPIme (MAXIPIME) 1 g in dextrose 5 % 50 mL IVPB     1 g 100 mL/hr over 30 Minutes Intravenous Every 12 hours 09/07/13 0925     09/07/13 0930  vancomycin (VANCOCIN) IVPB 750 mg/150 ml premix  Status:  Discontinued     750 mg 150 mL/hr over 60 Minutes Intravenous Every 24 hours 09/07/13 0925 09/10/13 0926   08/31/13 1000  levofloxacin (LEVAQUIN) tablet 500 mg     500 mg Oral Daily 08/31/13 0936 09/06/13 1133   09/03/2013 1830  fluconazole (DIFLUCAN) tablet 200 mg  Status:  Discontinued     200 mg Oral Daily 08/23/2013 1720 09/08/13 1351   08/28/2013 1730  levofloxacin (LEVAQUIN) tablet 500 mg  Status:  Discontinued     500 mg Oral Daily 08/27/2013 1720 08/31/2013 1721   09/07/2013 1730   fluconazole (DIFLUCAN) 40 MG/ML suspension 200 mg  Status:  Discontinued     200 mg Oral Daily 09/08/2013 1721 08/29/2013 1731     Assessment: 41 yoF admitted 5/9 with weakness, 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  On admission WBC  0.3 with ANC 0.2; WBC increased s/p filgrastim on 5/12.  She has completed 7 days of empiric levofloxacin.  Pharmacy is now consulted to dose Vancomycin and Cefepime for HCAP.  D#4 IV abx inpatient (s/p 1 week PO levaquin) 5/9 >> fluconazole PO >> 5/18 5/10 >> levaquin PO >> 5/16 5/17 >> vancomycin >> 5/20 5/17 >> cefepime >>   Tmax: afebrile WBCs: 28.9 (increasing s/p Granix 5/12) Renal: SCr stable, CrCl ~ 11ml/min (C-G) and 62ml/min (n)  5/11: MRSA PCR negative  Goal of Therapy:  Appropriate abx dosing, eradication of infection.  Plan:   Reduce to Cefepime 1g IV q24h  Follow up renal fxn and culture results.  Ralene Bathe, PharmD, BCPS 09/10/2013, 12:58 PM  Pager: [phone]

## 2013-09-10 NOTE — Progress Notes (Signed)
NUTRITION FOLLOW UP  Intervention:   - Monitor magnesium, potassium, and phosphorus daily for at least 3 days - Increase Jevity 1.2 by 10 ml every 12 hours to goal rate of 50 ml/hr, provides 1440 kcal, 67 g protein, 968 ml free water.  - Recommend Regular, liberalized diet to encourage PO intake.  - Continue with appetite stimulant - Will continue to monitor  NUTRITION DIAGNOSIS:  Inadequate oral intake related to poor appetite as evidenced by pt report. Ongoing.   Goal:  Pt to consume >90% of meals/supplements - not met  New goals: 1. Refeeding labs WNL 2. 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 She has had a craniotomy and started chemo last week. She has had a poor appetite and has had to be given IVF multiple times over the past week. She comes in now for severe weakness and is found to be hypokalemic and pancytopenic. She will be admitted for K replacement, blood transfusion and IVF.   5/11: Pt has been seen by [facility] RD. Pt not detailed in answers - reports poor appetite for a long time, not eating food at home, only drinking 1-2 Boost/day. Denied any problems chewing or swallowing. Had episode of vomiting on Friday but denied any further vomiting since then. Denies any nausea today. Pt with severe cachexia in arms and hands, with moderate fat loss in temporal/orbital region. Pt has lost 15 pounds unintentionally in the past 2 months. RN reports the only thing pt has consumed today has been at small amount of oatmeal.   5/16: Family desires G-tube. Per chart, pt was scheduled for g-tube placement 5/15, however pt with low O2 sats and not a candidate for moderate sedation, NGT placed with tip in distal stomach. RD consulted to initiate feedings. RD spoke with RN via telephone. Pt is  eating barely anything. Ordered for Heart Healthy diet, Boost Plus TID, and 1 scoop Beneprotein TID. RN reports that she was able to get 1 scoop of Beneprotein into the patient this morning. Currently receiving marinol. Current weight is +18 lb admit weight. RD received consult for TF management and initiated feedings of Jevity 1.2 at 20 ml/hr via NGT. Do not advance until refeeding electrolytes (potassium, magnesium and phosphorus) are repleted  5/17: Patient is currently receiving Jevity 1.2 at 20 ml/hr with orders to not advance until refeeding labs are repleted. Patient is tolerating this well. She continues to receive meals, Boost Plus TID, and Beneprotein TID. However, intake of this is minimal. Patient is receiving KCl and Magnesium supplementation. Potassium is improved, but still low. Per RN, patient continues to receive Lasix, which is likely contributing to depletion of potassium.   5/18: Pt with low potassium and phosphorus that dropped overnight. Getting 568m of K phos neutral TID and 3 runs of KCl today. RN reports pt without TF residuals. Pt c/o burning pain in stomach. States she did not eat anything yesterday or today.  TF: Jevity 1.2 at 278mhr via NGT - provides 576 calories, 27g protein meeting 44% estimated calorie needs, 41% estimated protein needs  5/19: -Phosphorus now WNL.  Pt receiving KPhos  -Mg/K continue to remain low. Receiving potassium chloride, magnesium oxide and magnesium sulfate -Tolerating Jevity 1.2 at 20 ml/hr w/out residuals. No plans for advancement d/t low refeeding labs -Per discussion with RN,  pt is without appetite and has been refusing meals. Offered pt a variety of different food options-different snacks, sweets, supplements- all of which patient declined. Diet has not been liberalized, will continue to recommend; however it is unclear to whether this will prove beneficial d/t pt's current condition -Were able to transition pt off venti mask to nasal  cannula; however this does not appear to have improved PO intake -Continue with Marinol  5/20: -Phosphorus and Magnesium WNL. Will recommend conservative advancement of Jevity 1.2 to goal rate of 50 ml/hr and monitor refeeding labs  -K low but improving, receiving potassium chloride -Refusing meals. RN noted pt also refusing medications -Tolerating Jevity 1.2 at 20 ml/hr. No residuals per RN, however is having large amounts of loose stools -Family requesting PEG placement, likely within 1-2 days   Potassium  Date/Time Value Ref Range Status  09/10/2013  4:34 AM 3.2* 3.7 - 5.3 mEq/L Final  09/09/2013  3:55 AM 2.8* 3.7 - 5.3 mEq/L Final     CRITICAL RESULT CALLED TO, READ BACK BY AND VERIFIED WITH:     REINERT,L/4E _0  ON 09/09/13 BY KARCZEWSKI,S.  09/08/2013  9:20 AM 2.6* 3.7 - 5.3 mEq/L Final     CRITICAL RESULT CALLED TO, READ BACK BY AND VERIFIED WITH:     PHILLIPS,K. RN AT 0956 09/08/13 BARFIELD,T  08/26/2013  9:07 AM 2.9* 3.5 - 5.1 mEq/L Final  08/19/2013  8:32 AM 4.0  3.5 - 5.1 mEq/L Final  08/12/2013  1:50 PM 4.6  3.5 - 5.1 mEq/L Final    Phosphorus  Date/Time Value Ref Range Status  09/09/2013  3:55 AM 2.6  2.3 - 4.6 mg/dL Final  09/08/2013  9:20 AM 1.5* 2.3 - 4.6 mg/dL Final  09/06/2013  5:18 AM 2.6  2.3 - 4.6 mg/dL Final    Magnesium  Date/Time Value Ref Range Status  09/10/2013  4:34 AM 1.8  1.5 - 2.5 mg/dL Final  09/09/2013  3:55 AM 1.1* 1.5 - 2.5 mg/dL Final  09/07/2013  4:45 AM 1.5  1.5 - 2.5 mg/dL Final     Height: Ht Readings from Last 1 Encounters:  08/26/2013 _1  (1.499 m)    Weight Status:   Wt Readings from Last 1 Encounters:  09/10/13 92 lb 13 oz (42.1 kg)  Admit wt         92 lb 2.4 oz (41.8 kg)    Re-estimated needs:  Kcal: 1300-1500  Protein: 65-80 g  Fluid: 1.3-1.5L/day  Skin: stage II R ischium  MASD to R buttock  Diet Order: Cardiac   Intake/Output Summary (Last 24 hours) at 09/10/13 1222 Last data filed at 09/10/13 0400  Gross per 24  hour  Intake    580 ml  Output    595 ml  Net    -15 ml    Last BM: 5/16   Labs:   Recent Labs Lab 09/06/13 0518  09/07/13 0445 09/08/13 0412 09/08/13 0920 09/09/13 0355 09/10/13 0434  NA 138  --  140 139  --  139 137  K 2.2*  < > 2.7* 3.0* 2.6* 2.8* 3.2*  CL 102  --  98 93*  --  88* 90*  CO2 21  --  29 36*  --  42* 38*  BUN 25*  --  25* 23  --  24* 22  CREATININE 0.95  --  0.93 0.80  --  0.69 0.59  CALCIUM 8.3*  --  8.5 8.3*  --  7.8* 7.9*  MG 1.1*  --  1.5  --   --  1.1* 1.8  PHOS 2.6  --   --   --  1.5* 2.6  --   GLUCOSE 92  --  171* 125*  --  120* 121*  < > = values in this interval not displayed.  CBG (last 3)   Recent Labs  09/10/13 0433 09/10/13 0757 09/10/13 1146  GLUCAP 122* 109* 123*    Scheduled Meds: . allopurinol  100 mg Oral BID  . amiodarone  400 mg Oral Daily  . antiseptic oral rinse  15 mL Mouth Rinse q12n4p  . budesonide-formoterol  2 puff Inhalation BID  . carvedilol  3.125 mg Oral BID WC  . ceFEPime (MAXIPIME) IV  1 g Intravenous Q12H  . dronabinol  2.5 mg Oral BID AC  . feeding supplement (JEVITY 1.2 CAL)  1,000 mL Per Tube Q24H  . magnesium oxide  400 mg Per Tube BID  . multivitamin  5 mL Per Tube Daily  . pantoprazole  40 mg Oral BID  . polyethylene glycol  17 g Oral Daily  . potassium chloride  40 mEq Oral BID  . sucralfate  1 g Oral TID WC & HS   Atlee Abide MS RD LDN Clinical Dietitian [phone]

## 2013-09-10 NOTE — Progress Notes (Addendum)
[facility] PROGRESS NOTE  Rachael Wall OIZ:[id number] DOB: July 01, 1940 DOA: 08/23/2013 PCP: [name], MD Brief Narrative: 73 y.o. 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 She has had a craniotomy and started chemo week prior to admission. She presented with severe thrombocytopenia and hospital stay was complicated by transient A. fib which cardiology helped manage and subsequently patient normal sinus rhythm. Also complicated by acute on chronic systolic heart failure after requiring platelet transfusion.  Currently being weaned off of oxygen after diuresis.  The patient has had weakness and poor oral intake and currently is not meeting her requirements by mouth. Family would like PEG tube placement with improvement in condition.   Assessment/Plan:  Severe Pancytopenia, Chemotherapy-Induced  -5/10: WBC 0.2, Hb 6.1, Plt <5.  -5/11: WBC 0.4, Hb 9.5, Plt 75, given transfusion of 2 units of PRBCs and 2 units of pheresed platelets.  -5/12: WBC 1.1, Hb 10.8, plt 75.  - Patient received Neulasta after recent chemotherapy. - WBC up to 28K now but afebrile, could be related to Neulasta post chemo although unusually high -monitor clinically   Widely metastatic NSC lung CA -unclear if she will be a candidate for further chemo -will d/w Dr.Mohamed, daughter interested in discussing prognosis/life expectancy with Onc -D/w Dr.Mohamed who felt her prognosis is very poor and without any treatment now life expectancy probably less than 2 months. -Dr.Mohamed had offered the option of Palliative care/Hospice before starting chemo and they chose to start chemo   Acute on chronic systolic heart failure - Elevated BNP of more than 70,000. Patient subsequently started on Lasix.  - X-ray showed worsening pulmonary edema. - ECHO with EF of 20% - Patient diuresed at or near dry weight, lasix held now - repeat  CXR - Follow daily weights and I and O's,  - Continue carvedilol in context of patient with recent new onset A. Fib.  ? HCAP -doubt this, but has been on Vanc/Cefepime for 4 days now -Fu CXR, stop Vanc  New Onset A Fib with RVR  - Has converted back to NSR ; on amio and B blocker - Cardiology recommended amio and B blocker. - not an anticoagulation candidate given intracranial mets  Elevated Troponin/Demand ischemia -due to CHF/Afib -ECHO with EF 20-25%. Diffuse hypokinesis -Card recommending medical management, continue Coreg  Severe Protein-Caloric Malnutrition  -minimal PO intake only  -Nutrition consult.  - Continue to encourage PO intake. - Pt has NG tube. RD for tube feedings. - Multivitamin on board. - will check CXR and consider PEG in 1-2days once resp status improved  Adult failure to thrive -due to above, poor prognosis -needs discussion on Prognosis with Dr.Mohamed  Hypokalemia - replace PO and IV  Hypophosphatemia - Last level on check was 2.6.  - Will discontinue oral replacement  Hypomagnesemia - improved to 1.8 now  Hyponatremia  - Resolved with IVF's - Pt saline locked currently   Code Status: DNR Family Communication: no family at bedside, called and d/w daughter via telephone  Disposition Plan: daughter wants to take her home  Consultants:  Oncology  Interventional radiology  Procedures:  None  Antibiotics:  Cefepime   Vancomycin 5/17-5/20  HPI/Subjective: No events overnight, no Po intake  Objective: Filed Vitals:   09/10/13 0421  BP: 120/68  Pulse: 106  Temp: 98.4 F (36.9 C)  Resp: 24    Intake/Output Summary (Last 24 hours) at 09/10/13 1142 Last data filed at 09/10/13  0400  Gross per 24 hour  Intake    580 ml  Output    595 ml  Net    -15 ml   Filed Weights   09/08/13 0424 09/09/13 0412 09/10/13 0421  Weight: 42.6 kg (93 lb 14.7 oz) 42.3 kg (93 lb 4.1 oz) 42.1 kg (92 lb 13 oz)    Exam:   General:    alert and awake, will not talk or respond to any questions, uncomfortable appearing  Cardiovascular: RRR, no MRG  Respiratory: no wheezes, decreased breath sounds at bases.   Abdomen: soft, NT, ND  Musculoskeletal: no cyanosis or clubbing   Data Reviewed: Basic Metabolic Panel:  Recent Labs Lab 09/06/13 0518  09/07/13 0445 09/08/13 0412 09/08/13 0920 09/09/13 0355 09/10/13 0434  NA 138  --  140 139  --  139 137  K 2.2*  < > 2.7* 3.0* 2.6* 2.8* 3.2*  CL 102  --  98 93*  --  88* 90*  CO2 21  --  29 36*  --  42* 38*  GLUCOSE 92  --  171* 125*  --  120* 121*  BUN 25*  --  25* 23  --  24* 22  CREATININE 0.95  --  0.93 0.80  --  0.69 0.59  CALCIUM 8.3*  --  8.5 8.3*  --  7.8* 7.9*  MG 1.1*  --  1.5  --   --  1.1* 1.8  PHOS 2.6  --   --   --  1.5* 2.6  --   < > = values in this interval not displayed. Liver Function Tests: No results found for this basename: AST, ALT, ALKPHOS, BILITOT, PROT, ALBUMIN,  in the last 168 hours No results found for this basename: LIPASE, AMYLASE,  in the last 168 hours No results found for this basename: AMMONIA,  in the last 168 hours CBC:  Recent Labs Lab 09/06/13 0518 09/07/13 0445 09/08/13 0412 09/09/13 0355 09/10/13 0434  WBC 15.4* 23.0* 28.6* 27.4* 28.9*  NEUTROABS 11.0* 14.2* 27.4* 23.8* 25.5*  HGB 9.8* 10.8* 10.4* 10.1* 10.3*  HCT 28.1* 30.3* 30.7* 30.1* 30.5*  MCV 86.7 84.9 87.2 88.3 88.7  PLT 60* 62* 52* 63* 78*   Cardiac Enzymes: No results found for this basename: CKTOTAL, CKMB, CKMBINDEX, TROPONINI,  in the last 168 hours BNP (last 3 results)  Recent Labs  09/03/13 0404 09/06/13 0518  PROBNP 50074.0* >70000.0*   CBG:  Recent Labs Lab 09/09/13 1621 09/09/13 2022 09/10/13 0055 09/10/13 0433 09/10/13 0757  GLUCAP 113* 123* 115* 122* 109*    Recent Results (from the past 240 hour(s))  MRSA PCR SCREENING     Status: None   Collection Time    09/01/13  2:09 AM      Result Value Ref Range Status   MRSA by PCR  NEGATIVE  NEGATIVE Final   Comment:            The GeneXpert MRSA Assay (FDA     approved for NASAL specimens     only), is one component of a     comprehensive MRSA colonization     surveillance program. It is not     intended to diagnose MRSA     infection nor to guide or     monitor treatment for     MRSA infections.     Studies: No results found.  Scheduled Meds: . allopurinol  100 mg Oral BID  . amiodarone  400 mg Oral  Daily  . antiseptic oral rinse  15 mL Mouth Rinse q12n4p  . budesonide-formoterol  2 puff Inhalation BID  . carvedilol  3.125 mg Oral BID WC  . ceFEPime (MAXIPIME) IV  1 g Intravenous Q12H  . dronabinol  2.5 mg Oral BID AC  . feeding supplement (JEVITY 1.2 CAL)  1,000 mL Per Tube Q24H  . magnesium oxide  400 mg Per Tube BID  . multivitamin  5 mL Per Tube Daily  . pantoprazole  40 mg Oral BID  . polyethylene glycol  17 g Oral Daily  . potassium chloride  40 mEq Oral BID  . sucralfate  1 g Oral TID WC & HS   Continuous Infusions:    Time spent: > 35 minutes    Domenic Polite  [facility] Pager [phone] If 7PM-7AM, please contact night-coverage at [email or web address], password [facility] 09/10/2013, 11:42 AM  LOS: 11 days

## 2013-09-10 NOTE — Progress Notes (Signed)
Patient answering yes/no questions or nodding head. Morning meds brought to bedside, patient shaking head no. Asked if she wanted to take her po meds, states no. Will continue to offer meds throughout day.

## 2013-09-11 ENCOUNTER — Ambulatory Visit: Payer: Medicare HMO

## 2013-09-11 ENCOUNTER — Ambulatory Visit: Admission: RE | Admit: 2013-09-11 | Payer: Medicare HMO | Source: Ambulatory Visit | Admitting: Radiation Oncology

## 2013-09-11 DIAGNOSIS — E875 Hyperkalemia: Secondary | ICD-10-CM

## 2013-09-11 LAB — DIFFERENTIAL
BAND NEUTROPHILS: 0 % (ref 0–10)
BASOS PCT: 0 % (ref 0–1)
Basophils Absolute: 0 10*3/uL (ref 0.0–0.1)
Blasts: 0 %
EOS ABS: 0 10*3/uL (ref 0.0–0.7)
Eosinophils Relative: 0 % (ref 0–5)
LYMPHS PCT: 5 % — AB (ref 12–46)
Lymphs Abs: 1.4 10*3/uL (ref 0.7–4.0)
Metamyelocytes Relative: 0 %
Monocytes Absolute: 1.1 10*3/uL — ABNORMAL HIGH (ref 0.1–1.0)
Monocytes Relative: 4 % (ref 3–12)
Myelocytes: 0 %
NEUTROS ABS: 24.6 10*3/uL — AB (ref 1.7–7.7)
NEUTROS PCT: 91 % — AB (ref 43–77)
Promyelocytes Absolute: 0 %
nRBC: 0 /100 WBC

## 2013-09-11 LAB — GLUCOSE, CAPILLARY
GLUCOSE-CAPILLARY: 111 mg/dL — AB (ref 70–99)
Glucose-Capillary: 106 mg/dL — ABNORMAL HIGH (ref 70–99)
Glucose-Capillary: 125 mg/dL — ABNORMAL HIGH (ref 70–99)
Glucose-Capillary: 113 mg/dL — ABNORMAL HIGH (ref 70–99)
Glucose-Capillary: 91 mg/dL (ref 70–99)

## 2013-09-11 LAB — CBC
HEMATOCRIT: 30.5 % — AB (ref 36.0–46.0)
Hemoglobin: 10.1 g/dL — ABNORMAL LOW (ref 12.0–15.0)
MCH: 29.5 pg (ref 26.0–34.0)
MCHC: 33.1 g/dL (ref 30.0–36.0)
MCV: 89.2 fL (ref 78.0–100.0)
Platelets: 115 10*3/uL — ABNORMAL LOW (ref 150–400)
RBC: 3.42 MIL/uL — ABNORMAL LOW (ref 3.87–5.11)
RDW: 16.7 % — ABNORMAL HIGH (ref 11.5–15.5)
WBC: 27.1 10*3/uL — ABNORMAL HIGH (ref 4.0–10.5)

## 2013-09-11 MED ORDER — HYDROMORPHONE HCL PF 1 MG/ML IJ SOLN
0.5000 mg | INTRAMUSCULAR | Status: DC | PRN
  Administered 2013-09-11 – 2013-09-12 (×6): 1 mg via INTRAVENOUS
  Filled 2013-09-11 (×6): qty 1

## 2013-09-11 NOTE — Plan of Care (Signed)
Problem: Phase III Progression Outcomes Goal: Activity at appropriate level-compared to baseline (UP IN CHAIR FOR HEMODIALYSIS)  Outcome: Not Progressing End of life  Problem: Discharge Progression Outcomes Goal: Tolerating diet Outcome: Not Met (add Reason) End of life Goal: Activity appropriate for discharge plan Outcome: Not Progressing End of life

## 2013-09-11 NOTE — Progress Notes (Signed)
[facility] PROGRESS NOTE  Rachael Wall YWV:[id number] DOB: 02/20/1941 DOA: 09/02/2013 PCP: [name], MD Brief Narrative: 73 y.o. 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 She has had a craniotomy and started chemo week prior to admission. She presented with severe thrombocytopenia and hospital stay was complicated by transient A. fib which cardiology helped manage and subsequently patient normal sinus rhythm. Also complicated by acute on chronic systolic heart failure after requiring platelet transfusion.  Currently being weaned off of oxygen after diuresis.  The patient has had weakness and poor oral intake and currently is not meeting her requirements by mouth. Family would like PEG tube placement with improvement in condition.   Assessment/Plan:  Severe Pancytopenia, Chemotherapy-Induced  -5/10: WBC 0.2, Hb 6.1, Plt <5.  -5/11: WBC 0.4, Hb 9.5, Plt 75, given transfusion of 2 units of PRBCs and 2 units of pheresed platelets.  -5/12: WBC 1.1, Hb 10.8, plt 75.  - Patient received Neulasta after recent chemotherapy. - WBC up to 28K now but afebrile, could be related to Neulasta post chemo although unusually high -monitor clinically   Widely metastatic NSC lung CA -very unlikely that she will be a candidate for further chemo -daughter interested in discussing prognosis/life expectancy with Onc -On 5/20, i d/w  Dr.Mohamed who felt her prognosis is very poor and without any treatment now life expectancy probably less than 2 months. -Dr.Mohamed had offered the option of Palliative care/Hospice before starting chemo and they chose to start chemo  -Given ongoing decline, failure to thrive d/w daughter who is finally agreeable to meeting with Palliative care for goals of care  Acute on chronic systolic heart failure - Elevated BNP of more than 70,000. Patient subsequently started on Lasix.  -  X-ray showed worsening pulmonary edema. - ECHO with EF of 20% - Patient diuresed at or near dry weight, lasix held now - repeat CXR - Follow daily weights and I and O's,  - Continue carvedilol in context of patient with recent new onset A. Fib.  ? HCAP -doubt this, but has been on Vanc/Cefepime for 4 days now -Fu CXR, stop Vanc  New Onset A Fib with RVR  - Has converted back to NSR ; on amio and B blocker - Cardiology recommended amio and B blocker. - not an anticoagulation candidate given intracranial mets  Elevated Troponin/Demand ischemia -due to CHF/Afib -ECHO with EF 20-25%. Diffuse hypokinesis -Card recommending medical management, continue Coreg  Severe Protein-Caloric Malnutrition  -minimal PO intake only  -Nutrition consult.  - Continue to encourage PO intake. - Pt has NG tube. RD for tube feedings. - Multivitamin on board. - I strongly discouraged PEG due to ongoing decline, await Palliative eval  Adult failure to thrive -due to above, poor prognosis  Hypokalemia - replaced PO and IV  Hypophosphatemia - Last level on check was 2.6.  - Will discontinue oral replacement  Hypomagnesemia - improved to 1.8 now  Hyponatremia  - Resolved with IVF's - Pt saline locked currently   Code Status: DNR Family Communication: no family at bedside, called and d/w Apolonio Schneiders (daughter) via telephone  Disposition Plan: May need Residential Hospice  Consultants:  Oncology  Interventional radiology  Procedures:  None  Antibiotics:  Cefepime   Vancomycin 5/17-5/20  HPI/Subjective: No events overnight, no Po intake, will not communicate, stares and mumbles few words  Objective: Filed Vitals:   09/11/13 0559  BP: 147/78  Pulse: 120  Temp:  97.3 F (36.3 C)  Resp: 24    Intake/Output Summary (Last 24 hours) at 09/11/13 1059 Last data filed at 09/11/13 0700  Gross per 24 hour  Intake  760.3 ml  Output    800 ml  Net  -39.7 ml   Filed Weights    09/09/13 0412 09/10/13 0421 09/11/13 0559  Weight: 42.3 kg (93 lb 4.1 oz) 42.1 kg (92 lb 13 oz) 40.1 kg (88 lb 6.5 oz)    Exam:   General:   alert and awake, will not talk or respond to any questions, uncomfortable appearing  Cardiovascular: RRR, no MRG  Respiratory: no wheezes, decreased breath sounds at bases.   Abdomen: soft, NT, ND  Musculoskeletal: no cyanosis or clubbing   Data Reviewed: Basic Metabolic Panel:  Recent Labs Lab 09/06/13 0518  09/07/13 0445 09/08/13 0412 09/08/13 0920 09/09/13 0355 09/10/13 0434  NA 138  --  140 139  --  139 137  K 2.2*  < > 2.7* 3.0* 2.6* 2.8* 3.2*  CL 102  --  98 93*  --  88* 90*  CO2 21  --  29 36*  --  42* 38*  GLUCOSE 92  --  171* 125*  --  120* 121*  BUN 25*  --  25* 23  --  24* 22  CREATININE 0.95  --  0.93 0.80  --  0.69 0.59  CALCIUM 8.3*  --  8.5 8.3*  --  7.8* 7.9*  MG 1.1*  --  1.5  --   --  1.1* 1.8  PHOS 2.6  --   --   --  1.5* 2.6  --   < > = values in this interval not displayed. Liver Function Tests: No results found for this basename: AST, ALT, ALKPHOS, BILITOT, PROT, ALBUMIN,  in the last 168 hours No results found for this basename: LIPASE, AMYLASE,  in the last 168 hours No results found for this basename: AMMONIA,  in the last 168 hours CBC:  Recent Labs Lab 09/07/13 0445 09/08/13 0412 09/09/13 0355 09/10/13 0434 09/11/13 0353  WBC 23.0* 28.6* 27.4* 28.9* 27.1*  NEUTROABS 14.2* 27.4* 23.8* 25.5* 24.6*  HGB 10.8* 10.4* 10.1* 10.3* 10.1*  HCT 30.3* 30.7* 30.1* 30.5* 30.5*  MCV 84.9 87.2 88.3 88.7 89.2  PLT 62* 52* 63* 78* 115*   Cardiac Enzymes: No results found for this basename: CKTOTAL, CKMB, CKMBINDEX, TROPONINI,  in the last 168 hours BNP (last 3 results)  Recent Labs  09/03/13 0404 09/06/13 0518  PROBNP 50074.0* >70000.0*   CBG:  Recent Labs Lab 09/10/13 1146 09/10/13 1617 09/10/13 2059 09/11/13 0159 09/11/13 0745  GLUCAP 123* 111* 97 106* 125*    No results found for this  or any previous visit (from the past 240 hour(s)).   Studies: Dg Chest Port 1 View  09/10/2013   CLINICAL DATA:  Follow-up CHF  EXAM: PORTABLE CHEST - 1 VIEW  COMPARISON:  09/08/2013  FINDINGS: Vascular congestion, irregular interstitial thickening and patchy areas of airspace opacity, most evident on the right, appears slightly worsened in a right perihilar and medial lung base distribution when compared to the prior exam. Left basilar opacity is mildly improved, likely improved atelectasis. No other change. No pneumothorax.  Enteric tube is stable passing well into the stomach.  IMPRESSION: 1. Slight worsening of pulmonary edema evident in a right perihilar and medial lung base distribution. 2. Mild improvement in left lung base atelectasis. 3. No other change.   Electronically Signed  By: Lajean Manes M.D.   On: 09/10/2013 12:20    Scheduled Meds: . allopurinol  100 mg Oral BID  . amiodarone  400 mg Oral Daily  . antiseptic oral rinse  15 mL Mouth Rinse q12n4p  . budesonide-formoterol  2 puff Inhalation BID  . carvedilol  3.125 mg Oral BID WC  . ceFEPime (MAXIPIME) IV  1 g Intravenous Q24H  . dronabinol  2.5 mg Oral BID AC  . feeding supplement (JEVITY 1.2 CAL)  1,000 mL Per Tube Q24H  . furosemide  20 mg Intravenous Daily  . magnesium oxide  400 mg Per Tube BID  . multivitamin  5 mL Per Tube Daily  . pantoprazole  40 mg Oral BID  . polyethylene glycol  17 g Oral Daily  . potassium chloride  40 mEq Oral BID  . sucralfate  1 g Oral TID WC & HS   Continuous Infusions:    Time spent: > 35 minutes    Domenic Polite  [facility] Pager [phone] If 7PM-7AM, please contact night-coverage at [email or web address], password [facility] 09/11/2013, 10:59 AM  LOS: 12 days

## 2013-09-11 NOTE — Progress Notes (Signed)
Missed appointment secondary to hospitalization room 1409.

## 2013-09-11 NOTE — Progress Notes (Signed)
Nutrition Note   -Per discussion with MD, will hold off modifying/advancing tube feeding pending palliative care discussion with family tomorrow 5/22.   Will continue to monitor per protocols. Please re-consult as needed.  Atlee Abide MS RD LDN Clinical Dietitian [phone]

## 2013-09-11 NOTE — Progress Notes (Signed)
I have met Ms. Bortle and her family. We will meet for full GOC tomorrow 1300. Briefly discussed tube feeding and will hold off until after meeting tomorrow - do not believe they will continue to desire TF after brief discussion today. Also increased pain medication frequency as they tell me they want her comfortable and this is priority. Please call if she has greater need for pain relief. Thank you for this consult.  Vinie Sill, NP Palliative Medicine Team Pager # [phone] (M-F 8a-5p) Team Phone # [phone] (Nights/Weekends)

## 2013-09-12 ENCOUNTER — Ambulatory Visit: Payer: Medicare HMO

## 2013-09-12 ENCOUNTER — Encounter: Payer: Self-pay | Admitting: Physician Assistant

## 2013-09-12 LAB — DIFFERENTIAL
BASOS PCT: 1 % (ref 0–1)
Basophils Absolute: 0.2 10*3/uL — ABNORMAL HIGH (ref 0.0–0.1)
EOS PCT: 0 % (ref 0–5)
Eosinophils Absolute: 0 10*3/uL (ref 0.0–0.7)
Lymphocytes Relative: 5 % — ABNORMAL LOW (ref 12–46)
Lymphs Abs: 1.1 10*3/uL (ref 0.7–4.0)
Monocytes Absolute: 1.5 10*3/uL — ABNORMAL HIGH (ref 0.1–1.0)
Monocytes Relative: 7 % (ref 3–12)
NEUTROS PCT: 87 % — AB (ref 43–77)
Neutro Abs: 18.5 10*3/uL — ABNORMAL HIGH (ref 1.7–7.7)

## 2013-09-12 MED ORDER — LORAZEPAM 2 MG/ML IJ SOLN: 1.0000 mg | INTRAMUSCULAR | Status: DC | PRN

## 2013-09-12 MED ORDER — MORPHINE SULFATE 2 MG/ML IJ SOLN
1.0000 mg | INTRAMUSCULAR | Status: DC | PRN
  Administered 2013-09-12: 1 mg via INTRAVENOUS
  Filled 2013-09-12: qty 1

## 2013-09-13 LAB — CBC
HCT: 29.3 % — ABNORMAL LOW (ref 36.0–46.0)
HEMOGLOBIN: 9.7 g/dL — AB (ref 12.0–15.0)
MCH: 30.1 pg (ref 26.0–34.0)
MCHC: 33.1 g/dL (ref 30.0–36.0)
MCV: 91 fL (ref 78.0–100.0)
Platelets: 169 10*3/uL (ref 150–400)
RBC: 3.22 MIL/uL — AB (ref 3.87–5.11)
RDW: 16.8 % — ABNORMAL HIGH (ref 11.5–15.5)
WBC: 21.3 10*3/uL — AB (ref 4.0–10.5)

## 2013-09-16 ENCOUNTER — Other Ambulatory Visit: Payer: Medicare HMO

## 2013-09-22 NOTE — Progress Notes (Signed)
10am Pt assessed by Two RNs No Respiratory  or Cardiac activity noted.  MD is aware of time of death. Family and Chaplain at bedside and emotional  Given to Pt's family.

## 2013-09-22 NOTE — Care Management Note (Addendum)
    Page 1 of 2   Sep 22, 2013     3:43:18 PM CARE MANAGEMENT NOTE September 22, 2013  Patient:  Rachael Wall, Rachael Wall   Account Number:  [id number]  Date Initiated:  09/02/2013  Documentation initiated by:  Dessa Phi  Subjective/Objective Assessment:   73 Y/O F ADMITTED W/PANCYTOPENIA.     Action/Plan:   FROM HOME.HAS PCP,PHARMACY.ACTIVE [facility] HHRN/PT.   Anticipated DC Date:  09/22/13   Anticipated DC Plan:  [place]  CM consult      [facility] Choice  Resumption Of Svcs/PTA Provider   Choice offered to / List presented to:             Status of service:  Completed, signed off Medicare Important Message given?  YES (If response is "NO", the following Medicare IM given date fields will be blank) Date Medicare IM given:  09/11/2013 Date Additional Medicare IM given:    Discharge Disposition:  EXPIRED  Per UR Regulation:  Reviewed for med. necessity/level of care/duration of stay  If discussed at [place] of Stay Meetings, dates discussed:   09/09/2013  09/11/2013    Comments:  Sep 22, 2013 Jedrek Dinovo RN,BSN NCM [phone] EXPIRED.  09/11/13 Harshini Trent RN,BSN NCM [phone] NEURO ENDORINE CA,LUNG/BRAIN METS,HCAP,CHF-NGT/PANDA-JEVITY,RD FOLLOWING.FTT,NO PO INTAKE,NOT COMMUNICATING,PER ONCOLOGY-POOR PROGNOSIS, NGT-JEVITY@ 20CC/HR,IV ABX,IV LASIX,NEBS.DTR TO TALK TO HER SIBLINGS & THEN DISCUSS W/MD.CURRENT D/C PLAN HOME [facility].NOTED MD POSSIBLE RESIDENTIAL HOSPICE.CSW NOTIFIED.CONTINUE TO MONITOR FOR PROGRESS.  09/09/13 Benigna Delisi RN,BSN NCM [phone] PANDA-JEVITY,NGT,RD FOLLOWING,MG RUN,K RUN,IV ABX X2,02 3LNC.CURRENT D/C PLAN [facility] FOLLOWING FOR HHC.  09/05/13 Braxtyn Bojarski RN,BSN NCM [phone] FOR PEG TUBE TODAY,RECOMMEND PT EVAL WHEN APPROPRIATE.[facility] FOLLOWING.AWAIT FINAL HHC ORDERS.  09/02/13 Brinnley Lacap RN,BSN NCM [phone] [facility] REP [place] RESUMPTION OF HHRN/PT ORDERS.

## 2013-09-22 NOTE — Progress Notes (Addendum)
Pt seen this am with daughter Mental status significantly worse from yesterday, obtunded, with labored respirations Full comfort is the focus now DNR Expect [place] demise, likely today IV morphine PRN, may need morphine gtt depending on response  Domenic Polite, MD [phone]

## 2013-09-22 NOTE — Progress Notes (Signed)
October 10, 2013 1000  Clinical Encounter Type  Visited With Family (dtr Coralyn Mark, SIL Kallie Locks)  Visit Type Death  Referral From Nurse  Spiritual Encounters  Spiritual Needs Grief support;Prayer  Stress Factors  Family Stress Factors Loss   Provided pastoral presence, bereavement support, and prayer at bedside per family request.  Dtr Apolonio Schneiders is en route.  Family is currently engaged in phone calls and other support for each other.  Family is aware of ongoing chaplain availability; please page if further support needed.  Thank you.  [place], [place]

## 2013-09-22 DEATH — deceased

## 2013-09-23 ENCOUNTER — Other Ambulatory Visit: Payer: Commercial Managed Care - HMO

## 2013-09-30 ENCOUNTER — Other Ambulatory Visit: Payer: Commercial Managed Care - HMO

## 2013-09-30 ENCOUNTER — Ambulatory Visit: Payer: Commercial Managed Care - HMO

## 2013-10-01 ENCOUNTER — Ambulatory Visit: Payer: Commercial Managed Care - HMO

## 2013-10-02 ENCOUNTER — Ambulatory Visit: Payer: Commercial Managed Care - HMO

## 2013-10-03 ENCOUNTER — Ambulatory Visit: Payer: Commercial Managed Care - HMO

## 2013-10-07 ENCOUNTER — Other Ambulatory Visit: Payer: Commercial Managed Care - HMO

## 2013-10-14 ENCOUNTER — Other Ambulatory Visit: Payer: Commercial Managed Care - HMO

## 2013-10-22 NOTE — H&P (Deleted)
Death Summary  Rachael Wall QMV:[id number] DOB: 1940/09/07 DOA: Sep 08, 2013  PCP: [name], MD  Admit date: 2013-09-08 Date of Death: 2013/09/21  Final Diagnoses:   Acute respiratory falure  Metastatic lung carcinoma   Pancytopenia   Hyponatremia   Emphysema lung   COPD   Protein-calorie malnutrition, severe   Non-small cell cancer of right lung   Hyperkalemia   Acute systolic CHF (congestive heart failure)   PAF (paroxysmal atrial fibrillation)   Hypomagnesemia   Hypophosphatemia   History of present illness:  73 y.o. 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 She has had a craniotomy and started chemo week prior to admission. She presented with severe thrombocytopenia and hospital stay was complicated by transient A. fib which cardiology helped manage and subsequently patient normal sinus rhythm. Also complicated by acute on chronic systolic heart failure after requiring platelet transfusion. Currently being weaned off of oxygen after diuresis. The patient has had weakness and very poor oral intake and failure to thrive  Hospital Course:   Widely metastatic [place] lung CA  -very unlikely to be a candidate for further chemo  -daughter interested in discussing prognosis/life expectancy with Onc  -On 5/20, i d/w Dr.Mohamed who felt her prognosis is very poor and without any treatment now life expectancy probably less than 2 months.  -Dr.Mohamed had offered the option of Palliative care/Hospice before starting chemo and they chose to start chemo  -Given ongoing decline, failure to thrive d/w daughter who is finally agreeable to meeting with Palliative care for goals of care, finally after much discussion, decision made for comfort care, she expired 09-22-22  Severe Pancytopenia, Chemotherapy-Induced  -5/10: WBC 0.2, Hb 6.1, Plt <5.  -5/11: WBC 0.4, Hb 9.5, Plt 75, given transfusion of 2 units of PRBCs  and 2 units of pheresed platelets.  -5/12: WBC 1.1, Hb 10.8, plt 75.  - Patient received Neulasta after recent chemotherapy.  - WBC up to 28K now but afebrile, could be related to Neulasta post chemo although unusually high   Acute on chronic systolic heart failure  - Elevated BNP of more than 70,000. Patient subsequently started on Lasix.  - X-ray showed worsening pulmonary edema.  - ECHO with EF of 20%  - Patient diuresed at or near dry weight  ? HCAP  -on broad spectrum abx  New Onset A Fib with RVR  - Has converted back to NSR ; on amio and B blocker  - Cardiology recommended amio and B blocker.  - not an anticoagulation candidate given intracranial mets   Elevated Troponin/Demand ischemia  -due to CHF/Afib  -ECHO with EF 20-25%. Diffuse hypokinesis  -Card recommended medical management  Severe Protein-Caloric Malnutrition  -minimal to no  PO intake -Nutrition consulted - Continue to encourage PO intake.  - Pt has NG tube. RD for tube feedings.  - Multivitamin on board.  - I strongly discouraged PEG due to ongoing decline, Palliative consulted, patient declined quickly started on morphine gtt and expired  Adult failure to thrive  -due to above, poor prognosis   Hypokalemia  - replaced PO and IV   Hypophosphatemia  - replaced  Hypomagnesemia  - improved    Time: 15min  Signed:  Harriett Azar  [facility] 10/02/2013, 7:27 AM

## 2013-10-22 NOTE — Discharge Summary (Addendum)
  Rachael Polite, MD Physician Signed Internal Medicine H&P Service date: 09-Oct-2013 7:27 AM   Death Summary   [name]:[id number] DOB: 04/17/1941 DOA: 26-Sep-2013  PCP: [name], MD  Admit date: 09-26-2013  Date of Death: October 09, 2013   Final Diagnoses:  Acute respiratory falure  Metastatic lung carcinoma  Pancytopenia  Hyponatremia  Emphysema lung  COPD  Protein-calorie malnutrition, severe  Non-small cell cancer of right lung  Hyperkalemia  Acute systolic CHF (congestive heart failure)  PAF (paroxysmal atrial fibrillation)  Hypomagnesemia  Hypophosphatemia  Stage 2 pressure ulcer     History of present illness:  73 y.o. 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 She has had a craniotomy and started chemo week prior to admission. She presented with severe thrombocytopenia and hospital stay was complicated by transient A. fib which cardiology helped manage and subsequently patient normal sinus rhythm. Also complicated by acute on chronic systolic heart failure after requiring platelet transfusion. Currently being weaned off of oxygen after diuresis. The patient has had weakness and very poor oral intake and failure to thrive    Hospital Course:  Widely metastatic [place] lung CA  -very unlikely to be a candidate for further chemo  -daughter interested in discussing prognosis/life expectancy with Onc  -On 5/20, i d/w Dr.Mohamed who felt her prognosis is very poor and without any treatment now life expectancy probably less than 2 months.  -Dr.Mohamed had offered the option of Palliative care/Hospice before starting chemo and they chose to start chemo  -Given ongoing decline, failure to thrive d/w daughter who is finally agreeable to meeting with Palliative care for goals of care, finally after much discussion, decision made for comfort care, she expired 10-10-22  Severe Pancytopenia,  Chemotherapy-Induced  -5/10: WBC 0.2, Hb 6.1, Plt <5.  -5/11: WBC 0.4, Hb 9.5, Plt 75, given transfusion of 2 units of PRBCs and 2 units of pheresed platelets.  -5/12: WBC 1.1, Hb 10.8, plt 75.  - Patient received Neulasta after recent chemotherapy.  - WBC up to 28K now but afebrile, could be related to Neulasta post chemo although unusually high  Acute on chronic systolic heart failure  - Elevated BNP of more than 70,000. Patient subsequently started on Lasix.  - X-ray showed worsening pulmonary edema.  - ECHO with EF of 20%  - Patient diuresed at or near dry weight  ? HCAP  -on broad spectrum abx  New Onset A Fib with RVR  - Has converted back to NSR ; on amio and B blocker  - Cardiology recommended amio and B blocker.  - not an anticoagulation candidate given intracranial mets  Elevated Troponin/Demand ischemia  -due to CHF/Afib  -ECHO with EF 20-25%. Diffuse hypokinesis  -Card recommended medical management  Severe Protein-Caloric Malnutrition  -minimal to no PO intake  -Nutrition consulted  - Continue to encourage PO intake.  - Pt has NG tube. RD for tube feedings.  - Multivitamin on board.  - I strongly discouraged PEG due to ongoing decline, Palliative consulted, patient declined quickly started on morphine gtt and expired  Adult failure to thrive  -due to above, poor prognosis  Hypokalemia  - replaced PO and IV  Hypophosphatemia  - replaced  Hypomagnesemia  - improved   Time: 28min   Signed:  Adelbert Gaspard  [facility]  10/02/2013, 7:27 AM

## 2013-12-16 ENCOUNTER — Other Ambulatory Visit: Payer: Self-pay | Admitting: Pharmacist

## 2014-08-16 IMAGING — CR DG CHEST 1V PORT
1 series · 1 of 1 positions shown · non-contrast
Comparison: 09/05/2013

CLINICAL DATA: Short of breath.  Weakness.

EXAM:
PORTABLE CHEST - 1 VIEW

[AP]
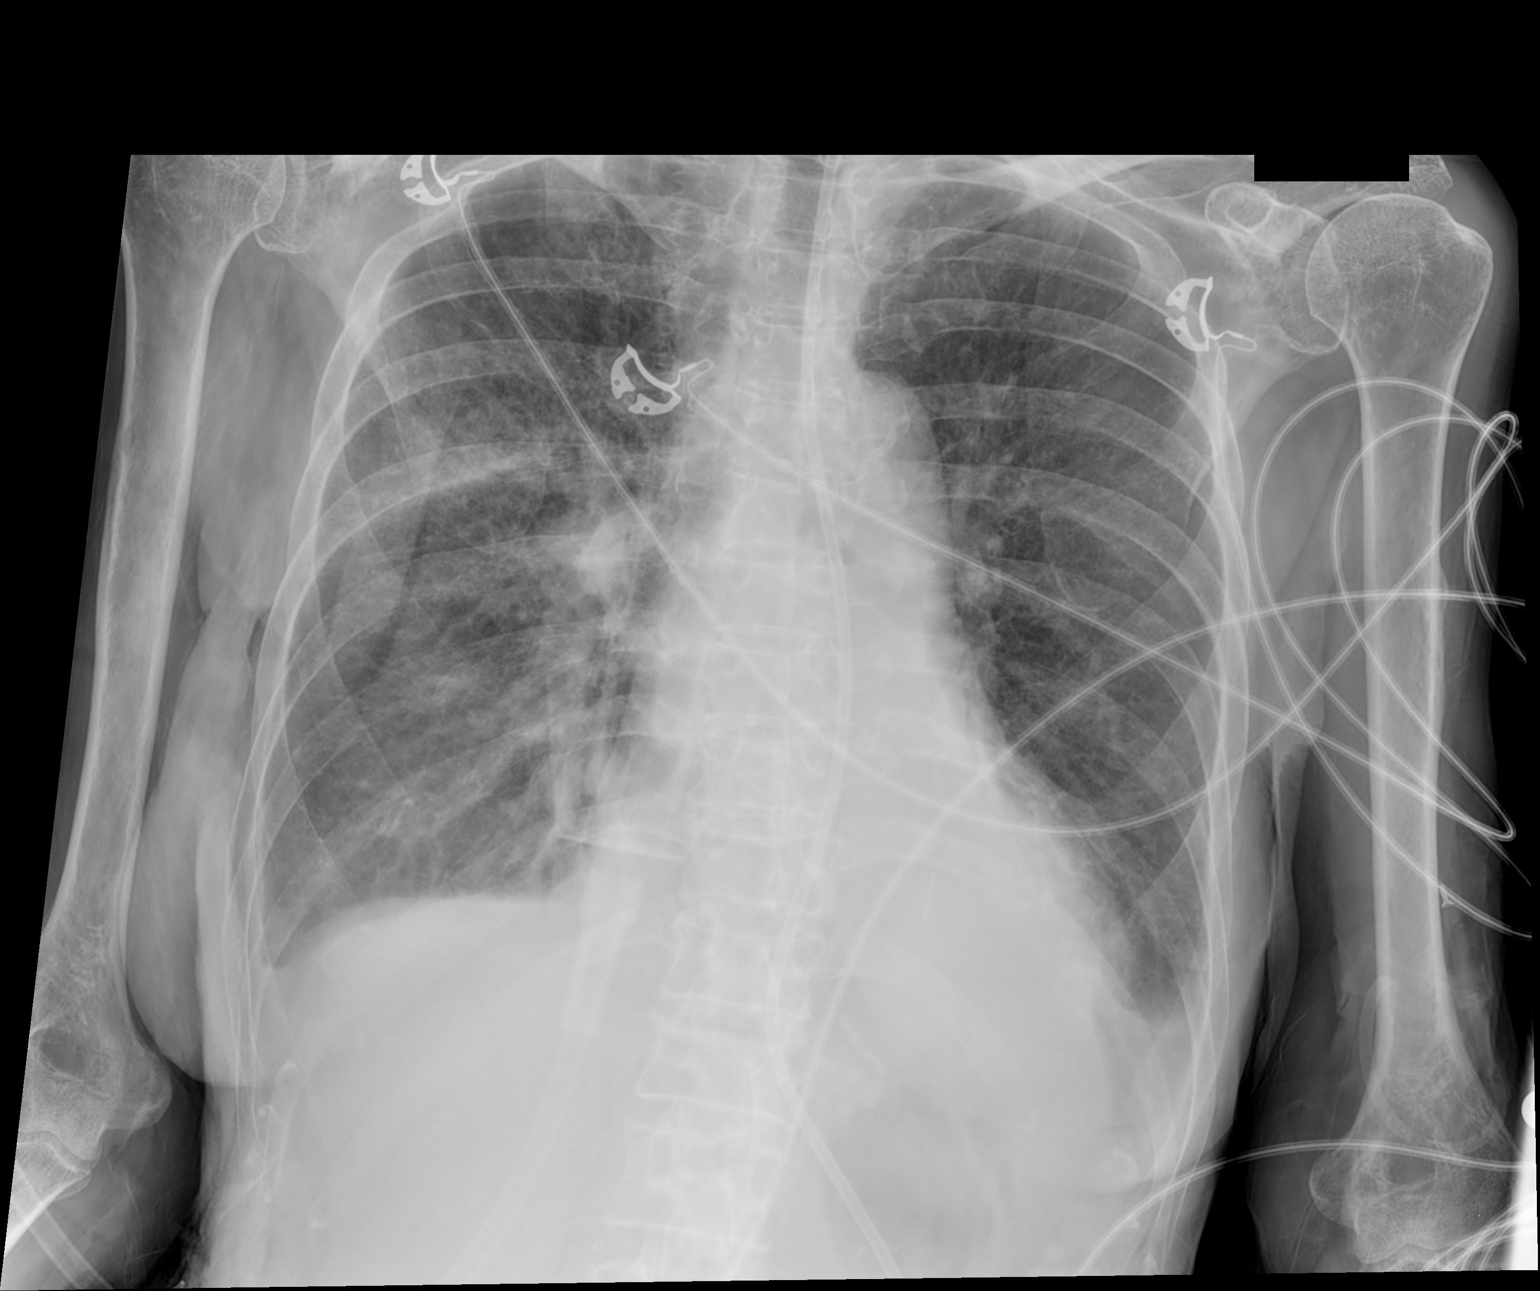

[1 of 1 positions shown; findings below may reference images not displayed]

FINDINGS: Irregular interstitial thickening and bilateral airspace opacities
have improved. There is still some residual airspace opacity in the
right upper lobe.

Denser opacity is noted in the medial left lung base superimposed on
the cardiac silhouette. This is without significant change. Small
pleural effusions are noted, which appear decreased in size from the
prior exam.

No pneumothorax.

Enteric tube passes well below the diaphragm and below the included
field of view, unchanged.
IMPRESSION: 1. Improved lung aeration. Findings most consistent with improved
pulmonary edema.
2. Persistent left lung base opacity, most likely atelectasis.
Infiltrate/pneumonia is possible.
3. Small pleural effusions, decreased from the prior study.
